# Patient Record
Sex: Female | Born: 1955
Health system: Southern US, Community
[De-identification: ages and names within clinical notes are randomized; demographics above are authoritative.]

## PROBLEM LIST (undated history)

## (undated) DIAGNOSIS — Z9989 Dependence on other enabling machines and devices: Secondary | ICD-10-CM

## (undated) DIAGNOSIS — J45909 Unspecified asthma, uncomplicated: Secondary | ICD-10-CM

## (undated) DIAGNOSIS — E119 Type 2 diabetes mellitus without complications: Secondary | ICD-10-CM

## (undated) DIAGNOSIS — I10 Essential (primary) hypertension: Secondary | ICD-10-CM

## (undated) DIAGNOSIS — I2510 Atherosclerotic heart disease of native coronary artery without angina pectoris: Secondary | ICD-10-CM

## (undated) DIAGNOSIS — M329 Systemic lupus erythematosus, unspecified: Secondary | ICD-10-CM

## (undated) DIAGNOSIS — I495 Sick sinus syndrome: Secondary | ICD-10-CM

## (undated) DIAGNOSIS — I639 Cerebral infarction, unspecified: Secondary | ICD-10-CM

## (undated) DIAGNOSIS — I480 Paroxysmal atrial fibrillation: Secondary | ICD-10-CM

## (undated) DIAGNOSIS — IMO0002 Reserved for concepts with insufficient information to code with codable children: Secondary | ICD-10-CM

## (undated) HISTORY — PX: CHOLECYSTECTOMY: SHX55

## (undated) HISTORY — DX: Sick sinus syndrome: I49.5

## (undated) HISTORY — PX: ATRIAL FIBRILLATION ABLATION: EP1191

---

## 2018-04-28 ENCOUNTER — Ambulatory Visit: Payer: Self-pay | Admitting: Family Medicine

## 2018-05-09 ENCOUNTER — Encounter (HOSPITAL_COMMUNITY): Payer: Self-pay | Admitting: Emergency Medicine

## 2018-05-09 ENCOUNTER — Ambulatory Visit (HOSPITAL_COMMUNITY)
Admission: EM | Admit: 2018-05-09 | Discharge: 2018-05-09 | Disposition: A | Discharge status: Home / Self Care | Payer: Self-pay | Attending: Family Medicine | Admitting: Family Medicine

## 2018-05-09 ENCOUNTER — Other Ambulatory Visit: Payer: Self-pay

## 2018-05-09 DIAGNOSIS — Z760 Encounter for issue of repeat prescription: Secondary | ICD-10-CM

## 2018-05-09 DIAGNOSIS — Z8673 Personal history of transient ischemic attack (TIA), and cerebral infarction without residual deficits: Secondary | ICD-10-CM

## 2018-05-09 DIAGNOSIS — I639 Cerebral infarction, unspecified: Secondary | ICD-10-CM

## 2018-05-09 HISTORY — DX: Reserved for concepts with insufficient information to code with codable children: IMO0002

## 2018-05-09 HISTORY — DX: Systemic lupus erythematosus, unspecified: M32.9

## 2018-05-09 HISTORY — DX: Cerebral infarction, unspecified: I63.9

## 2018-05-09 HISTORY — DX: Dependence on other enabling machines and devices: Z99.89

## 2018-05-09 HISTORY — DX: Type 2 diabetes mellitus without complications: E11.9

## 2018-05-09 HISTORY — DX: Unspecified asthma, uncomplicated: J45.909

## 2018-05-09 HISTORY — DX: Atherosclerotic heart disease of native coronary artery without angina pectoris: I25.10

## 2018-05-09 HISTORY — DX: Essential (primary) hypertension: I10

## 2018-05-09 MED ORDER — APIXABAN 5 MG PO TABS: 5.0000 mg | ORAL_TABLET | Freq: Two times a day (BID) | ORAL | 0 refills | Status: DC

## 2018-05-09 NOTE — ED Triage Notes (Signed)
Pt needs a refill of her Eliquis.  She states she just got out of prison about one month ago and was sent home with a 30 day supply.  She ran out last Thursday and [facility] and they will not be back until next Monday.

## 2018-05-11 NOTE — ED Provider Notes (Signed)
[facility]   [id number] 05/09/18 Arrival Time: 1058  ASSESSMENT & PLAN:  1. Medication refill   2. Cerebrovascular accident (CVA), unspecified mechanism (HCC)     Meds ordered this encounter  Medications  . apixaban (ELIQUIS) 5 MG TABS tablet    Sig: Take 1 tablet (5 mg total) by mouth 2 (two) times daily.    Dispense:  60 tablet    Refill:  0   She plans f/u with a new PCP. Here if needed. After Visit Summary given.   SUBJECTIVE: History from: patient. Stacey Burns is a 62 y.o. female who presents requesting medication refill - Eliquis. Has been taking for years after CVA. Recently released from prison and ran out a few days ago. No current concerns.  Current medical problems include: Past Medical History:  Diagnosis Date  . Asthma   . Coronary artery disease   . CPAP (continuous positive airway pressure) dependence   . Diabetes mellitus without complication (HCC)   . Hypertension   . Lupus (HCC)   . Stroke [facility](HCC)       No current facility-administered medications for this encounter.   Current Outpatient Medications:  .  acetaminophen (TYLENOL) 325 MG tablet, Take 650 mg by mouth every 8 (eight) hours as needed., Disp: , Rfl:  .  azaTHIOprine (IMURAN) 50 MG tablet, Take 175 mg by mouth daily., Disp: , Rfl:  .  baclofen (LIORESAL) 10 MG tablet, Take 10 mg by mouth at bedtime., Disp: , Rfl:  .  capsaicin (ZOSTRIX) 0.025 % cream, Apply topically 2 (two) times daily., Disp: , Rfl:  .  Dimethicone-Zinc Oxide-Vit A-D (A & D ZINC OXIDE) CREA, Apply topically., Disp: , Rfl:  .  furosemide (LASIX) 40 MG tablet, Take 40 mg by mouth daily., Disp: , Rfl:  .  insulin NPH Human (HUMULIN N,NOVOLIN N) 100 UNIT/ML injection, Inject into the skin., Disp: , Rfl:  .  insulin NPH-regular Human (70-30) 100 UNIT/ML injection, Inject into the skin., Disp: , Rfl:  .  lisinopril (PRINIVIL,ZESTRIL) 40 MG tablet, Take 40 mg by mouth daily., Disp: , Rfl:  .  magnesium  oxide (MAG-OX) 400 MG tablet, Take 400 mg by mouth 2 (two) times daily., Disp: , Rfl:  .  metoprolol tartrate (LOPRESSOR) 25 MG tablet, Take 12.5 mg by mouth 2 (two) times daily., Disp: , Rfl:  .  omeprazole (PRILOSEC) 20 MG capsule, Take 20 mg by mouth daily., Disp: , Rfl:  .  tiotropium (SPIRIVA HANDIHALER) 18 MCG inhalation capsule, Place 18 mcg into inhaler and inhale daily., Disp: , Rfl:  .  apixaban (ELIQUIS) 5 MG TABS tablet, Take 1 tablet (5 mg total) by mouth 2 (two) times daily., Disp: 60 tablet, Rfl: 0  ROS: As per HPI.   OBJECTIVE:  Vitals:   05/09/18 1234  BP: 139/60  Pulse: 62  Temp: 98 F (36.7 C)  TempSrc: Oral  SpO2: 97%    General appearance: alert; no distress Eyes: PERRLA; EOMI; conjunctiva normal Lungs: clear to auscultation bilaterally Heart: regular rate and rhythm Skin: warm and dry Neurologic: normal gait; normal symmetric reflexes Psychological: alert and cooperative; normal mood and affect  Labs: No results found for this or any previous visit. Labs Reviewed - No data to display  Allergies  Allergen Reactions  . Penicillins Swelling    Social History   Socioeconomic History  . Marital status: Widowed    Spouse name: Not on file  . Number of children: Not on file  .  Years of education: Not on file  . Highest education level: Not on file  Occupational History  . Not on file  Social Needs  . Financial resource strain: Not on file  . Food insecurity:    Worry: Not on file    Inability: Not on file  . Transportation needs:    Medical: Not on file    Non-medical: Not on file  Tobacco Use  . Smoking status: Never Smoker  . Smokeless tobacco: Never Used  Substance and Sexual Activity  . Alcohol use: Never    Frequency: Never  . Drug use: Never  . Sexual activity: Not on file  Lifestyle  . Physical activity:    Days per week: Not on file    Minutes per session: Not on file  . Stress: Not on file  Relationships  . Social  connections:    Talks on phone: Not on file    Gets together: Not on file    Attends religious service: Not on file    Active member of club or organization: Not on file    Attends meetings of clubs or organizations: Not on file    Relationship status: Not on file  . Intimate partner violence:    Fear of current or ex partner: Not on file    Emotionally abused: Not on file    Physically abused: Not on file    Forced sexual activity: Not on file  Other Topics Concern  . Not on file  Social History Narrative  . Not on file   History reviewed. No pertinent family history. Past Surgical History:  Procedure Laterality Date  . ATRIAL FIBRILLATION ABLATION       Mardella Layman, MD 05/11/18 [phone]

## 2018-07-22 ENCOUNTER — Ambulatory Visit: Payer: Self-pay | Admitting: Family Medicine

## 2018-07-27 ENCOUNTER — Encounter: Payer: Self-pay | Admitting: Family Medicine

## 2018-07-27 ENCOUNTER — Other Ambulatory Visit: Payer: Self-pay

## 2018-07-27 ENCOUNTER — Ambulatory Visit (INDEPENDENT_AMBULATORY_CARE_PROVIDER_SITE_OTHER): Payer: Self-pay | Admitting: Family Medicine

## 2018-07-27 VITALS — BP 124/68 | HR 66 | Temp 98.2°F | Ht 64.0 in | Wt 263.0 lb

## 2018-07-27 DIAGNOSIS — Z760 Encounter for issue of repeat prescription: Secondary | ICD-10-CM

## 2018-07-27 DIAGNOSIS — E119 Type 2 diabetes mellitus without complications: Secondary | ICD-10-CM

## 2018-07-27 DIAGNOSIS — E785 Hyperlipidemia, unspecified: Secondary | ICD-10-CM

## 2018-07-27 DIAGNOSIS — I10 Essential (primary) hypertension: Secondary | ICD-10-CM

## 2018-07-27 DIAGNOSIS — IMO0002 Reserved for concepts with insufficient information to code with codable children: Secondary | ICD-10-CM

## 2018-07-27 DIAGNOSIS — D689 Coagulation defect, unspecified: Secondary | ICD-10-CM

## 2018-07-27 DIAGNOSIS — R7309 Other abnormal glucose: Secondary | ICD-10-CM

## 2018-07-27 DIAGNOSIS — M329 Systemic lupus erythematosus, unspecified: Secondary | ICD-10-CM

## 2018-07-27 LAB — POCT GLYCOSYLATED HEMOGLOBIN (HGB A1C): Hemoglobin A1C: 7.3 % — AB (ref 4.0–5.6)

## 2018-07-27 MED ORDER — AZATHIOPRINE 50 MG PO TABS: 175.0000 mg | ORAL_TABLET | Freq: Every day | ORAL | 0 refills | Status: DC

## 2018-07-27 MED ORDER — LISINOPRIL 40 MG PO TABS: 40.0000 mg | ORAL_TABLET | Freq: Every day | ORAL | 0 refills | Status: DC

## 2018-07-27 MED ORDER — APIXABAN 5 MG PO TABS: 5.0000 mg | ORAL_TABLET | Freq: Two times a day (BID) | ORAL | 0 refills | Status: DC

## 2018-07-27 MED ORDER — INSULIN NPH ISOPHANE & REGULAR (70-30) 100 UNIT/ML ~~LOC~~ SUSP: SUBCUTANEOUS | 0 refills | Status: DC

## 2018-07-27 MED ORDER — FUROSEMIDE 40 MG PO TABS: 40.0000 mg | ORAL_TABLET | Freq: Every day | ORAL | 0 refills | Status: DC

## 2018-07-27 MED ORDER — METOPROLOL TARTRATE 25 MG PO TABS: 12.5000 mg | ORAL_TABLET | Freq: Two times a day (BID) | ORAL | 0 refills | Status: DC

## 2018-07-27 MED ORDER — BACLOFEN 10 MG PO TABS: 10.0000 mg | ORAL_TABLET | Freq: Every day | ORAL | 0 refills | Status: DC

## 2018-07-27 MED ORDER — TIOTROPIUM BROMIDE MONOHYDRATE 18 MCG IN CAPS: 18.0000 ug | ORAL_CAPSULE | Freq: Every day | RESPIRATORY_TRACT | 0 refills | Status: DC

## 2018-07-27 MED ORDER — INSULIN NPH (HUMAN) (ISOPHANE) 100 UNIT/ML ~~LOC~~ SUSP: SUBCUTANEOUS | 0 refills | Status: DC

## 2018-07-27 NOTE — Progress Notes (Signed)
Subjective:    Patient ID: Stacey Burns, female    DOB: [birth year], 63 y.o.   MRN: [id number]   CC: Establish care  HPI: Stacey Burns is a 63 year old female who presented to establish care and have her medications refilled.  Unfortunately, upon arrival discovered her insurance was not accepted at our clinic.  Informed patient that this would be an out-of-pocket cost for the visit and for labs necessary for refilling the medications, she understood and wanted to be seen.   She would like to have all of her medications refilled today since she is about to run out of most of her medications, and is stressed it will take about a month to establish with a new primary care physician.  She had been receiving medications through [facility], however recently obtained insurance and can no longer receive her medications through them.  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  Otherwise, states that she is doing well.  No specific complaints today.  Past medical history:   Diabetes: Checks her CBG 2 times daily, once in the morning and once in evening. Fasting morning glucose: not over 125 most times. Evening: 150's before dinner.  On a rare occasion, her glucoses dropped down to the mid 60s.  She becomes symptomatic and aware, is able to eat something and goes up without a problem.  Currently takes 28 units in the a.m. and 15 units in the p.m. of 70/30.  Has Novolin for a sliding scale that she will take if her CBG is over 150.  CVA: Previous CVA, and patient endorses she has a clotting disorder.  She is not sure which one, but has been told that she will be on Eliquis lifelong.  Congestive heart failure: Unknown EF. States she has had a cardiac ablation for an irregular heartbeat in the past.  Lupus/polyarteritis nodosa: Previously followed with rheumatology.  Taking azathioprine.  Will be  establishing with rheumatology in the future to have this more closely monitored.  Asthma: Longstanding history.  Non-smoker.  Occasional cough.  Hypertension: Well-controlled on lisinopril.  Sleep apnea: CPAP at night   Hyperlipidemia: Reports history of this, no lipid panel to review and is not currently on a statin.  Social history: Divorced, has 1 daughter, 27.  Non-smoker, no recreational drug use, no alcohol use.  Health maintenance: Mammogram and Pap smear in 2019.  Pneumonia vaccine in 2018.  Stress test in 2019.  Smoking status reviewed  Review of Systems Per HPI, also denies recent illness, fever, headache, changes in vision, chest pain, shortness of breath, abdominal pain, N/V/D, weakness   Patient Active Problem List   Diagnosis Date Noted  . Encounter for medication refill 07/27/2018  . Type 2 diabetes mellitus (HCC) 07/27/2018  . Essential hypertension 07/27/2018  . Lupus (HCC) 07/27/2018  . Clotting disorder (HCC) 07/27/2018  . Hyperlipidemia 07/27/2018     Objective:  BP 124/68   Pulse 66   Temp 98.2 F (36.8 C) (Oral)   Ht 5\' 4"  (1.626 m)   Wt 263 lb (119.3 kg)   SpO2 95%   BMI 45.14 kg/m  Vitals and nursing note reviewed  General: NAD, pleasant Cardiac: RRR, normal heart sounds Respiratory: CTAB, normal effort, no wheezing or rhonchi noted  Abdomen: soft large abdomen, nontender, nondistended, normoactive BS  Extremities: No lower extremity swelling noted on exam.  Or cyanosis. WWP. Skin: warm and dry,  no rashes noted Neuro: alert and oriented, no focal deficits Psych: normal affect  Assessment & Plan:   Encounter for medication refill Patient presented to establish care today, however upon arriving discovered her insurance was not accepted by our clinic.  She was informed that the visit and lab work necessary for refilling her medications will be an out-of-pocket cost, she understood and wanted to be seen.  After discussion with patient about  her chronic conditions, refilled her Eliquis, azathioprine, baclofen, Lasix, insulin 70/30, Novolin, lisinopril, Lopressor, and Spiriva.  Additionally obtained a CBC with differential and CMP necessary for monitoring these medications.  Will call patient if any abnormalities arise, and will advise any medication changes that need to be made.  Although she will not be establishing care with us, felt it was important for patient safety to give her a 30-day supply of her hypertension, diabetes, lupus, and anticoagulation therapy/medications. - Discussed case with Dr. Deirdre Priesthambliss and Dr. Pollie MeyerMcIntyre - Patient to establish with new PCP  Type 2 diabetes mellitus (HCC) A1c 7.3 today.  Currently uses 28 units in the a.m. and 15 units in p.m. of 70/30, with additional sliding scale of Novolin.  Tolerates this well without frequent episodes of hypoglycemia.  States she was initially started on this regimen because her diabetes had gotten severely out-of-control.  Discussed with patient that likely in her future could change regimen, consider metformin and GLP-1 agonist for cardiovascular protection given history of CVA and CAD. - Continue monitoring CBGs twice daily - Follow-up with new PCP  Essential hypertension BP 124/68 in the office today.  Well-controlled on lisinopril 40 mg and Lopressor. - Obtain CMP to monitor electrolytes and renal function  Lupus (HCC) Reports history of lupus, chronically on azathioprine. - Recommend establishing with rheumatology - Obtain CBC with differential for drug monitoring  Clotting disorder (HCC) Reports history of clotting disorder, unknown which type.  States she was told she will be on Eliquis lifelong. - Eliquis refill for 30 days as above - CMP to monitor ensure appropriate renal function for clearance  Hyperlipidemia Reported history.  Recommend obtaining lipid panel in the future.  Did not evaluate today due to out-of-pocket cost.  Establish care with new  primary care physician.  May call if any questions or concerns about medications refilled in the meantime.  Leticia PennaSamantha , DO Family Medicine Resident PGY-1

## 2018-07-27 NOTE — Patient Instructions (Signed)
It was wonderful meeting you today, I am sorry we are unable to take your insurance.  I have refilled the majority of your medications.  We have also obtained labs today, we will let you know the results of these shortly.  If anything is abnormal, we will let you know and advise against certain medications depending on the results.  Good luck on finding a primary care physician!  Please let us know if you have any questions or concerns in the meantime!

## 2018-07-27 NOTE — Assessment & Plan Note (Addendum)
BP 124/68 in the office today.  Well-controlled on lisinopril 40 mg and Lopressor. - Obtain CMP to monitor electrolytes and renal function

## 2018-07-27 NOTE — Assessment & Plan Note (Signed)
Patient presented to establish care today, however upon arriving discovered her insurance was not accepted by our clinic.  She was informed that the visit and lab work necessary for refilling her medications will be an out-of-pocket cost, she understood and wanted to be seen.  After discussion with patient about her chronic conditions, refilled her Eliquis, azathioprine, baclofen, Lasix, insulin 70/30, Novolin, lisinopril, Lopressor, and Spiriva.  Additionally obtained a CBC with differential and CMP necessary for monitoring these medications.  Will call patient if any abnormalities arise, and will advise any medication changes that need to be made.  Although she will not be establishing care with [place], felt it was important for patient safety to give her a 30-day supply of her hypertension, diabetes, lupus, and anticoagulation therapy/medications. - Discussed case with Dr. Deirdre Priest and Dr. Pollie Meyer - Patient to establish with new PCP

## 2018-07-27 NOTE — Assessment & Plan Note (Signed)
A1c 7.3 today.  Currently uses 28 units in the a.m. and 15 units in p.m. of 70/30, with additional sliding scale of Novolin.  Tolerates this well without frequent episodes of hypoglycemia.  States she was initially started on this regimen because her diabetes had gotten severely out-of-control.  Discussed with patient that likely in her future could change regimen, consider metformin and GLP-1 agonist for cardiovascular protection given history of CVA and CAD. - Continue monitoring CBGs twice daily - Follow-up with new PCP

## 2018-07-27 NOTE — Assessment & Plan Note (Addendum)
Reports history of clotting disorder, unknown which type.  States she was told she will be on Eliquis lifelong. - Eliquis refill for 30 days as above - CMP to monitor ensure appropriate renal function for clearance

## 2018-07-27 NOTE — Assessment & Plan Note (Signed)
Reports history of lupus, chronically on azathioprine. - Recommend establishing with rheumatology - Obtain CBC with differential for drug monitoring

## 2018-07-27 NOTE — Assessment & Plan Note (Addendum)
Reported history.  Recommend obtaining lipid panel in the future.  Did not evaluate today due to out-of-pocket cost.

## 2018-07-28 ENCOUNTER — Encounter: Payer: Self-pay | Admitting: Family Medicine

## 2018-07-28 LAB — CBC WITH DIFFERENTIAL/PLATELET
BASOS: 0 %
Basophils Absolute: 0 10*3/uL (ref 0.0–0.2)
EOS (ABSOLUTE): 0.1 10*3/uL (ref 0.0–0.4)
EOS: 1 %
HEMOGLOBIN: 13.1 g/dL (ref 11.1–15.9)
Hematocrit: 40.1 % (ref 34.0–46.6)
Immature Grans (Abs): 0 10*3/uL (ref 0.0–0.1)
Immature Granulocytes: 0 %
Lymphocytes Absolute: 1.5 10*3/uL (ref 0.7–3.1)
Lymphs: 25 %
MCH: 28.7 pg (ref 26.6–33.0)
MCHC: 32.7 g/dL (ref 31.5–35.7)
MCV: 88 fL (ref 79–97)
MONOS ABS: 0.6 10*3/uL (ref 0.1–0.9)
Monocytes: 10 %
NEUTROS PCT: 64 %
Neutrophils Absolute: 3.8 10*3/uL (ref 1.4–7.0)
Platelets: 216 10*3/uL (ref 150–450)
RBC: 4.57 x10E6/uL (ref 3.77–5.28)
RDW: 14.6 % (ref 11.7–15.4)
WBC: 5.9 10*3/uL (ref 3.4–10.8)

## 2018-07-28 LAB — COMPREHENSIVE METABOLIC PANEL
ALK PHOS: 101 IU/L (ref 39–117)
ALT: 13 IU/L (ref 0–32)
AST: 19 IU/L (ref 0–40)
Albumin/Globulin Ratio: 0.9 — ABNORMAL LOW (ref 1.2–2.2)
Albumin: 3.4 g/dL — ABNORMAL LOW (ref 3.8–4.8)
BUN/Creatinine Ratio: 21 (ref 12–28)
BUN: 18 mg/dL (ref 8–27)
Bilirubin Total: 0.3 mg/dL (ref 0.0–1.2)
CALCIUM: 8.8 mg/dL (ref 8.7–10.3)
CO2: 24 mmol/L (ref 20–29)
CREATININE: 0.87 mg/dL (ref 0.57–1.00)
Chloride: 105 mmol/L (ref 96–106)
GFR calc Af Amer: 83 mL/min/{1.73_m2} (ref >59)
GFR, EST NON AFRICAN AMERICAN: 72 mL/min/{1.73_m2} (ref >59)
GLUCOSE: 127 mg/dL — AB (ref 65–99)
Globulin, Total: 3.6 g/dL (ref 1.5–4.5)
Potassium: 4.7 mmol/L (ref 3.5–5.2)
Sodium: 141 mmol/L (ref 134–144)
Total Protein: 7 g/dL (ref 6.0–8.5)

## 2018-07-29 ENCOUNTER — Other Ambulatory Visit: Payer: Self-pay | Admitting: Family Medicine

## 2018-07-29 ENCOUNTER — Telehealth: Payer: Self-pay

## 2018-07-29 ENCOUNTER — Telehealth: Payer: Self-pay | Admitting: *Deleted

## 2018-07-29 DIAGNOSIS — M329 Systemic lupus erythematosus, unspecified: Secondary | ICD-10-CM

## 2018-07-29 MED ORDER — LISINOPRIL 40 MG PO TABS: 40.0000 mg | ORAL_TABLET | Freq: Every day | ORAL | 0 refills | Status: DC

## 2018-07-29 MED ORDER — AZATHIOPRINE 50 MG PO TABS: 175.0000 mg | ORAL_TABLET | Freq: Every day | ORAL | 0 refills | Status: DC

## 2018-07-29 MED ORDER — INSULIN NPH (HUMAN) (ISOPHANE) 100 UNIT/ML ~~LOC~~ SUSP: SUBCUTANEOUS | 0 refills | Status: DC

## 2018-07-29 MED ORDER — BACLOFEN 10 MG PO TABS: 10.0000 mg | ORAL_TABLET | Freq: Every day | ORAL | 0 refills | Status: DC

## 2018-07-29 MED ORDER — FUROSEMIDE 40 MG PO TABS: 40.0000 mg | ORAL_TABLET | Freq: Every day | ORAL | 0 refills | Status: DC

## 2018-07-29 MED ORDER — TIOTROPIUM BROMIDE MONOHYDRATE 18 MCG IN CAPS: 18.0000 ug | ORAL_CAPSULE | Freq: Every day | RESPIRATORY_TRACT | 0 refills | Status: DC

## 2018-07-29 MED ORDER — METOPROLOL TARTRATE 25 MG PO TABS: 12.5000 mg | ORAL_TABLET | Freq: Two times a day (BID) | ORAL | 0 refills | Status: DC

## 2018-07-29 MED ORDER — INSULIN NPH ISOPHANE & REGULAR (70-30) 100 UNIT/ML ~~LOC~~ SUSP: SUBCUTANEOUS | 0 refills | Status: DC

## 2018-07-29 NOTE — Progress Notes (Signed)
Visit with patient on 2/12, refilled her medications electronically with exception of Eliquis that was printed.  Informed today that she needed them printed instead because her medications have to go through a "prision process" in order for her to receive them.  Will reorder and print prescriptions, will leave at the front desk for her son to pick them up next week.  Allayne Stack, DO   Meds ordered this encounter  Medications  . azaTHIOprine (IMURAN) 50 MG tablet    Sig: Take 3.5 tablets (175 mg total) by mouth daily.    Dispense:  105 tablet    Refill:  0  . baclofen (LIORESAL) 10 MG tablet    Sig: Take 1 tablet (10 mg total) by mouth at bedtime.    Dispense:  30 each    Refill:  0  . furosemide (LASIX) 40 MG tablet    Sig: Take 1 tablet (40 mg total) by mouth daily.    Dispense:  30 tablet    Refill:  0  . insulin NPH Human (HUMULIN N,NOVOLIN N) 100 UNIT/ML injection    Sig: Inject into skin, sliding scale.    Dispense:  10 mL    Refill:  0  . insulin NPH-regular Human (70-30) 100 UNIT/ML injection    Sig: 28 units with breakfast, 15 units with dinner    Dispense:  10 mL    Refill:  0  . lisinopril (PRINIVIL,ZESTRIL) 40 MG tablet    Sig: Take 1 tablet (40 mg total) by mouth daily.    Dispense:  30 tablet    Refill:  0  . metoprolol tartrate (LOPRESSOR) 25 MG tablet    Sig: Take 0.5 tablets (12.5 mg total) by mouth 2 (two) times daily.    Dispense:  30 tablet    Refill:  0  . tiotropium (SPIRIVA HANDIHALER) 18 MCG inhalation capsule    Sig: Place 1 capsule (18 mcg total) into inhaler and inhale daily.    Dispense:  30 capsule    Refill:  0

## 2018-07-29 NOTE — Telephone Encounter (Signed)
[facility] on [address] calls for clarification on Humulin N.  They need parameters for the sliding scale for insurance purposes.  Hanadi Stanly, Maryjo [place], CMA

## 2018-07-29 NOTE — Telephone Encounter (Signed)
Printed prescriptions, will bring them to front for patient's son to pick up next week.   Thank you!   Allayne Stack, DO

## 2018-07-29 NOTE — Telephone Encounter (Signed)
Pt called nurse line stating the medications prescribed to her at last visit need to be printed in order to be filled. Pt is on house arrest and her medications have to go through the "prisions process" in order for her to get them. Pt will have her son come by next week to pick them up. Please advise.

## 2018-08-01 ENCOUNTER — Other Ambulatory Visit: Payer: Self-pay

## 2018-08-01 NOTE — Telephone Encounter (Signed)
Also needs printed Rx for acetaminophen 325 mg tablets.  Ples Specter, RN [facility] RN)

## 2018-08-01 NOTE — Telephone Encounter (Signed)
I printed her prescriptions and put them at the front on Friday 2/14 (within the folder). I will add these additional prescriptions this afternoon when I am able to get to the clinic.   Allayne Stack, DO

## 2018-08-01 NOTE — Telephone Encounter (Signed)
Patient called. Needs all her prescriptions as printed prescriptions because they have to be taken to the [employer].  Please print all prescriptions and put up front. Patient is OUT of insulin NPH.  In addition, needs Magnesium Oxide, Precision Xtra glucose test strips, Insulin syringe with needles (Ulti-Care syr with short needles) also in printed Rx.  Call back is [phone]  Ples Specter, RN [facility] RN)

## 2018-08-02 ENCOUNTER — Other Ambulatory Visit: Payer: Self-pay | Admitting: Family Medicine

## 2018-08-02 DIAGNOSIS — M329 Systemic lupus erythematosus, unspecified: Secondary | ICD-10-CM

## 2018-08-02 MED ORDER — "INSULIN SYRINGE-NEEDLE U-100 30G X 5/16"" 0.3 ML MISC": 2.0000 | Freq: Two times a day (BID) | 2 refills | Status: AC

## 2018-08-02 MED ORDER — OMEPRAZOLE 20 MG PO CPDR: 20.0000 mg | DELAYED_RELEASE_CAPSULE | Freq: Every day | ORAL | 1 refills | Status: DC

## 2018-08-02 MED ORDER — INSULIN NPH ISOPHANE & REGULAR (70-30) 100 UNIT/ML ~~LOC~~ SUSP: SUBCUTANEOUS | 2 refills | Status: AC

## 2018-08-02 MED ORDER — ACETAMINOPHEN 325 MG PO TABS: 650.0000 mg | ORAL_TABLET | Freq: Three times a day (TID) | ORAL | 0 refills | Status: AC | PRN

## 2018-08-02 MED ORDER — METOPROLOL TARTRATE 25 MG PO TABS: 12.5000 mg | ORAL_TABLET | Freq: Two times a day (BID) | ORAL | 1 refills | Status: DC

## 2018-08-02 MED ORDER — BACLOFEN 10 MG PO TABS: 10.0000 mg | ORAL_TABLET | Freq: Every day | ORAL | 1 refills | Status: DC

## 2018-08-02 MED ORDER — TIOTROPIUM BROMIDE MONOHYDRATE 18 MCG IN CAPS: 18.0000 ug | ORAL_CAPSULE | Freq: Every day | RESPIRATORY_TRACT | 1 refills | Status: DC

## 2018-08-02 MED ORDER — INSULIN NPH (HUMAN) (ISOPHANE) 100 UNIT/ML ~~LOC~~ SUSP: SUBCUTANEOUS | 0 refills | Status: DC

## 2018-08-02 MED ORDER — AZATHIOPRINE 50 MG PO TABS: 175.0000 mg | ORAL_TABLET | Freq: Every day | ORAL | 1 refills | Status: AC

## 2018-08-02 MED ORDER — GLUCOSE BLOOD VI STRP: ORAL_STRIP | 12 refills | Status: AC

## 2018-08-02 MED ORDER — FUROSEMIDE 40 MG PO TABS: 40.0000 mg | ORAL_TABLET | Freq: Every day | ORAL | 1 refills | Status: DC

## 2018-08-02 MED ORDER — LISINOPRIL 40 MG PO TABS: 40.0000 mg | ORAL_TABLET | Freq: Every day | ORAL | 1 refills | Status: AC

## 2018-08-02 MED ORDER — MAGNESIUM OXIDE 400 MG PO TABS: 400.0000 mg | ORAL_TABLET | Freq: Two times a day (BID) | ORAL | 1 refills | Status: AC

## 2018-08-02 NOTE — Progress Notes (Signed)
Printed out majority of patient's prescriptions last week, did not pick up.  However patient states now she has scheduled a new primary care doctor visit in April, requesting a 30-month supply.  Additionally wanted prescription for Tylenol, Mag-Ox, glucose strips, and insulin syringes/needles.  Have put these up in the front.  Reordered and printed off prescriptions, for a 77-month supply.  I will not be refilling any additional medications that are not already on her stated med list.  Allayne Stack, DO  Family Medicine PGY-1   Meds ordered this encounter  Medications  . acetaminophen (TYLENOL) 325 MG tablet    Sig: Take 2 tablets (650 mg total) by mouth every 8 (eight) hours as needed for up to 30 days.    Dispense:  45 tablet    Refill:  0  . azaTHIOprine (IMURAN) 50 MG tablet    Sig: Take 3.5 tablets (175 mg total) by mouth daily.    Dispense:  105 tablet    Refill:  1  . baclofen (LIORESAL) 10 MG tablet    Sig: Take 1 tablet (10 mg total) by mouth at bedtime.    Dispense:  30 each    Refill:  1  . furosemide (LASIX) 40 MG tablet    Sig: Take 1 tablet (40 mg total) by mouth daily.    Dispense:  30 tablet    Refill:  1  . insulin NPH Human (HUMULIN N,NOVOLIN N) 100 UNIT/ML injection    Sig: Inject into skin, sliding scale.  <150 0 U  150-199: 2U  200-249: 4 U 250-299: 6 U  300-349: 8 U  350-400: 10 U  > 400 call doc    Dispense:  10 mL    Refill:  0  . insulin NPH-regular Human (70-30) 100 UNIT/ML injection    Sig: 28 units with breakfast, 15 units with dinner    Dispense:  10 mL    Refill:  2  . lisinopril (PRINIVIL,ZESTRIL) 40 MG tablet    Sig: Take 1 tablet (40 mg total) by mouth daily.    Dispense:  30 tablet    Refill:  1  . magnesium oxide (MAG-OX) 400 MG tablet    Sig: Take 1 tablet (400 mg total) by mouth 2 (two) times daily.    Dispense:  60 tablet    Refill:  1  . metoprolol tartrate (LOPRESSOR) 25 MG tablet    Sig: Take 0.5 tablets (12.5 mg total) by  mouth 2 (two) times daily.    Dispense:  30 tablet    Refill:  1  . omeprazole (PRILOSEC) 20 MG capsule    Sig: Take 1 capsule (20 mg total) by mouth daily.    Dispense:  30 capsule    Refill:  1  . tiotropium (SPIRIVA HANDIHALER) 18 MCG inhalation capsule    Sig: Place 1 capsule (18 mcg total) into inhaler and inhale daily.    Dispense:  30 capsule    Refill:  1  . glucose blood test strip    Sig: Use as instructed    Dispense:  100 each    Refill:  12  . Insulin Syringe-Needle U-100 30G X 5/16" 0.3 ML MISC    Sig: 2 each by Does not apply route 2 (two) times daily.    Dispense:  100 each    Refill:  2    Needs needles + syringe for NPH and Humulin, please prescribe appropriate size.

## 2018-08-02 NOTE — Telephone Encounter (Signed)
Please see orders only note.  

## 2018-08-02 NOTE — Telephone Encounter (Signed)
Patient aware that prescriptions are ready for pick up.  Ples Specter, RN [facility] RN)

## 2018-08-02 NOTE — Telephone Encounter (Signed)
Pt calls back to check the status.  She also request a 2 month supply as her new provider cant see her till April.  Dr. Annia Friendly contacted, she will be by At lunch today.  Pt request a callback after scripts are written. Ken Bonn, Maryjo [place], CMA

## 2018-08-29 ENCOUNTER — Other Ambulatory Visit: Payer: Self-pay

## 2018-08-29 MED ORDER — APIXABAN 5 MG PO TABS: 5.0000 mg | ORAL_TABLET | Freq: Two times a day (BID) | ORAL | 0 refills | Status: DC

## 2019-01-15 ENCOUNTER — Encounter (HOSPITAL_COMMUNITY): Payer: Self-pay | Admitting: *Deleted

## 2019-01-15 ENCOUNTER — Inpatient Hospital Stay (HOSPITAL_COMMUNITY)
Admission: EM | Admit: 2019-01-15 | Discharge: 2019-01-27 | DRG: 177 | Disposition: A | Discharge status: Home Health | Attending: Internal Medicine | Admitting: Internal Medicine

## 2019-01-15 ENCOUNTER — Other Ambulatory Visit: Payer: Self-pay

## 2019-01-15 ENCOUNTER — Emergency Department (HOSPITAL_COMMUNITY)

## 2019-01-15 DIAGNOSIS — Z9989 Dependence on other enabling machines and devices: Secondary | ICD-10-CM

## 2019-01-15 DIAGNOSIS — I10 Essential (primary) hypertension: Secondary | ICD-10-CM | POA: Insufficient documentation

## 2019-01-15 DIAGNOSIS — I110 Hypertensive heart disease with heart failure: Secondary | ICD-10-CM | POA: Diagnosis not present

## 2019-01-15 DIAGNOSIS — I480 Paroxysmal atrial fibrillation: Secondary | ICD-10-CM | POA: Diagnosis present

## 2019-01-15 DIAGNOSIS — M793 Panniculitis, unspecified: Secondary | ICD-10-CM | POA: Diagnosis present

## 2019-01-15 DIAGNOSIS — J1282 Pneumonia due to coronavirus disease 2019: Secondary | ICD-10-CM

## 2019-01-15 DIAGNOSIS — Z7901 Long term (current) use of anticoagulants: Secondary | ICD-10-CM | POA: Diagnosis not present

## 2019-01-15 DIAGNOSIS — E785 Hyperlipidemia, unspecified: Secondary | ICD-10-CM | POA: Diagnosis present

## 2019-01-15 DIAGNOSIS — Z880 Allergy status to penicillin: Secondary | ICD-10-CM

## 2019-01-15 DIAGNOSIS — U071 COVID-19: Secondary | ICD-10-CM | POA: Diagnosis not present

## 2019-01-15 DIAGNOSIS — J9601 Acute respiratory failure with hypoxia: Secondary | ICD-10-CM | POA: Diagnosis not present

## 2019-01-15 DIAGNOSIS — E119 Type 2 diabetes mellitus without complications: Secondary | ICD-10-CM | POA: Diagnosis present

## 2019-01-15 DIAGNOSIS — I5032 Chronic diastolic (congestive) heart failure: Secondary | ICD-10-CM | POA: Diagnosis not present

## 2019-01-15 DIAGNOSIS — Z803 Family history of malignant neoplasm of breast: Secondary | ICD-10-CM | POA: Diagnosis not present

## 2019-01-15 DIAGNOSIS — Z8673 Personal history of transient ischemic attack (TIA), and cerebral infarction without residual deficits: Secondary | ICD-10-CM

## 2019-01-15 DIAGNOSIS — Z79899 Other long term (current) drug therapy: Secondary | ICD-10-CM

## 2019-01-15 DIAGNOSIS — Z823 Family history of stroke: Secondary | ICD-10-CM

## 2019-01-15 DIAGNOSIS — D689 Coagulation defect, unspecified: Secondary | ICD-10-CM | POA: Diagnosis not present

## 2019-01-15 DIAGNOSIS — J440 Chronic obstructive pulmonary disease with acute lower respiratory infection: Secondary | ICD-10-CM | POA: Diagnosis present

## 2019-01-15 DIAGNOSIS — I4892 Unspecified atrial flutter: Secondary | ICD-10-CM | POA: Diagnosis not present

## 2019-01-15 DIAGNOSIS — E669 Obesity, unspecified: Secondary | ICD-10-CM | POA: Diagnosis not present

## 2019-01-15 DIAGNOSIS — W19XXXA Unspecified fall, initial encounter: Secondary | ICD-10-CM | POA: Diagnosis present

## 2019-01-15 DIAGNOSIS — I639 Cerebral infarction, unspecified: Secondary | ICD-10-CM | POA: Insufficient documentation

## 2019-01-15 DIAGNOSIS — Z6841 Body Mass Index (BMI) 40.0 and over, adult: Secondary | ICD-10-CM | POA: Diagnosis not present

## 2019-01-15 DIAGNOSIS — J1289 Other viral pneumonia: Secondary | ICD-10-CM | POA: Diagnosis not present

## 2019-01-15 DIAGNOSIS — M329 Systemic lupus erythematosus, unspecified: Secondary | ICD-10-CM | POA: Diagnosis present

## 2019-01-15 DIAGNOSIS — I2510 Atherosclerotic heart disease of native coronary artery without angina pectoris: Secondary | ICD-10-CM | POA: Diagnosis not present

## 2019-01-15 DIAGNOSIS — R0602 Shortness of breath: Secondary | ICD-10-CM

## 2019-01-15 DIAGNOSIS — K5730 Diverticulosis of large intestine without perforation or abscess without bleeding: Secondary | ICD-10-CM | POA: Diagnosis not present

## 2019-01-15 DIAGNOSIS — G4733 Obstructive sleep apnea (adult) (pediatric): Secondary | ICD-10-CM | POA: Diagnosis not present

## 2019-01-15 DIAGNOSIS — Z794 Long term (current) use of insulin: Secondary | ICD-10-CM | POA: Diagnosis not present

## 2019-01-15 DIAGNOSIS — R001 Bradycardia, unspecified: Secondary | ICD-10-CM | POA: Diagnosis not present

## 2019-01-15 DIAGNOSIS — J45909 Unspecified asthma, uncomplicated: Secondary | ICD-10-CM | POA: Insufficient documentation

## 2019-01-15 HISTORY — DX: Paroxysmal atrial fibrillation: I48.0

## 2019-01-15 LAB — CBC WITH DIFFERENTIAL/PLATELET
Abs Immature Granulocytes: 0.02 10*3/uL (ref 0.00–0.07)
Basophils Absolute: 0 10*3/uL (ref 0.0–0.1)
Basophils Relative: 0 %
Eosinophils Absolute: 0 10*3/uL (ref 0.0–0.5)
Eosinophils Relative: 0 %
HCT: 43.9 % (ref 36.0–46.0)
Hemoglobin: 14.1 g/dL (ref 12.0–15.0)
Immature Granulocytes: 0 %
Lymphocytes Relative: 11 %
Lymphs Abs: 0.6 10*3/uL — ABNORMAL LOW (ref 0.7–4.0)
MCH: 29.2 pg (ref 26.0–34.0)
MCHC: 32.1 g/dL (ref 30.0–36.0)
MCV: 90.9 fL (ref 80.0–100.0)
Monocytes Absolute: 0.5 10*3/uL (ref 0.1–1.0)
Monocytes Relative: 9 %
Neutro Abs: 4.5 10*3/uL (ref 1.7–7.7)
Neutrophils Relative %: 80 %
Platelets: 199 10*3/uL (ref 150–400)
RBC: 4.83 MIL/uL (ref 3.87–5.11)
RDW: 16.7 % — ABNORMAL HIGH (ref 11.5–15.5)
WBC: 5.7 10*3/uL (ref 4.0–10.5)
nRBC: 0 % (ref 0.0–0.2)

## 2019-01-15 LAB — COMPREHENSIVE METABOLIC PANEL
ALT: 21 U/L (ref 0–44)
AST: 40 U/L (ref 15–41)
Albumin: 2.5 g/dL — ABNORMAL LOW (ref 3.5–5.0)
Alkaline Phosphatase: 47 U/L (ref 38–126)
Anion gap: 9 (ref 5–15)
BUN: 10 mg/dL (ref 8–23)
CO2: 24 mmol/L (ref 22–32)
Calcium: 8.1 mg/dL — ABNORMAL LOW (ref 8.9–10.3)
Chloride: 104 mmol/L (ref 98–111)
Creatinine, Ser: 0.89 mg/dL (ref 0.44–1.00)
GFR calc Af Amer: >60 mL/min (ref >60)
GFR calc non Af Amer: >60 mL/min (ref >60)
Glucose, Bld: 108 mg/dL — ABNORMAL HIGH (ref 70–99)
Potassium: 3.7 mmol/L (ref 3.5–5.1)
Sodium: 137 mmol/L (ref 135–145)
Total Bilirubin: 0.9 mg/dL (ref 0.3–1.2)
Total Protein: 7.1 g/dL (ref 6.5–8.1)

## 2019-01-15 LAB — PROCALCITONIN: Procalcitonin: 0.29 ng/mL

## 2019-01-15 LAB — FERRITIN: Ferritin: 143 ng/mL (ref 11–307)

## 2019-01-15 LAB — ABO/RH: ABO/RH(D): B POS

## 2019-01-15 LAB — TROPONIN I (HIGH SENSITIVITY)
Troponin I (High Sensitivity): 7 ng/L (ref <18)
Troponin I (High Sensitivity): 7 ng/L (ref <18)

## 2019-01-15 LAB — D-DIMER, QUANTITATIVE: D-Dimer, Quant: 0.33 ug/mL-FEU (ref 0.00–0.50)

## 2019-01-15 LAB — LACTIC ACID, PLASMA
Lactic Acid, Venous: 1.5 mmol/L (ref 0.5–1.9)
Lactic Acid, Venous: 3.9 mmol/L (ref 0.5–1.9)

## 2019-01-15 LAB — SARS CORONAVIRUS 2 BY RT PCR (HOSPITAL ORDER, PERFORMED IN ~~LOC~~ HOSPITAL LAB): SARS Coronavirus 2: POSITIVE — AB

## 2019-01-15 LAB — FIBRINOGEN: Fibrinogen: 735 mg/dL — ABNORMAL HIGH (ref 210–475)

## 2019-01-15 LAB — HEMOGLOBIN A1C
Hgb A1c MFr Bld: 7.1 % — ABNORMAL HIGH (ref 4.8–5.6)
Mean Plasma Glucose: 157.07 mg/dL

## 2019-01-15 LAB — CBG MONITORING, ED: Glucose-Capillary: 173 mg/dL — ABNORMAL HIGH (ref 70–99)

## 2019-01-15 LAB — LIPASE, BLOOD: Lipase: 43 U/L (ref 11–51)

## 2019-01-15 LAB — TRIGLYCERIDES: Triglycerides: 91 mg/dL (ref <150)

## 2019-01-15 LAB — LACTATE DEHYDROGENASE: LDH: 333 U/L — ABNORMAL HIGH (ref 98–192)

## 2019-01-15 LAB — C-REACTIVE PROTEIN: CRP: 16.9 mg/dL — ABNORMAL HIGH (ref <1.0)

## 2019-01-15 MED ORDER — INSULIN NPH (HUMAN) (ISOPHANE) 100 UNIT/ML ~~LOC~~ SUSP
2.0000 [IU] | Freq: Two times a day (BID) | SUBCUTANEOUS | Status: DC
  Administered 2019-01-15 – 2019-01-16 (×2): 2 [IU] via SUBCUTANEOUS
  Filled 2019-01-15: qty 10

## 2019-01-15 MED ORDER — IOHEXOL 350 MG/ML SOLN
100.0000 mL | Freq: Once | INTRAVENOUS | Status: AC | PRN
  Administered 2019-01-15: 100 mL via INTRAVENOUS

## 2019-01-15 MED ORDER — SODIUM CHLORIDE 0.9% FLUSH: 3.0000 mL | INTRAVENOUS | Status: DC | PRN

## 2019-01-15 MED ORDER — INSULIN ASPART 100 UNIT/ML ~~LOC~~ SOLN
0.0000 [IU] | Freq: Every day | SUBCUTANEOUS | Status: DC
  Administered 2019-01-16: 5 [IU] via SUBCUTANEOUS
  Administered 2019-01-17 – 2019-01-19 (×3): 2 [IU] via SUBCUTANEOUS
  Administered 2019-01-20: 5 [IU] via SUBCUTANEOUS

## 2019-01-15 MED ORDER — TIOTROPIUM BROMIDE MONOHYDRATE 18 MCG IN CAPS: 18.0000 ug | ORAL_CAPSULE | Freq: Every day | RESPIRATORY_TRACT | Status: DC

## 2019-01-15 MED ORDER — ALBUTEROL SULFATE HFA 108 (90 BASE) MCG/ACT IN AERS
2.0000 | INHALATION_SPRAY | Freq: Four times a day (QID) | RESPIRATORY_TRACT | Status: DC | PRN
  Administered 2019-01-15: 2 via RESPIRATORY_TRACT
  Filled 2019-01-15: qty 6.7

## 2019-01-15 MED ORDER — SODIUM CHLORIDE 0.9% FLUSH
3.0000 mL | Freq: Two times a day (BID) | INTRAVENOUS | Status: DC
  Administered 2019-01-16 – 2019-01-26 (×22): 3 mL via INTRAVENOUS

## 2019-01-15 MED ORDER — SODIUM CHLORIDE 0.9 % IV BOLUS
1000.0000 mL | Freq: Once | INTRAVENOUS | Status: AC
  Administered 2019-01-15: 1000 mL via INTRAVENOUS

## 2019-01-15 MED ORDER — SODIUM CHLORIDE 0.9 % IV BOLUS: 500.0000 mL | Freq: Once | INTRAVENOUS | Status: DC

## 2019-01-15 MED ORDER — LORAZEPAM 2 MG/ML IJ SOLN
0.5000 mg | Freq: Once | INTRAMUSCULAR | Status: AC
  Administered 2019-01-15: 0.5 mg via INTRAVENOUS
  Filled 2019-01-15: qty 1

## 2019-01-15 MED ORDER — INSULIN ASPART 100 UNIT/ML ~~LOC~~ SOLN
0.0000 [IU] | Freq: Three times a day (TID) | SUBCUTANEOUS | Status: DC
  Administered 2019-01-16: 3 [IU] via SUBCUTANEOUS
  Administered 2019-01-17: 5 [IU] via SUBCUTANEOUS
  Administered 2019-01-17 (×2): 3 [IU] via SUBCUTANEOUS
  Administered 2019-01-18: 8 [IU] via SUBCUTANEOUS
  Administered 2019-01-18: 5 [IU] via SUBCUTANEOUS
  Administered 2019-01-18: 3 [IU] via SUBCUTANEOUS
  Administered 2019-01-19 (×2): 8 [IU] via SUBCUTANEOUS
  Administered 2019-01-19: 11 [IU] via SUBCUTANEOUS
  Administered 2019-01-20: 15 [IU] via SUBCUTANEOUS
  Administered 2019-01-20: 8 [IU] via SUBCUTANEOUS
  Administered 2019-01-20: 11 [IU] via SUBCUTANEOUS
  Administered 2019-01-21: 15 [IU] via SUBCUTANEOUS

## 2019-01-15 MED ORDER — ZOLPIDEM TARTRATE 5 MG PO TABS
5.0000 mg | ORAL_TABLET | Freq: Every evening | ORAL | Status: DC | PRN
  Administered 2019-01-15: 5 mg via ORAL
  Filled 2019-01-15: qty 1

## 2019-01-15 MED ORDER — OXYCODONE HCL 5 MG PO TABS
5.0000 mg | ORAL_TABLET | ORAL | Status: DC | PRN
  Administered 2019-01-15: 5 mg via ORAL
  Filled 2019-01-15: qty 1

## 2019-01-15 MED ORDER — GUAIFENESIN-DM 100-10 MG/5ML PO SYRP
10.0000 mL | ORAL_SOLUTION | ORAL | Status: DC | PRN
  Administered 2019-01-15 – 2019-01-26 (×7): 10 mL via ORAL
  Filled 2019-01-15 (×7): qty 10

## 2019-01-15 MED ORDER — SODIUM CHLORIDE 0.9 % IV SOLN: 250.0000 mL | INTRAVENOUS | Status: DC | PRN

## 2019-01-15 MED ORDER — SEMAGLUTIDE(0.25 OR 0.5MG/DOS) 2 MG/1.5ML ~~LOC~~ SOPN: 0.2000 mL | PEN_INJECTOR | SUBCUTANEOUS | Status: DC

## 2019-01-15 MED ORDER — FUROSEMIDE 20 MG PO TABS
20.0000 mg | ORAL_TABLET | Freq: Every day | ORAL | Status: DC
  Administered 2019-01-15: 20 mg via ORAL
  Filled 2019-01-15: qty 1

## 2019-01-15 MED ORDER — ACETAMINOPHEN 325 MG PO TABS
650.0000 mg | ORAL_TABLET | Freq: Once | ORAL | Status: AC
  Administered 2019-01-15: 650 mg via ORAL
  Filled 2019-01-15: qty 2

## 2019-01-15 MED ORDER — APIXABAN 5 MG PO TABS
5.0000 mg | ORAL_TABLET | Freq: Two times a day (BID) | ORAL | Status: DC
  Administered 2019-01-16 – 2019-01-27 (×24): 5 mg via ORAL
  Filled 2019-01-15 (×25): qty 1

## 2019-01-15 MED ORDER — SODIUM CHLORIDE 0.9% FLUSH: 3.0000 mL | Freq: Two times a day (BID) | INTRAVENOUS | Status: DC

## 2019-01-15 MED ORDER — HYDROCOD POLST-CPM POLST ER 10-8 MG/5ML PO SUER
5.0000 mL | Freq: Two times a day (BID) | ORAL | Status: DC | PRN
  Administered 2019-01-16: 5 mL via ORAL
  Filled 2019-01-15: qty 5

## 2019-01-15 MED ORDER — ACETAMINOPHEN 325 MG PO TABS
650.0000 mg | ORAL_TABLET | Freq: Four times a day (QID) | ORAL | Status: DC | PRN
  Administered 2019-01-15: 650 mg via ORAL
  Filled 2019-01-15: qty 2

## 2019-01-15 MED ORDER — BACLOFEN 10 MG PO TABS
10.0000 mg | ORAL_TABLET | Freq: Every day | ORAL | Status: DC
  Administered 2019-01-16 – 2019-01-26 (×12): 10 mg via ORAL
  Filled 2019-01-15 (×14): qty 1

## 2019-01-15 MED ORDER — ONDANSETRON HCL 4 MG/2ML IJ SOLN: 4.0000 mg | Freq: Four times a day (QID) | INTRAMUSCULAR | Status: DC | PRN

## 2019-01-15 MED ORDER — ONDANSETRON HCL 4 MG PO TABS
4.0000 mg | ORAL_TABLET | Freq: Four times a day (QID) | ORAL | Status: DC | PRN
  Administered 2019-01-15: 4 mg via ORAL
  Filled 2019-01-15: qty 1

## 2019-01-15 MED ORDER — AZATHIOPRINE 50 MG PO TABS
175.0000 mg | ORAL_TABLET | Freq: Every morning | ORAL | Status: DC
  Filled 2019-01-15: qty 4

## 2019-01-15 MED ORDER — MORPHINE SULFATE (PF) 2 MG/ML IV SOLN
2.0000 mg | Freq: Once | INTRAVENOUS | Status: AC
  Administered 2019-01-15: 2 mg via INTRAVENOUS
  Filled 2019-01-15: qty 1

## 2019-01-15 MED ORDER — UMECLIDINIUM BROMIDE 62.5 MCG/INH IN AEPB
1.0000 | INHALATION_SPRAY | Freq: Every day | RESPIRATORY_TRACT | Status: DC
  Administered 2019-01-16 – 2019-01-27 (×12): 1 via RESPIRATORY_TRACT
  Filled 2019-01-15 (×2): qty 7

## 2019-01-15 NOTE — ED Provider Notes (Signed)
[facility] EMERGENCY DEPARTMENT Provider Note   CSN: [id number] Arrival date & time:       History   Chief Complaint Chief Complaint  Patient presents with   Chest Pain    HPI Stacey Burns is a 63 y.o. female with history of asthma, CAD, diabetes mellitus, hypertension, lupus, CVA, clotting disorder, hyperlipidemia, paroxysmal A. fib/a flutter presents for evaluation of acute onset, progressively worsening shortness of breath, chest pains, abdominal pain for 1 week secondary to fall.  She reports that 1 week ago she fell while ambulating.  She denies any prodrome leading up to the fall including shortness of breath, chest pain, or lightheadedness.  She reports that she landed on her right side, no head injury or loss of consciousness.  Headaches or vision changes since then.  Since the fall she has had shortness of breath at rest, significant dyspnea on exertion, and exertional chest pain.  She has a hard time describing the pain but reports it is substernal and occurs with any sort of exertion or even ambulation.  She has had a few episodes of nonbilious emesis with mild blood streaking this week.  Also reports generalized abdominal pain since the fall, worse on the right side.  Reports intermittent palpitations.  She experienced chest pain just prior to arrival and took a sublingual nitroglycerin with resolution of her pain from 8/10 in severity to a 0/10.  She is currently anticoagulated on Eliquis, reports she has been compliant with all of her home medications.     The history is provided by the patient.    Past Medical History:  Diagnosis Date   Asthma    Coronary artery disease    CPAP (continuous positive airway pressure) dependence    Diabetes mellitus without complication (HCC)    Hypertension    Lupus (HCC)    Paroxysmal atrial fibrillation (HCC)    Stroke [facility])     Patient Active Problem List   Diagnosis Date Noted   Pneumonia  due to COVID-19 virus 01/15/2019   CPAP (continuous positive airway pressure) dependence    Coronary artery disease    Asthma    Hypertension    Paroxysmal atrial fibrillation (HCC)    Stroke (HCC)    Encounter for medication refill 07/27/2018   Type 2 diabetes mellitus (HCC) 07/27/2018   Essential hypertension 07/27/2018   Lupus (HCC) 07/27/2018   Clotting disorder (HCC) 07/27/2018   Hyperlipidemia 07/27/2018    Past Surgical History:  Procedure Laterality Date   ATRIAL FIBRILLATION ABLATION       OB History   No obstetric history on file.      Home Medications    Prior to Admission medications   Medication Sig Start Date End Date Taking? Authorizing Provider  acetaminophen (TYLENOL) 325 MG tablet Take 325 mg by mouth every 6 (six) hours as needed for pain. 01/11/19  Yes [provider]  albuterol (VENTOLIN HFA) 108 (90 Base) MCG/ACT inhaler Inhale 2 puffs into the lungs every 6 (six) hours as needed for wheezing or shortness of breath.   Yes [provider]  apixaban (ELIQUIS) 5 MG TABS tablet Take 1 tablet (5 mg total) by mouth 2 (two) times daily. 08/29/18  Yes Allayne Stack, DO  azaTHIOprine (IMURAN) 50 MG tablet Take 175 mg by mouth every morning. 01/11/19  Yes [provider]  baclofen (LIORESAL) 10 MG tablet Take 1 tablet (10 mg total) by mouth at bedtime. 08/02/18  Yes Leticia Penna  N, DO  furosemide (LASIX) 40 MG tablet Take 1 tablet (40 mg total) by mouth daily. Patient taking differently: Take 20 mg by mouth daily.  08/02/18  Yes Beard, Samantha N, DO  insulin NPH Human (HUMULIN N,NOVOLIN N) 100 UNIT/ML injection Inject into skin, sliding scale.  <150 0 U  150-199: 2U  200-249: 4 U 250-299: 6 U  300-349: 8 U  350-400: 10 U  > 400 call doc 08/02/18  Yes Leticia PennaBeard, Samantha N, DO  lisinopril (PRINIVIL,ZESTRIL) 40 MG tablet Take 1 tablet (40 mg total) by mouth daily. 08/02/18  Yes Leticia PennaBeard, Samantha N, DO  metoprolol tartrate  (LOPRESSOR) 25 MG tablet Take 0.5 tablets (12.5 mg total) by mouth 2 (two) times daily. 08/02/18  Yes Allayne StackBeard, Samantha N, DO  Semaglutide,0.25 or 0.5MG /DOS, (OZEMPIC, 0.25 OR 0.5 MG/DOSE,) 2 MG/1.5ML SOPN Inject 0.2 mLs into the skin every Saturday. 12/28/18  Yes [provider]  glucose blood test strip Use as instructed 08/02/18   Allayne StackBeard, Samantha N, DO  insulin NPH-regular Human (70-30) 100 UNIT/ML injection 28 units with breakfast, 15 units with dinner Patient not taking: Reported on 01/15/2019 08/02/18   Allayne StackBeard, Samantha N, DO  Insulin Syringe-Needle U-100 30G X 5/16" 0.3 ML MISC 2 each by Does not apply route 2 (two) times daily. 08/02/18   Allayne StackBeard, Samantha N, DO  omeprazole (PRILOSEC) 20 MG capsule Take 1 capsule (20 mg total) by mouth daily. Patient not taking: Reported on 01/15/2019 08/02/18   Allayne StackBeard, Samantha N, DO  tiotropium (SPIRIVA HANDIHALER) 18 MCG inhalation capsule Place 1 capsule (18 mcg total) into inhaler and inhale daily. Patient not taking: Reported on 01/15/2019 08/02/18 10/01/18  Allayne StackBeard, Samantha N, DO    Family History History reviewed. No pertinent family history.  Social History Social History   Tobacco Use   Smoking status: Never Smoker   Smokeless tobacco: Never Used  Substance Use Topics   Alcohol use: Never    Frequency: Never   Drug use: Never     Allergies   Penicillins   Review of Systems Review of Systems  Constitutional: Negative for chills and fever.  Respiratory: Positive for shortness of breath.   Cardiovascular: Positive for chest pain and palpitations.  Gastrointestinal: Positive for abdominal pain, diarrhea, nausea and vomiting.  Genitourinary: Negative for dysuria, frequency, hematuria and urgency.  Neurological: Negative for syncope, weakness, numbness and headaches.  All other systems reviewed and are negative.    Physical Exam Updated Vital Signs BP 116/62    Pulse 82    Temp 100 F (37.8 C) (Rectal)    Resp 16    Ht 5\' 4"  (1.626  m)    Wt 121.1 kg    SpO2 95%    BMI 45.83 kg/m   Physical Exam Vitals signs and nursing note reviewed.  Constitutional:      General: She is not in acute distress.    Appearance: She is well-developed. She is obese.  HENT:     Head: Normocephalic and atraumatic.  Eyes:     General:        Right eye: No discharge.        Left eye: No discharge.     Extraocular Movements: Extraocular movements intact.     Conjunctiva/sclera: Conjunctivae normal.     Pupils: Pupils are equal, round, and reactive to light.  Neck:     Musculoskeletal: Normal range of motion and neck supple.     Vascular: No JVD.     Trachea: No  tracheal deviation.  Cardiovascular:     Rate and Rhythm: Tachycardia present. Rhythm irregular.     Pulses:          Radial pulses are 2+ on the right side and 2+ on the left side.       Dorsalis pedis pulses are 2+ on the right side and 2+ on the left side.       Posterior tibial pulses are 2+ on the right side and 2+ on the left side.  Pulmonary:     Effort: Pulmonary effort is normal. Tachypnea present.     Comments: Examination limited due to body habitus, globally diminished breath sounds Chest:     Chest wall: Tenderness present.     Comments: Tenderness to palpation of the anterior chest wall, worse along the sternum with no deformity, crepitus, or ecchymosis. Abdominal:     General: Bowel sounds are normal. There is no distension.     Palpations: Abdomen is soft.     Tenderness: There is generalized abdominal tenderness and tenderness in the right upper quadrant and right lower quadrant. There is no guarding or rebound.  Musculoskeletal:     Right lower leg: No edema.     Left lower leg: No edema.  Skin:    General: Skin is warm and Burns.     Findings: No erythema.  Neurological:     General: No focal deficit present.     Mental Status: She is alert and oriented to person, place, and time.     Cranial Nerves: No cranial nerve deficit.     Motor: No weakness.       Comments: Fluent speech, no facial droop, moves extremities spontaneously without difficulty.  Psychiatric:        Behavior: Behavior normal.      ED Treatments / Results  Labs (all labs ordered are listed, but only abnormal results are displayed) Labs Reviewed  SARS CORONAVIRUS 2 ([facility]) - Abnormal; Notable for the following components:      Result Value   SARS Coronavirus 2 POSITIVE (*)    All other components within normal limits  CBC WITH DIFFERENTIAL/PLATELET - Abnormal; Notable for the following components:   RDW 16.7 (*)    Lymphs Abs 0.6 (*)    All other components within normal limits  COMPREHENSIVE METABOLIC PANEL - Abnormal; Notable for the following components:   Glucose, Bld 108 (*)    Calcium 8.1 (*)    Albumin 2.5 (*)    All other components within normal limits  CULTURE, BLOOD (ROUTINE X 2)  CULTURE, BLOOD (ROUTINE X 2)  LIPASE, BLOOD  LACTIC ACID, PLASMA  LACTIC ACID, PLASMA  D-DIMER, QUANTITATIVE (NOT AT [facility])  PROCALCITONIN  LACTATE DEHYDROGENASE  FERRITIN  TRIGLYCERIDES  FIBRINOGEN  C-REACTIVE PROTEIN  TROPONIN I (HIGH SENSITIVITY)  TROPONIN I (HIGH SENSITIVITY)    EKG EKG Interpretation  Date/Time:  Sunday January 15 2019 08:18:03 EDT Ventricular Rate:  88 PR Interval:    QRS Duration: 81 QT Interval:  352 QTC Calculation: 426 R Axis:   55 Text Interpretation:  Sinus rhythm Borderline T abnormalities, anterior leads No previous ECGs available Confirmed by Gareth Morgan [phone]) on 01/15/2019 12:41:21 PM   Radiology Ct Angio Chest Pe W And/or Wo Contrast  Result Date: 01/15/2019 CLINICAL DATA:  EMS call for CP and SHOB x 1 week . Pt reported Pain increased with movement. Pt has Dx of A-fib and a ablation the patient .  Pt given 324 ASA and one NGT SL . BP 120/70 prior to meds and BP 74/40 after meds. On arrival EMS reported BP 120/70. Pt reports relief of CP From meds. HR 80, CBG 137, 97.2  temp. EXAM: CT ANGIOGRAPHY CHEST CT ABDOMEN AND PELVIS WITH CONTRAST TECHNIQUE: Multidetector CT imaging of the chest was performed using the standard protocol during bolus administration of intravenous contrast. Multiplanar CT image reconstructions and MIPs were obtained to evaluate the vascular anatomy. Multidetector CT imaging of the abdomen and pelvis was performed using the standard protocol during bolus administration of intravenous contrast. CONTRAST:  100mL OMNIPAQUE IOHEXOL 350 MG/ML SOLN COMPARISON:  None. FINDINGS: CTA CHEST FINDINGS Cardiovascular: There is satisfactory opacification of the pulmonary arteries to the segmental level. There is no evidence of a pulmonary embolism. Heart is normal in size. No pericardial effusion. Minimal three-vessel coronary artery calcifications. Great vessels are normal in caliber. Mild aortic atherosclerosis. No dissection. Mediastinum/Nodes: Large hiatal hernia, lying to the right of the lower thoracic spine. Esophagus unremarkable. Trachea widely patent. Normal thyroid. No neck base, axillary, mediastinal or hilar masses or enlarged lymph nodes. Lungs/Pleura: Bilateral, peripheral ground-glass airspace opacities. Slight upper lobe predominance. No lung masses or suspicious nodules. No interstitial thickening. No pleural effusion or pneumothorax. Musculoskeletal: No fracture or acute finding. No osteoblastic or osteolytic lesions. Review of the MIP images confirms the above findings. CT ABDOMEN and PELVIS FINDINGS Hepatobiliary: Decreased attenuation of the liver consistent with fatty infiltration. No liver mass or focal lesion. Liver normal in size. Normal gallbladder. No bile duct dilation. Pancreas: Unremarkable. No pancreatic ductal dilatation or surrounding inflammatory changes. Spleen: Normal in size without focal abnormality. Adrenals/Urinary Tract: Adrenal glands are unremarkable. Kidneys are normal, without renal calculi, focal lesion, or hydronephrosis.  Bladder is unremarkable. Stomach/Bowel: Stomach unremarkable other than the large hiatal hernia. Small bowel and colon are normal in caliber. No wall thickening. No inflammation. There are colonic diverticula mostly along the left colon. No diverticulitis. Normal appendix visualized. Vascular/Lymphatic: Mild aortic atherosclerosis.  No aneurysm. No enlarged lymph nodes. Reproductive: Small partly calcified uterine fibroids. Uterus normal in overall size. No ovarian/adnexal masses. Other: No abdominal wall hernia or abnormality. No abdominopelvic ascites. Musculoskeletal: No fracture or acute finding. No osteoblastic or osteolytic lesions. Review of the MIP images confirms the above findings. IMPRESSION: CTA CHEST 1. No evidence of a pulmonary embolism. 2. Bilateral peripheral ground-glass airspace lung opacities with a slight upper lobe predominance. Findings consistent with multifocal infection. Consider atypical infection including viral infection. Noninfectious lung inflammation may also have this appearance. 3. No other acute findings in the chest. 4. Large hiatal hernia. CT ABDOMEN AND PELVIS 1. No acute findings within the abdomen or pelvis. 2. Hepatic steatosis. 3. Aortic atherosclerosis. 4. Scattered colonic diverticula without diverticulitis. Electronically Signed   By: Amie Portlandavid  Ormond M.D.   On: 01/15/2019 12:01   Ct Abdomen Pelvis W Contrast  Result Date: 01/15/2019 CLINICAL DATA:  EMS call for CP and SHOB x 1 week . Pt reported Pain increased with movement. Pt has Dx of A-fib and a ablation the patient . Pt given 324 ASA and one NGT SL . BP 120/70 prior to meds and BP 74/40 after meds. On arrival EMS reported BP 120/70. Pt reports relief of CP From meds. HR 80, CBG 137, 97.2 temp. EXAM: CT ANGIOGRAPHY CHEST CT ABDOMEN AND PELVIS WITH CONTRAST TECHNIQUE: Multidetector CT imaging of the chest was performed using the standard protocol during bolus administration of intravenous contrast. Multiplanar CT  image reconstructions and MIPs were obtained to evaluate the vascular anatomy. Multidetector CT imaging of the abdomen and pelvis was performed using the standard protocol during bolus administration of intravenous contrast. CONTRAST:  OMNIPAQUE IOHEXOL 350 MG/ML SOLN COMPARISON:  None. FINDINGS: CTA CHEST FINDINGS Cardiovascular: There is satisfactory opacification of the pulmonary arteries to the segmental level. There is no evidence of a pulmonary embolism. Heart is normal in size. No pericardial effusion. Minimal three-vessel coronary artery calcifications. Great vessels are normal in caliber. Mild aortic atherosclerosis. No dissection. Mediastinum/Nodes: Large hiatal hernia, lying to the right of the lower thoracic spine. Esophagus unremarkable. Trachea widely patent. Normal thyroid. No neck base, axillary, mediastinal or hilar masses or enlarged lymph nodes. Lungs/Pleura: Bilateral, peripheral ground-glass airspace opacities. Slight upper lobe predominance. No lung masses or suspicious nodules. No interstitial thickening. No pleural effusion or pneumothorax. Musculoskeletal: No fracture or acute finding. No osteoblastic or osteolytic lesions. Review of the MIP images confirms the above findings. CT ABDOMEN and PELVIS FINDINGS Hepatobiliary: Decreased attenuation of the liver consistent with fatty infiltration. No liver mass or focal lesion. Liver normal in size. Normal gallbladder. No bile duct dilation. Pancreas: Unremarkable. No pancreatic ductal dilatation or surrounding inflammatory changes. Spleen: Normal in size without focal abnormality. Adrenals/Urinary Tract: Adrenal glands are unremarkable. Kidneys are normal, without renal calculi, focal lesion, or hydronephrosis. Bladder is unremarkable. Stomach/Bowel: Stomach unremarkable other than the large hiatal hernia. Small bowel and colon are normal in caliber. No wall thickening. No inflammation. There are colonic diverticula mostly along the left  colon. No diverticulitis. Normal appendix visualized. Vascular/Lymphatic: Mild aortic atherosclerosis.  No aneurysm. No enlarged lymph nodes. Reproductive: Small partly calcified uterine fibroids. Uterus normal in overall size. No ovarian/adnexal masses. Other: No abdominal wall hernia or abnormality. No abdominopelvic ascites. Musculoskeletal: No fracture or acute finding. No osteoblastic or osteolytic lesions. Review of the MIP images confirms the above findings. IMPRESSION: CTA CHEST 1. No evidence of a pulmonary embolism. 2. Bilateral peripheral ground-glass airspace lung opacities with a slight upper lobe predominance. Findings consistent with multifocal infection. Consider atypical infection including viral infection. Noninfectious lung inflammation may also have this appearance. 3. No other acute findings in the chest. 4. Large hiatal hernia. CT ABDOMEN AND PELVIS 1. No acute findings within the abdomen or pelvis. 2. Hepatic steatosis. 3. Aortic atherosclerosis. 4. Scattered colonic diverticula without diverticulitis. Electronically Signed   By: Amie Portland M.D.   On: 01/15/2019 12:01   Dg Chest Port 1 View  Result Date: 01/15/2019 CLINICAL DATA:  Fall 7/25, chest pain/SOB since then. Hx of stroke, lupus, hypertension, diabetes, coronary artery disease, asthma, afib. Nonsmoker. EXAM: PORTABLE CHEST 1 VIEW COMPARISON:  None. FINDINGS: Cardiac silhouette is mildly enlarged. No mediastinal or hilar masses. Subtle hazy airspace opacities are noted in the peripheral left mid and lower lung, possibly in the peripheral right mid lung. There are mildly prominent bronchovascular markings diffusely. No convincing pleural effusion.  No pneumothorax. Skeletal structures are grossly intact. IMPRESSION: 1. Subtle hazy airspace opacities in the left mid and lower lung and possibly right peripheral mid lung. Consider multifocal pneumonia if there are consistent clinical findings. Electronically Signed   By: Amie Portland M.D.   On: 01/15/2019 10:26    Procedures .Critical Care Performed by: Jeanie Sewer, PA-C Authorized by: Jeanie Sewer, PA-C   Critical care provider statement:    Critical care time (minutes):  35   Critical care was necessary to treat or prevent imminent or life-threatening deterioration  of the following conditions:  Respiratory failure   Critical care was time spent personally by me on the following activities:  Discussions with consultants, evaluation of patient's response to treatment, examination of patient, ordering and performing treatments and interventions, ordering and review of laboratory studies, ordering and review of radiographic studies, pulse oximetry, re-evaluation of patient's condition, obtaining history from patient or surrogate and review of old charts   (including critical care time)  Medications Ordered in ED Medications  morphine 2 MG/ML injection 2 mg (has no administration in time range)  LORazepam (ATIVAN) injection 0.5 mg (has no administration in time range)  sodium chloride 0.9 % bolus 1,000 mL (1,000 mLs Intravenous New Bag/Given 01/15/19 1018)  acetaminophen (TYLENOL) tablet 650 mg (650 mg Oral Given 01/15/19 1211)  iohexol (OMNIPAQUE) 350 MG/ML injection 100 mL (100 mLs Intravenous Contrast Given 01/15/19 1139)     Initial Impression / Assessment and Plan / ED Course  I have reviewed the triage vital signs and the nursing notes.  Pertinent labs & imaging results that were available during my care of the patient were reviewed by me and considered in my medical decision making (see chart for details).        Stacey Gae DryReese Boone was evaluated in Emergency Department on 01/15/2019 for the symptoms described in the history of present illness. She was evaluated in the context of the global COVID-19 pandemic, which necessitated consideration that the patient might be at risk for infection with the SARS-CoV-2 virus that causes COVID-19. Institutional  protocols and algorithms that pertain to the evaluation of patients at risk for COVID-19 are in a state of rapid change based on information released by regulatory bodies including the CDC and federal and state organizations. These policies and algorithms were followed during the patient's care in the ED.  Patient with progressively worsening shortness of breath, chest pain, abdominal pain nausea vomiting and diarrhea for 1 week.  Symptoms began shortly after a fall.  Low-grade temp of 100 F, persistently tachycardic and tachypneic in the ED.  She had significant improvement with application of supplemental oxygen 2 L nasal cannula.  EKG shows normal sinus rhythm with T wave abnormalities in the anterior leads but serial troponins are negative and I have a low suspicion of ACS/MI.  Chest x-ray concerning for multifocal pneumonia.  Given her persistent tachycardia and increased work of breathing in the setting of trauma a CTA of the chest was obtained as well as CT of the abdomen and pelvis which showed no evidence of PE but does show findings concerning for multifocal infection in the lungs with no acute surgical abdominal pathology.  She did test positive for COVID-19.  Patient resting more comfortably on reassessment, reports she is feeling better after the administration of supplemental oxygen.  Given the patient's multiple comorbidities, obesity, and increased work of breathing requiring supplemental oxygen, I feel she would benefit from admission to the hospital for further evaluation and management.  Spoke with Dr. Willette PaSheehan with [facility] hospitalist service who agrees to assume care of patient and bring her into the hospital for further evaluation and management.  Final Clinical Impressions(s) / ED Diagnoses   Final diagnoses:  COVID-19 virus infection    ED Discharge Orders    None       Jeanie SewerFawze, Mayuri Staples A, PA-C 01/15/19 1427    Alvira MondaySchlossman, Erin, MD 01/16/19 1109

## 2019-01-15 NOTE — Progress Notes (Signed)
Patient not placed on CPAP at this time due to testing positive for COVID-19.

## 2019-01-15 NOTE — ED Notes (Signed)
Pt's son has been at bedside until 1510. Pt is resting and will be given food by NT.

## 2019-01-15 NOTE — ED Notes (Signed)
Wasted 0.75 mL

## 2019-01-15 NOTE — ED Notes (Signed)
Phlebotomy to draw second set blood cultures and other labs.

## 2019-01-15 NOTE — ED Notes (Signed)
Pt transported to [place]

## 2019-01-15 NOTE — ED Triage Notes (Signed)
EMS call for CP and SHOB x 1 week . Marland Kitchen Pt reported Pain increased with movement. Pt has Dx of A-fib and a ablation the patient . Pt given 324 ASA and one NGT SL . BP 120/70 prior to meds and BP 74/40 after meds. On arrival EMS reported BP 120/70. Pt reports relief of CP  From meds.. HR 80, CBG 137, 97.2 temp.

## 2019-01-15 NOTE — ED Notes (Signed)
Spoke with pt brother, Dr Loma Sousa to update.

## 2019-01-15 NOTE — H&P (Addendum)
History and Physical    Stacey Burns FAO:[id number] DOB: 13-Jan-1956 DOA: 01/15/2019  PCP: Lenord Fellershurch, Shea N, PA-C  Patient coming from: Home  I have personally briefly reviewed [facility]  Chief Complaint: Worsening shortness of breath with chest pain and abdominal pain for 1 week  HPI: Stacey Burns is a 63 y.o. female with medical history significant of diabetes type 2, hypertension, lupus, coronary artery disease, atrial fibrillation and a coagulation disorder on lifetime Eliquis presents the emergency department with worsening shortness of breath associated with chest pain and abdominal pain for 1 week.  Had a fall a week ago and since then has been feeling worse and worse.  She had no prodrome leading up to her fall but since then has had the shortness of breath chest pain and some lightheadedness.  She landed on her right side but no head injury no loss of consciousness.  Is a hard time describing her pain but reports that it substernal occurs with any sort of exertion or even ambulation.  It somewhat worse with breathing.  She has had a few episodes of nonbilious emesis with mild blood streaking the past week.  Has had generalized abdominal pain since that fall on the right side which is where she was struck when she fell.  She has had intermittent palpitations.  And had an episode of chest pain prior to arrival and took sublingual for it.  Nitroglycerin resolved her pain from 8 out of 10 to 0 out of 10.  He is anticoagulated on Eliquis and is compliant with her home medications.  Lives with her daughter and son-in-law.  She has been isolating however she reports that her son-in-law has had a cough for the past 3 weeks.  Evaluation in the emergency department revealed that she was positive for COVID-19.  ED Course: Low-grade temperature persistent tachycardia and tachypnea with improvement of 2 L of supplemental oxygen.  Serial troponins are  negative.  Low suspicion for cardiac etiology of discomfort.  Chest x-ray shows multifocal pneumonia CTA of the chest and abdomen showed no evidence of pulmonary embolism but multifocal infection in the lungs, no acute infection or etiology for pain in the abdomen except for left colon diverticulosis without evidence of diverticulitis.  Oxygen supplementation resulted in patient feeling better.  Due to increased work of breathing requiring supplemental oxygen she was referred to medicine for further evaluation.  Initially the patient's heart rate was quite elevated on presentation but settled down with administration of oxygen.  Review of Systems: As per HPI otherwise all other systems reviewed and  negative.   Past Medical History:  Diagnosis Date   Asthma    Coronary artery disease    CPAP (continuous positive airway pressure) dependence    Diabetes mellitus without complication (HCC)    Hypertension    Lupus (HCC)    Paroxysmal atrial fibrillation (HCC)    Stroke [facility](HCC)   Thrombotic stroke  Past Surgical History:  Procedure Laterality Date   ATRIAL FIBRILLATION ABLATION      Social History   Social History Narrative   Lives with multiple family members who are not isolating     reports that she has never smoked. She has never used smokeless tobacco. She reports that she does not drink alcohol or use drugs.  Allergies  Allergen Reactions   Penicillins Hives and Swelling    Did it involve swelling of the face/tongue/throat, SOB, or low BP? Yes Did  it involve sudden or severe rash/hives, skin peeling, or any reaction on the inside of your mouth or nose? No Did you need to seek medical attention at a hospital or doctor's office? No When did it last happen?<less than 10 years If all above answers are NO, may proceed with cephalosporin use.     Family History  Problem Relation Age of Onset   Stroke Mother        blood clot in brain   Breast cancer Sister      Breast cancer Sister    Breast cancer Sister      Prior to Admission medications   Medication Sig Start Date End Date Taking? Authorizing Provider  acetaminophen (TYLENOL) 325 MG tablet Take 325 mg by mouth every 6 (six) hours as needed for pain. 01/11/19  Yes [provider]  albuterol (VENTOLIN HFA) 108 (90 Base) MCG/ACT inhaler Inhale 2 puffs into the lungs every 6 (six) hours as needed for wheezing or shortness of breath.   Yes [provider]  apixaban (ELIQUIS) 5 MG TABS tablet Take 1 tablet (5 mg total) by mouth 2 (two) times daily. 08/29/18  Yes Patriciaann Clan, DO  azaTHIOprine (IMURAN) 50 MG tablet Take 175 mg by mouth every morning. 01/11/19  Yes [provider]  baclofen (LIORESAL) 10 MG tablet Take 1 tablet (10 mg total) by mouth at bedtime. 08/02/18  Yes Darrelyn Hillock N, DO  furosemide (LASIX) 40 MG tablet Take 1 tablet (40 mg total) by mouth daily. Patient taking differently: Take 20 mg by mouth daily.  08/02/18  Yes Beard, Samantha N, DO  insulin NPH Human (HUMULIN N,NOVOLIN N) 100 UNIT/ML injection Inject into skin, sliding scale.  <150 0 U  150-199: 2U  200-249: 4 U 250-299: 6 U  300-349: 8 U  350-400: 10 U  > 400 call doc 08/02/18  Yes Darrelyn Hillock N, DO  lisinopril (PRINIVIL,ZESTRIL) 40 MG tablet Take 1 tablet (40 mg total) by mouth daily. 08/02/18  Yes Darrelyn Hillock N, DO  metoprolol tartrate (LOPRESSOR) 25 MG tablet Take 0.5 tablets (12.5 mg total) by mouth 2 (two) times daily. 08/02/18  Yes Patriciaann Clan, DO  Semaglutide,0.25 or 0.5MG /DOS, (OZEMPIC, 0.25 OR 0.5 MG/DOSE,) 2 MG/1.5ML SOPN Inject 0.2 mLs into the skin every Saturday. 12/28/18  Yes [provider]  glucose blood test strip Use as instructed 08/02/18   Patriciaann Clan, DO  insulin NPH-regular Human (70-30) 100 UNIT/ML injection 28 units with breakfast, 15 units with dinner Patient not taking: Reported on 01/15/2019 08/02/18   Patriciaann Clan, DO  Insulin  Syringe-Needle U-100 30G X 5/16" 0.3 ML MISC 2 each by Does not apply route 2 (two) times daily. 08/02/18   Patriciaann Clan, DO  omeprazole (PRILOSEC) 20 MG capsule Take 1 capsule (20 mg total) by mouth daily. Patient not taking: Reported on 01/15/2019 08/02/18   Patriciaann Clan, DO  tiotropium (SPIRIVA HANDIHALER) 18 MCG inhalation capsule Place 1 capsule (18 mcg total) into inhaler and inhale daily. Patient not taking: Reported on 01/15/2019 08/02/18 10/01/18  Patriciaann Clan, DO    Physical Exam:  Constitutional: Looks acutely ill moderate use of accessory muscles very anxious Vitals:   01/15/19 1600 01/15/19 1745 01/15/19 1800 01/15/19 1808  BP: (!) 104/56 (!) 144/73 136/89   Pulse: 82 88 88 93  Resp: 20 (!) 37 (!) 26 (!) 27  Temp:      TempSrc:      SpO2: 95% 94%  96%  Weight:      Height:       Eyes: PERRL, lids and conjunctivae normal ENMT: Mucous membranes are moist. Posterior pharynx clear of any exudate or lesions.Normal dentition.  Neck: normal, supple, no masses, no thyromegaly Respiratory: Coarse breath sounds bilaterally, no wheezing, no crackles. Normal respiratory effort. No accessory muscle use.  Cardiovascular: Regular rate and rhythm, no murmurs / rubs / gallops. No extremity edema. 2+ pedal pulses. No carotid bruits.  Abdomen: no tenderness, no masses palpated. No hepatosplenomegaly. Bowel sounds positive.  Musculoskeletal: no clubbing / cyanosis. No joint deformity upper and lower extremities. Good ROM, no contractures. Normal muscle tone.  Skin: Hot and moist no rashes, lesions, ulcers. No induration Neurologic: CN 2-12 grossly intact. Sensation intact, DTR normal. Strength 5/5 in all 4.  Psychiatric: Normal judgment and insight. Alert and oriented x 3. Normal mood.    Labs on Admission: I have personally reviewed following labs and imaging studies  CBC: Recent Labs  Lab 01/15/19 0910  WBC 5.7  NEUTROABS 4.5  HGB 14.1  HCT 43.9  MCV 90.9  PLT 199    Basic Metabolic Panel: Recent Labs  Lab 01/15/19 0910  NA 137  K 3.7  CL 104  CO2 24  GLUCOSE 108*  BUN 10  CREATININE 0.89  CALCIUM 8.1*   GFR: Estimated Creatinine Clearance: 83 mL/min (by C-G formula based on SCr of 0.89 mg/dL). Liver Function Tests: Recent Labs  Lab 01/15/19 0910  AST 40  ALT 21  ALKPHOS 47  BILITOT 0.9  PROT 7.1  ALBUMIN 2.5*   Recent Labs  Lab 01/15/19 0910  LIPASE 43   Lipid Profile: Recent Labs    01/15/19 1444  TRIG 91    Radiological Exams on Admission: Ct Angio Chest Pe W And/or Wo Contrast  Result Date: 01/15/2019 CLINICAL DATA:  EMS call for CP and SHOB x 1 week . Pt reported Pain increased with movement. Pt has Dx of A-fib and a ablation the patient . Pt given 324 ASA and one NGT SL . BP 120/70 prior to meds and BP 74/40 after meds. On arrival EMS reported BP 120/70. Pt reports relief of CP From meds. HR 80, CBG 137, 97.2 temp. EXAM: CT ANGIOGRAPHY CHEST CT ABDOMEN AND PELVIS WITH CONTRAST TECHNIQUE: Multidetector CT imaging of the chest was performed using the standard protocol during bolus administration of intravenous contrast. Multiplanar CT image reconstructions and MIPs were obtained to evaluate the vascular anatomy. Multidetector CT imaging of the abdomen and pelvis was performed using the standard protocol during bolus administration of intravenous contrast. CONTRAST:  100mL OMNIPAQUE IOHEXOL 350 MG/ML SOLN COMPARISON:  None. FINDINGS: CTA CHEST FINDINGS Cardiovascular: There is satisfactory opacification of the pulmonary arteries to the segmental level. There is no evidence of a pulmonary embolism. Heart is normal in size. No pericardial effusion. Minimal three-vessel coronary artery calcifications. Great vessels are normal in caliber. Mild aortic atherosclerosis. No dissection. Mediastinum/Nodes: Large hiatal hernia, lying to the right of the lower thoracic spine. Esophagus unremarkable. Trachea widely patent. Normal thyroid. No  neck base, axillary, mediastinal or hilar masses or enlarged lymph nodes. Lungs/Pleura: Bilateral, peripheral ground-glass airspace opacities. Slight upper lobe predominance. No lung masses or suspicious nodules. No interstitial thickening. No pleural effusion or pneumothorax. Musculoskeletal: No fracture or acute finding. No osteoblastic or osteolytic lesions. Review of the MIP images confirms the above findings. CT ABDOMEN and PELVIS FINDINGS Hepatobiliary: Decreased attenuation of the liver consistent with fatty infiltration. No liver mass  or focal lesion. Liver normal in size. Normal gallbladder. No bile duct dilation. Pancreas: Unremarkable. No pancreatic ductal dilatation or surrounding inflammatory changes. Spleen: Normal in size without focal abnormality. Adrenals/Urinary Tract: Adrenal glands are unremarkable. Kidneys are normal, without renal calculi, focal lesion, or hydronephrosis. Bladder is unremarkable. Stomach/Bowel: Stomach unremarkable other than the large hiatal hernia. Small bowel and colon are normal in caliber. No wall thickening. No inflammation. There are colonic diverticula mostly along the left colon. No diverticulitis. Normal appendix visualized. Vascular/Lymphatic: Mild aortic atherosclerosis.  No aneurysm. No enlarged lymph nodes. Reproductive: Small partly calcified uterine fibroids. Uterus normal in overall size. No ovarian/adnexal masses. Other: No abdominal wall hernia or abnormality. No abdominopelvic ascites. Musculoskeletal: No fracture or acute finding. No osteoblastic or osteolytic lesions. Review of the MIP images confirms the above findings. IMPRESSION: CTA CHEST 1. No evidence of a pulmonary embolism. 2. Bilateral peripheral ground-glass airspace lung opacities with a slight upper lobe predominance. Findings consistent with multifocal infection. Consider atypical infection including viral infection. Noninfectious lung inflammation may also have this appearance. 3. No other  acute findings in the chest. 4. Large hiatal hernia. CT ABDOMEN AND PELVIS 1. No acute findings within the abdomen or pelvis. 2. Hepatic steatosis. 3. Aortic atherosclerosis. 4. Scattered colonic diverticula without diverticulitis. Electronically Signed   By: Amie Portlandavid  Ormond M.D.   On: 01/15/2019 12:01   Ct Abdomen Pelvis W Contrast  Result Date: 01/15/2019 CLINICAL DATA:  EMS call for CP and SHOB x 1 week . Pt reported Pain increased with movement. Pt has Dx of A-fib and a ablation the patient . Pt given 324 ASA and one NGT SL . BP 120/70 prior to meds and BP 74/40 after meds. On arrival EMS reported BP 120/70. Pt reports relief of CP From meds. HR 80, CBG 137, 97.2 temp. EXAM: CT ANGIOGRAPHY CHEST CT ABDOMEN AND PELVIS WITH CONTRAST TECHNIQUE: Multidetector CT imaging of the chest was performed using the standard protocol during bolus administration of intravenous contrast. Multiplanar CT image reconstructions and MIPs were obtained to evaluate the vascular anatomy. Multidetector CT imaging of the abdomen and pelvis was performed using the standard protocol during bolus administration of intravenous contrast. CONTRAST:  100mL OMNIPAQUE IOHEXOL 350 MG/ML SOLN COMPARISON:  None. FINDINGS: CTA CHEST FINDINGS Cardiovascular: There is satisfactory opacification of the pulmonary arteries to the segmental level. There is no evidence of a pulmonary embolism. Heart is normal in size. No pericardial effusion. Minimal three-vessel coronary artery calcifications. Great vessels are normal in caliber. Mild aortic atherosclerosis. No dissection. Mediastinum/Nodes: Large hiatal hernia, lying to the right of the lower thoracic spine. Esophagus unremarkable. Trachea widely patent. Normal thyroid. No neck base, axillary, mediastinal or hilar masses or enlarged lymph nodes. Lungs/Pleura: Bilateral, peripheral ground-glass airspace opacities. Slight upper lobe predominance. No lung masses or suspicious nodules. No interstitial  thickening. No pleural effusion or pneumothorax. Musculoskeletal: No fracture or acute finding. No osteoblastic or osteolytic lesions. Review of the MIP images confirms the above findings. CT ABDOMEN and PELVIS FINDINGS Hepatobiliary: Decreased attenuation of the liver consistent with fatty infiltration. No liver mass or focal lesion. Liver normal in size. Normal gallbladder. No bile duct dilation. Pancreas: Unremarkable. No pancreatic ductal dilatation or surrounding inflammatory changes. Spleen: Normal in size without focal abnormality. Adrenals/Urinary Tract: Adrenal glands are unremarkable. Kidneys are normal, without renal calculi, focal lesion, or hydronephrosis. Bladder is unremarkable. Stomach/Bowel: Stomach unremarkable other than the large hiatal hernia. Small bowel and colon are normal in caliber. No wall thickening. No  inflammation. There are colonic diverticula mostly along the left colon. No diverticulitis. Normal appendix visualized. Vascular/Lymphatic: Mild aortic atherosclerosis.  No aneurysm. No enlarged lymph nodes. Reproductive: Small partly calcified uterine fibroids. Uterus normal in overall size. No ovarian/adnexal masses. Other: No abdominal wall hernia or abnormality. No abdominopelvic ascites. Musculoskeletal: No fracture or acute finding. No osteoblastic or osteolytic lesions. Review of the MIP images confirms the above findings. IMPRESSION: CTA CHEST 1. No evidence of a pulmonary embolism. 2. Bilateral peripheral ground-glass airspace lung opacities with a slight upper lobe predominance. Findings consistent with multifocal infection. Consider atypical infection including viral infection. Noninfectious lung inflammation may also have this appearance. 3. No other acute findings in the chest. 4. Large hiatal hernia. CT ABDOMEN AND PELVIS 1. No acute findings within the abdomen or pelvis. 2. Hepatic steatosis. 3. Aortic atherosclerosis. 4. Scattered colonic diverticula without diverticulitis.  Electronically Signed   By: Amie Portland M.D.   On: 01/15/2019 12:01   Dg Chest Port 1 View  Result Date: 01/15/2019 CLINICAL DATA:  Fall 7/25, chest pain/SOB since then. Hx of stroke, lupus, hypertension, diabetes, coronary artery disease, asthma, afib. Nonsmoker. EXAM: PORTABLE CHEST 1 VIEW COMPARISON:  None. FINDINGS: Cardiac silhouette is mildly enlarged. No mediastinal or hilar masses. Subtle hazy airspace opacities are noted in the peripheral left mid and lower lung, possibly in the peripheral right mid lung. There are mildly prominent bronchovascular markings diffusely. No convincing pleural effusion.  No pneumothorax. Skeletal structures are grossly intact. IMPRESSION: 1. Subtle hazy airspace opacities in the left mid and lower lung and possibly right peripheral mid lung. Consider multifocal pneumonia if there are consistent clinical findings. Electronically Signed   By: Amie Portland M.D.   On: 01/15/2019 10:26    EKG: Independently reviewed.  Atrial fibrillation at 111 bpm when I evaluated the patient she had returned into sinus rhythm at 78 bpm.  Assessment/Plan Principal Problem:   Pneumonia due to COVID-19 virus Active Problems:   Paroxysmal atrial fibrillation (HCC)   Type 2 diabetes mellitus (HCC)   Essential hypertension   Lupus (HCC)   CPAP (continuous positive airway pressure) dependence   Clotting disorder (HCC)   Hyperlipidemia    1.  Pneumonia due to COVID-19 virus infection in patient with a relatively low oxygen requirement.  Will place her in the hospital under observation. -Patient with presenting with SOB and no reported fever at home; also without cough productive of clear sputum -Mild anorexia and right sided abd pain noted without the presence of other GI symptoms -Possible contact for COVID with her son in law- not a known case but a possible/probable contact -She does not have a usual home O2 requirement and is currently requiring 2L [place] O2 -COVID  POSITIVE -The patient has comorbidities which may increase the risk for ARDS/MODS including: age, HTN, DM, obesity, lupus, CAD and a coagulation D/O -CXR with multifocal opacities which may be c/w COVID vs. Multifocal PNA; CT chest was performed/showed ground glass opacities - will start remdesivir -Will admit to [facility] for further evaluation, close monitoring, and treatment -At this time, will attempt to avoid use of aerosolized medications and use HFAs instead -Will check daily labs including BMP with Mag, Phos; LFTs; CBC with differential; CRP (q12h); ferritin; fibrinogen; D-dimer (q12h) -Will order Remdesivir (pharmacy consult) given +COVID test, +CXR, and hypoxia <94% on room air -If the patient shows clinical deterioration, consider transfer to ICU with PCCM consultation -If the patient is hypoxic or on >3L oxygen from baseline or  CRP >7, considerTocilizumab 8 mg/kg x1 IF + COVID test; O2 sats <88% on room air; and patient is at high risk for intubation.  Will defer to [facility] doctors regarding this medication since it is being used off label. -Will attempt to maintain euvolemia to a net negative fluid status -Will ask the patient to maintain an awake prone position for 16+ hours a day, if possible, with a minimum of 2-3 hours at a time -Patient was seen wearing full PPE including: gown, gloves, head cover, N95, and face shield; donning and doffing was in compliance with current standards.  2.  Paroxysmal atrial fibrillation: Continue rate control medications and Eliquis.  3.  Diabetes type 2: Continue home medications.  Hold metformin.  Sliding scale insulin coverage.  3.  Essential hypertension: Given COVID-19 will hold blood pressure medications.  4.  Lupus: Noted.  5.  Sleep apnea patient uses CPAP at home: We will ask patient to give [place] her pressure settings and will use CPAP while in the hospital.  6.  Clotting disorder: On lifetime Eliquis  7.  Reported hyperlipidemia: Patient does  not take any statin at home.  We will have her follow-up with primary as this may not be necessary to start in the hospital.  DVT prophylaxis: Eliquis Code Status: Full code Family Communication: spoke at length with the patient's brother Murlean Iba who reported himself as a physician, (Google search based on concerns regarding some of his statements revealed that he is a respiratory therapy supervisor at [employer]) as well as patient's son who was present at the time of admission.   Disposition Plan: Hopefully home in a.m. if patient is feeling better.  She will need home oxygen arranged Consults called: None Admission status: It is my clinical opinion that referral for OBSERVATION is reasonable and necessary in this patient based on the above information provided. The aforementioned taken together are felt to place the patient at high risk for further clinical deterioration. However it is anticipated that the patient may be medically stable for discharge from the hospital within 24 to 48 hours.    Lahoma Crocker MD FACP [facility] Pager [phone]  How to contact the [facility] Attending or Consulting provider 7A - 7P or covering provider during after hours 7P -7A, for this patient?  1. Check the care team in [facility] and look for a) attending/consulting [facility] provider listed and b) the [facility] team listed 2. Log into [email or web address] and use [facility]'s universal password to access. If you do not have the password, please contact the hospital operator. 3. Locate the [facility] provider you are looking for under [facility] and page to a number that you can be directly reached. 4. If you still have difficulty reaching the provider, please page the [facility] (Director on Call) for the Hospitalists listed on amion for assistance.  If 7PM-7AM, please contact night-coverage [email or web address] Password [email or web address]  01/15/2019, 7:08 PM

## 2019-01-15 NOTE — ED Notes (Signed)
Patient transported to CT 

## 2019-01-15 NOTE — ED Notes (Signed)
Wasted 0.75 mL of Ativan with Garlon Hatchet, RN

## 2019-01-15 NOTE — ED Notes (Signed)
Placed on 2 lpm for comfort

## 2019-01-15 NOTE — ED Triage Notes (Signed)
PT reports fall on Sat July  25.and that is when CP and SHOB started.  Pt also reports diarrhea every day since fall. Pt reports loss of appetite and only eats fruit.

## 2019-01-16 DIAGNOSIS — I480 Paroxysmal atrial fibrillation: Secondary | ICD-10-CM | POA: Diagnosis present

## 2019-01-16 DIAGNOSIS — J1289 Other viral pneumonia: Secondary | ICD-10-CM | POA: Diagnosis present

## 2019-01-16 DIAGNOSIS — Z9989 Dependence on other enabling machines and devices: Secondary | ICD-10-CM | POA: Diagnosis not present

## 2019-01-16 DIAGNOSIS — Z7901 Long term (current) use of anticoagulants: Secondary | ICD-10-CM | POA: Diagnosis not present

## 2019-01-16 DIAGNOSIS — E119 Type 2 diabetes mellitus without complications: Secondary | ICD-10-CM | POA: Diagnosis present

## 2019-01-16 DIAGNOSIS — I4892 Unspecified atrial flutter: Secondary | ICD-10-CM | POA: Diagnosis present

## 2019-01-16 DIAGNOSIS — J9601 Acute respiratory failure with hypoxia: Secondary | ICD-10-CM | POA: Diagnosis present

## 2019-01-16 DIAGNOSIS — I10 Essential (primary) hypertension: Secondary | ICD-10-CM

## 2019-01-16 DIAGNOSIS — G4733 Obstructive sleep apnea (adult) (pediatric): Secondary | ICD-10-CM | POA: Diagnosis present

## 2019-01-16 DIAGNOSIS — K5730 Diverticulosis of large intestine without perforation or abscess without bleeding: Secondary | ICD-10-CM | POA: Diagnosis present

## 2019-01-16 DIAGNOSIS — D689 Coagulation defect, unspecified: Secondary | ICD-10-CM | POA: Diagnosis present

## 2019-01-16 DIAGNOSIS — E785 Hyperlipidemia, unspecified: Secondary | ICD-10-CM | POA: Diagnosis present

## 2019-01-16 DIAGNOSIS — Z880 Allergy status to penicillin: Secondary | ICD-10-CM | POA: Diagnosis not present

## 2019-01-16 DIAGNOSIS — I2510 Atherosclerotic heart disease of native coronary artery without angina pectoris: Secondary | ICD-10-CM | POA: Diagnosis present

## 2019-01-16 DIAGNOSIS — I5032 Chronic diastolic (congestive) heart failure: Secondary | ICD-10-CM | POA: Diagnosis present

## 2019-01-16 DIAGNOSIS — Z6841 Body Mass Index (BMI) 40.0 and over, adult: Secondary | ICD-10-CM | POA: Diagnosis not present

## 2019-01-16 DIAGNOSIS — I110 Hypertensive heart disease with heart failure: Secondary | ICD-10-CM | POA: Diagnosis present

## 2019-01-16 DIAGNOSIS — J440 Chronic obstructive pulmonary disease with acute lower respiratory infection: Secondary | ICD-10-CM | POA: Diagnosis present

## 2019-01-16 DIAGNOSIS — Z794 Long term (current) use of insulin: Secondary | ICD-10-CM | POA: Diagnosis not present

## 2019-01-16 DIAGNOSIS — Z79899 Other long term (current) drug therapy: Secondary | ICD-10-CM | POA: Diagnosis not present

## 2019-01-16 DIAGNOSIS — M329 Systemic lupus erythematosus, unspecified: Secondary | ICD-10-CM | POA: Diagnosis not present

## 2019-01-16 DIAGNOSIS — I4891 Unspecified atrial fibrillation: Secondary | ICD-10-CM | POA: Diagnosis not present

## 2019-01-16 DIAGNOSIS — U071 COVID-19: Secondary | ICD-10-CM | POA: Diagnosis present

## 2019-01-16 DIAGNOSIS — Z823 Family history of stroke: Secondary | ICD-10-CM | POA: Diagnosis not present

## 2019-01-16 DIAGNOSIS — Z803 Family history of malignant neoplasm of breast: Secondary | ICD-10-CM | POA: Diagnosis not present

## 2019-01-16 DIAGNOSIS — E669 Obesity, unspecified: Secondary | ICD-10-CM | POA: Diagnosis present

## 2019-01-16 DIAGNOSIS — Z8673 Personal history of transient ischemic attack (TIA), and cerebral infarction without residual deficits: Secondary | ICD-10-CM | POA: Diagnosis not present

## 2019-01-16 DIAGNOSIS — W19XXXA Unspecified fall, initial encounter: Secondary | ICD-10-CM | POA: Diagnosis present

## 2019-01-16 LAB — BLOOD CULTURE ID PANEL (REFLEXED)

## 2019-01-16 LAB — GLUCOSE, CAPILLARY
Glucose-Capillary: 115 mg/dL — ABNORMAL HIGH (ref 70–99)
Glucose-Capillary: 158 mg/dL — ABNORMAL HIGH (ref 70–99)
Glucose-Capillary: 125 mg/dL — ABNORMAL HIGH (ref 70–99)

## 2019-01-16 LAB — HIV ANTIBODY (ROUTINE TESTING W REFLEX): HIV Screen 4th Generation wRfx: NONREACTIVE

## 2019-01-16 MED ORDER — SODIUM CHLORIDE 0.9 % IV SOLN
200.0000 mg | Freq: Once | INTRAVENOUS | Status: AC
  Administered 2019-01-16: 200 mg via INTRAVENOUS
  Filled 2019-01-16: qty 40

## 2019-01-16 MED ORDER — SODIUM CHLORIDE 0.9 % IV SOLN
100.0000 mg | INTRAVENOUS | Status: AC
  Administered 2019-01-17 – 2019-01-20 (×4): 100 mg via INTRAVENOUS
  Filled 2019-01-16 (×4): qty 20

## 2019-01-16 MED ORDER — DEXAMETHASONE SODIUM PHOSPHATE 10 MG/ML IJ SOLN
6.0000 mg | Freq: Every day | INTRAMUSCULAR | Status: DC
  Administered 2019-01-16 – 2019-01-18 (×3): 6 mg via INTRAVENOUS
  Filled 2019-01-16 (×3): qty 1

## 2019-01-16 MED ORDER — SODIUM CHLORIDE 0.9 % IV BOLUS: 500.0000 mL | Freq: Once | INTRAVENOUS | Status: DC

## 2019-01-16 NOTE — Progress Notes (Signed)
PHARMACY - PHYSICIAN COMMUNICATION CRITICAL VALUE ALERT - BLOOD CULTURE IDENTIFICATION (BCID)  Stacey Burns is an 63 y.o. female who presented to [facility] on 01/15/2019 with a chief complaint of COVID 19  Assessment:  Pt growing CONS (mecA negative) in 1/4 blood culture bottles. Likely contaminant  Name of physician (or Provider) Contacted: Dr. Loleta Books  Current antibiotics: None  Changes to prescribed antibiotics recommended:  No changes  Results for orders placed or performed during the hospital encounter of 01/15/19  Blood Culture ID Panel (Reflexed) (Collected: 01/15/2019  3:00 PM)  Result Value Ref Range   Enterococcus species NOT DETECTED NOT DETECTED   Listeria monocytogenes NOT DETECTED NOT DETECTED   Staphylococcus species DETECTED (A) NOT DETECTED   Staphylococcus aureus (BCID) NOT DETECTED NOT DETECTED   Methicillin resistance NOT DETECTED NOT DETECTED   Streptococcus species NOT DETECTED NOT DETECTED   Streptococcus agalactiae NOT DETECTED NOT DETECTED   Streptococcus pneumoniae NOT DETECTED NOT DETECTED   Streptococcus pyogenes NOT DETECTED NOT DETECTED   Acinetobacter baumannii NOT DETECTED NOT DETECTED   Enterobacteriaceae species NOT DETECTED NOT DETECTED   Enterobacter cloacae complex NOT DETECTED NOT DETECTED   Escherichia coli NOT DETECTED NOT DETECTED   Klebsiella oxytoca NOT DETECTED NOT DETECTED   Klebsiella pneumoniae NOT DETECTED NOT DETECTED   Proteus species NOT DETECTED NOT DETECTED   Serratia marcescens NOT DETECTED NOT DETECTED   Haemophilus influenzae NOT DETECTED NOT DETECTED   Neisseria meningitidis NOT DETECTED NOT DETECTED   Pseudomonas aeruginosa NOT DETECTED NOT DETECTED   Candida albicans NOT DETECTED NOT DETECTED   Candida glabrata NOT DETECTED NOT DETECTED   Candida krusei NOT DETECTED NOT DETECTED   Candida parapsilosis NOT DETECTED NOT DETECTED   Candida tropicalis NOT DETECTED NOT DETECTED    Stacey Burns, PharmD, BCPS CGV  Clinical pharmacist phone [phone] 01/16/2019  3:46 PM

## 2019-01-16 NOTE — Progress Notes (Signed)
Pharmacy Antibiotic Note  Stacey Burns is a 63 y.o. female admitted on 01/15/2019 with COVID 19.  Pharmacy has been consulted for Remdesivir dosing.  CXR shows Subtle hazy airspace opacities in the left mid and lower lung and possibly right peripheral mid lung. Consider multifocal pneumonia if there are consistent clinical findings. Pt requiring supplemental oxygen (3.5L [place]) ALT 21  Plan: Remdesivir 200mg  IV now then 100mg  IV daily x 4 days Will f/u ALT and pt's clinical condition   Height: 5\' 4"  (162.6 cm) Weight: 267 lb (121.1 kg) IBW/kg (Calculated) : 54.7  Temp (24hrs), Avg:99.2 F (37.3 C), Min:98.7 F (37.1 C), Max:99.8 F (37.7 C)  Recent Labs  Lab 01/15/19 0910 01/15/19 1444 01/15/19 2005  WBC 5.7  --   --   CREATININE 0.89  --   --   LATICACIDVEN  --  1.5 3.9*    Estimated Creatinine Clearance: 83 mL/min (by C-G formula based on SCr of 0.89 mg/dL).    Allergies  Allergen Reactions  . Penicillins Hives and Swelling    Did it involve swelling of the face/tongue/throat, SOB, or low BP? Yes Did it involve sudden or severe rash/hives, skin peeling, or any reaction on the inside of your mouth or nose? No Did you need to seek medical attention at a hospital or doctor's office? No When did it last happen?<less than 10 years If all above answers are "NO", may proceed with cephalosporin use.     Antimicrobials this admission: 8/3 Remdesivir >> 8/7  Microbiology results: 8/3 BCx: 1/4 CONS - likely contaminant  Thank you for allowing pharmacy to be a part of this patient's care.  Sherlon Handing, PharmD, BCPS CGV Clinical pharmacist phone [phone] 01/16/2019 4:12 PM

## 2019-01-16 NOTE — Plan of Care (Signed)
Will continue with plan of care. 

## 2019-01-16 NOTE — Progress Notes (Signed)
Arrived to [facility] via stretcher. Placed in bed, telemetry started. No needs at this time. Will continue with plan of care.   Noted ankle monitor on patients ankle. Stated the appropriate authorities know they are hospitalized.

## 2019-01-16 NOTE — Progress Notes (Signed)
Initial Nutrition Assessment  DOCUMENTATION CODES:   Morbid obesity  INTERVENTION:   Pt receiving Hormel Shake daily with Breakfast which provides 520 kcals and 22 g of protein and Magic cup BID with lunch and dinner, each supplement provides 290 kcal and 9 grams of protein, automatically on meal trays to optimize nutritional intake.   Encourage PO intake  NUTRITION DIAGNOSIS:   Increased nutrient needs related to acute illness(COVID) as evidenced by estimated needs.   GOAL:   Patient will meet greater than or equal to 90% of their needs  MONITOR:   PO intake, Supplement acceptance  REASON FOR ASSESSMENT:   Malnutrition Screening Tool    ASSESSMENT:   Pt with PMH of HTN, DM, lupus, CAD, and afib who is with COVID-19 PNA after one week of SOB and abd pain.   Pt lives with daughter and son-in-law.  Pt on 4L via [place]  Medications reviewed and include: NPH 2 units BID Labs reviewed   NUTRITION - FOCUSED PHYSICAL EXAM:  Deferred   Diet Order:   Diet Order            Diet Carb Modified Fluid consistency: Thin; Room service appropriate? Yes  Diet effective now              EDUCATION NEEDS:   No education needs have been identified at this time  Skin:  Skin Assessment: Reviewed RN Assessment  Last BM:  8/2  Height:   Ht Readings from Last 1 Encounters:  01/15/19 5\' 4"  (1.626 m)    Weight:   Wt Readings from Last 1 Encounters:  01/15/19 121.1 kg    Ideal Body Weight:     BMI:  Body mass index is 45.83 kg/m.  Estimated Nutritional Needs:   Kcal:  2000-2200  Protein:  100-115 grams  Fluid:  > 2L/day  Maylon Peppers RD, [place], [place] Pager [phone] After Hours Pager

## 2019-01-16 NOTE — Progress Notes (Addendum)
PROGRESS NOTE    Stacey Burns  ZOX:[id number] DOB: Aug 15, 1955 DOA: 01/15/2019 PCP: Lenord Fellershurch, Shea N, PA-C      Brief Narrative:  Mrs. Stacey Burns is a 63 y.o. F with lupus on Imuran, DM on insulin, HTN, CAD, and A. fib on Eliquis who presents with 1 week chest discomfort, abdominal cramps, and progressive shortness of breath.  In the ER she required 2 L supplemental oxygen, chest x-ray showed multifocal pneumonia, SARS-CoV-2 testing positive.      Assessment & Plan:  Coronavirus pneumonitis with acute hypoxic respiratory failure In setting of ongoing 2020 COVID-19 pandemic.  Not Actemra candidate with Imuran. Requires 5L O2  -Continue remdesivir day 1 of 5 -Continue steroids day 1 -VTE PPx with Lovenox -Continue Zinc and Vitamin C -Daily d-dimer, ferritin and CRP  COVID-19 Labs Recent Labs    01/15/19 1444  DDIMER 0.33  FERRITIN 143  LDH 333*  CRP 16.9*     Diabetes -Continue NPH -Continue SSI corrections -Hold Ozempic   Hypertension Coronary disease BP soft -Hold Lasix, metoprolol, lisinopril  Atrial fibrillation CHA2DS2-Vasc 3 for age HTN, ASCVD.  H&P mentions "coagulation disorder" although I see no mention of this in her Rheum or Cards notes in CareEverywhere. 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  Low disease burden since then. -Hold Imuran  Other medications -Continue baclofen  COPD -Continue Incruse -Albuterol PRN   Sleep apnea -CPAP if permitted      MDM and disposition: The below labs and imaging reports were reviewed and summarized above.  Medication management as above.  The patient was admitted with COVID-19.  This is a severe acute illness with threat to life or bodily function.  At this time, continued inpatient services are reasonable and expected, given the patient's: Severity of presentation: COVID-19 infection, age 11>60, co-morbid asthma, Lupus and Afib on antigocaulation, ongoing use of 5L O2,  ongoing IV steroids and remdesivir, and the high likelihood of an adverse outcome, including readmission, debility or death if the patient were to be discharged prematurely.     DVT prophylaxis: N/A on Eliquis Code Status: FULL Family Communication:      Culture data:   8/2 blood culture x2 NGTD       Subjective: Feeling tired, out of breath with minimal exertion, coughing a lot.  Objective: Vitals:   01/16/19 0714 01/16/19 0740 01/16/19 1215 01/16/19 1315  BP: (!) 74/55 (!) 119/59 (!) 83/70 108/62  Pulse:  88  94  Resp:  (!) 31 18   Temp:   98.7 F (37.1 C)   TempSrc:   Axillary   SpO2: (!) 82% (!) 87% 90% (!) 88%  Weight:      Height:        Intake/Output Summary (Last 24 hours) at 01/16/2019 1358 Last data filed at 01/16/2019 0953 Gross per 24 hour  Intake 1360 ml  Output 200 ml  Net 1160 ml   Filed Weights   01/15/19 0826  Weight: 121.1 kg    Examination: General appearance: obese adult female, alert and in mild respiratory distress.   HEENT: Anicteric, conjunctiva pink, lids and lashes normal. No nasal deformity, discharge, epistaxis.  Lips moist.   Skin: Warm and dry.  no jaundice.  No suspicious rashes or lesions. Cardiac: RRR, nl S1-S2, no murmurs appreciated.  Capillary refill is brisk.  JVP not visible.  No LE edema.  Radial pulses 2+ and symmetric. Respiratory: Tachypneic, no wheezing or rales. Abdomen: Abdomen soft.  no TTP.  No ascites, distension, hepatosplenomegaly.   MSK: No deformities or effusions. Neuro: Awake and alert.  EOMI, moves all extremities. Speech fluent.    Psych: Sensorium intact and responding to questions, attention normal. Affect normal.  Judgment and insight appear normal.        Data Reviewed: I have personally reviewed following labs and imaging studies:  CBC: Recent Labs  Lab 01/15/19 0910  WBC 5.7  NEUTROABS 4.5  HGB 14.1  HCT 43.9  MCV 90.9  PLT 199   Basic Metabolic Panel: Recent Labs  Lab  01/15/19 0910  NA 137  K 3.7  CL 104  CO2 24  GLUCOSE 108*  BUN 10  CREATININE 0.89  CALCIUM 8.1*   GFR: Estimated Creatinine Clearance: 83 mL/min (by C-G formula based on SCr of 0.89 mg/dL). Liver Function Tests: Recent Labs  Lab 01/15/19 0910  AST 40  ALT 21  ALKPHOS 47  BILITOT 0.9  PROT 7.1  ALBUMIN 2.5*   Recent Labs  Lab 01/15/19 0910  LIPASE 43   No results for input(s): AMMONIA in the last 168 hours. Coagulation Profile: No results for input(s): INR, PROTIME in the last 168 hours. Cardiac Enzymes: No results for input(s): CKTOTAL, CKMB, CKMBINDEX, TROPONINI in the last 168 hours. BNP (last 3 results) No results for input(s): PROBNP in the last 8760 hours. HbA1C: Recent Labs    01/15/19 1444  HGBA1C 7.1*   CBG: Recent Labs  Lab 01/15/19 2206 01/16/19 0724 01/16/19 1213  GLUCAP 173* 115* 158*   Lipid Profile: Recent Labs    01/15/19 1444  TRIG 91   Thyroid Function Tests: No results for input(s): TSH, T4TOTAL, FREET4, T3FREE, THYROIDAB in the last 72 hours. Anemia Panel: Recent Labs    01/15/19 1444  FERRITIN 143   Urine analysis: No results found for: COLORURINE, APPEARANCEUR, LABSPEC, PHURINE, GLUCOSEU, HGBUR, BILIRUBINUR, KETONESUR, PROTEINUR, UROBILINOGEN, NITRITE, LEUKOCYTESUR Sepsis Labs: @LABRCNTIP (procalcitonin:4,lacticacidven:4)  ) Recent Results (from the past 240 hour(s))  SARS Coronavirus 2 [facility] order, Performed in [facility] hospital lab) Nasopharyngeal Nasopharyngeal Swab     Status: Abnormal   Collection Time: 01/15/19 10:35 AM   Specimen: Nasopharyngeal Swab  Result Value Ref Range Status   SARS Coronavirus 2 POSITIVE (A) NEGATIVE Final    Comment: CRITICAL RESULT CALLED TO, READ BACK BY AND VERIFIED WITH: RN ELIZABETH TAYLOR [phone] FCP (NOTE) If result is NEGATIVE SARS-CoV-2 target nucleic acids are NOT DETECTED. The SARS-CoV-2 RNA is generally detectable in upper and lower  respiratory specimens  during the acute phase of infection. The lowest  concentration of SARS-CoV-2 viral copies this assay can detect is 250  copies / mL. A negative result does not preclude SARS-CoV-2 infection  and should not be used as the sole basis for treatment or other  patient management decisions.  A negative result may occur with  improper specimen collection / handling, submission of specimen other  than nasopharyngeal swab, presence of viral mutation(s) within the  areas targeted by this assay, and inadequate number of viral copies  (<250 copies / mL). A negative result must be combined with clinical  observations, patient history, and epidemiological information. If result is POSITIVE SARS-CoV-2 target nucleic acids are DET ECTED. The SARS-CoV-2 RNA is generally detectable in upper and lower  respiratory specimens during the acute phase of infection.  Positive  results are indicative of active infection with SARS-CoV-2.  Clinical  correlation with patient history and other diagnostic information is  necessary to determine patient infection status.  Positive results do  not rule out bacterial infection or co-infection with other viruses. If result is PRESUMPTIVE POSTIVE SARS-CoV-2 nucleic acids MAY BE PRESENT.   A presumptive positive result was obtained on the submitted specimen  and confirmed on repeat testing.  While 2019 novel coronavirus  (SARS-CoV-2) nucleic acids may be present in the submitted sample  additional confirmatory testing may be necessary for epidemiological  and / or clinical management purposes  to differentiate between  SARS-CoV-2 and other Sarbecovirus currently known to infect humans.  If clinically indicated additional testing with an alternate test  methodology ([phone]) is advised. The SARS-CoV-2 RNA is generally  detectable in upper and lower respiratory specimens during the acute  phase of infection. The expected result is Negative. Fact Sheet for Patients:   BoilerBrush.com.cyhttps://www.fda.gov/media/136312/download Fact Sheet for Healthcare Providers: https://pope.com/https://www.fda.gov/media/136313/download This test is not yet approved or cleared by the [place] FDA and has been authorized for detection and/or diagnosis of SARS-CoV-2 by FDA under an Emergency Use Authorization (EUA).  This EUA will remain in effect (meaning this test can be used) for the duration of the COVID-19 declaration under Section 564(b)(1) of the Act, 21 U.S.C. section 360bbb-3(b)(1), unless the authorization is terminated or revoked sooner. Performed at [facility], [address] [place], [place] [address]   Blood Culture (routine x 2)     Status: None (Preliminary result)   Collection Time: 01/15/19  3:00 PM   Specimen: BLOOD  Result Value Ref Range Status   Specimen Description BLOOD RIGHT ANTECUBITAL  Final   Special Requests   Final    BOTTLES DRAWN AEROBIC AND ANAEROBIC Blood Culture adequate volume   Culture  Setup Time   Final    ANAEROBIC BOTTLE ONLY GRAM POSITIVE COCCI IN CLUSTERS Organism ID to follow    Culture   Final    NO GROWTH < 24 HOURS Performed at [facility], [address] [place], [place] [address]    Report Status PENDING  Incomplete  Blood Culture (routine x 2)     Status: None (Preliminary result)   Collection Time: 01/15/19  7:50 PM   Specimen: BLOOD RIGHT HAND  Result Value Ref Range Status   Specimen Description BLOOD RIGHT HAND  Final   Special Requests   Final    BOTTLES DRAWN AEROBIC AND ANAEROBIC Blood Culture results may not be optimal due to an inadequate volume of blood received in culture bottles   Culture   Final    NO GROWTH < 24 HOURS Performed at [facility], [address] [place], [place] [address]    Report Status PENDING  Incomplete         Radiology Studies: Ct Angio Chest Pe W And/or Wo Contrast  Result Date: 01/15/2019 CLINICAL DATA:  EMS call for CP and SHOB x 1 week . Pt reported Pain increased  with movement. Pt has Dx of A-fib and a ablation the patient . Pt given 324 ASA and one NGT SL . BP 120/70 prior to meds and BP 74/40 after meds. On arrival EMS reported BP 120/70. Pt reports relief of CP From meds. HR 80, CBG 137, 97.2 temp. EXAM: CT ANGIOGRAPHY CHEST CT ABDOMEN AND PELVIS WITH CONTRAST TECHNIQUE: Multidetector CT imaging of the chest was performed using the standard protocol during bolus administration of intravenous contrast. Multiplanar CT image reconstructions and MIPs were obtained to evaluate the vascular anatomy. Multidetector CT imaging of the abdomen and pelvis was performed  using the standard protocol during bolus administration of intravenous contrast. CONTRAST:  146mL OMNIPAQUE IOHEXOL 350 MG/ML SOLN COMPARISON:  None. FINDINGS: CTA CHEST FINDINGS Cardiovascular: There is satisfactory opacification of the pulmonary arteries to the segmental level. There is no evidence of a pulmonary embolism. Heart is normal in size. No pericardial effusion. Minimal three-vessel coronary artery calcifications. Great vessels are normal in caliber. Mild aortic atherosclerosis. No dissection. Mediastinum/Nodes: Large hiatal hernia, lying to the right of the lower thoracic spine. Esophagus unremarkable. Trachea widely patent. Normal thyroid. No neck base, axillary, mediastinal or hilar masses or enlarged lymph nodes. Lungs/Pleura: Bilateral, peripheral ground-glass airspace opacities. Slight upper lobe predominance. No lung masses or suspicious nodules. No interstitial thickening. No pleural effusion or pneumothorax. Musculoskeletal: No fracture or acute finding. No osteoblastic or osteolytic lesions. Review of the MIP images confirms the above findings. CT ABDOMEN and PELVIS FINDINGS Hepatobiliary: Decreased attenuation of the liver consistent with fatty infiltration. No liver mass or focal lesion. Liver normal in size. Normal gallbladder. No bile duct dilation. Pancreas: Unremarkable. No pancreatic ductal  dilatation or surrounding inflammatory changes. Spleen: Normal in size without focal abnormality. Adrenals/Urinary Tract: Adrenal glands are unremarkable. Kidneys are normal, without renal calculi, focal lesion, or hydronephrosis. Bladder is unremarkable. Stomach/Bowel: Stomach unremarkable other than the large hiatal hernia. Small bowel and colon are normal in caliber. No wall thickening. No inflammation. There are colonic diverticula mostly along the left colon. No diverticulitis. Normal appendix visualized. Vascular/Lymphatic: Mild aortic atherosclerosis.  No aneurysm. No enlarged lymph nodes. Reproductive: Small partly calcified uterine fibroids. Uterus normal in overall size. No ovarian/adnexal masses. Other: No abdominal wall hernia or abnormality. No abdominopelvic ascites. Musculoskeletal: No fracture or acute finding. No osteoblastic or osteolytic lesions. Review of the MIP images confirms the above findings. IMPRESSION: CTA CHEST 1. No evidence of a pulmonary embolism. 2. Bilateral peripheral ground-glass airspace lung opacities with a slight upper lobe predominance. Findings consistent with multifocal infection. Consider atypical infection including viral infection. Noninfectious lung inflammation may also have this appearance. 3. No other acute findings in the chest. 4. Large hiatal hernia. CT ABDOMEN AND PELVIS 1. No acute findings within the abdomen or pelvis. 2. Hepatic steatosis. 3. Aortic atherosclerosis. 4. Scattered colonic diverticula without diverticulitis. Electronically Signed   By: Lajean Manes M.D.   On: 01/15/2019 12:01   Ct Abdomen Pelvis W Contrast  Result Date: 01/15/2019 CLINICAL DATA:  EMS call for CP and SHOB x 1 week . Pt reported Pain increased with movement. Pt has Dx of A-fib and a ablation the patient . Pt given 324 ASA and one NGT SL . BP 120/70 prior to meds and BP 74/40 after meds. On arrival EMS reported BP 120/70. Pt reports relief of CP From meds. HR 80, CBG 137, 97.2  temp. EXAM: CT ANGIOGRAPHY CHEST CT ABDOMEN AND PELVIS WITH CONTRAST TECHNIQUE: Multidetector CT imaging of the chest was performed using the standard protocol during bolus administration of intravenous contrast. Multiplanar CT image reconstructions and MIPs were obtained to evaluate the vascular anatomy. Multidetector CT imaging of the abdomen and pelvis was performed using the standard protocol during bolus administration of intravenous contrast. CONTRAST:  135mL OMNIPAQUE IOHEXOL 350 MG/ML SOLN COMPARISON:  None. FINDINGS: CTA CHEST FINDINGS Cardiovascular: There is satisfactory opacification of the pulmonary arteries to the segmental level. There is no evidence of a pulmonary embolism. Heart is normal in size. No pericardial effusion. Minimal three-vessel coronary artery calcifications. Great vessels are normal in caliber. Mild aortic atherosclerosis. No dissection.  Mediastinum/Nodes: Large hiatal hernia, lying to the right of the lower thoracic spine. Esophagus unremarkable. Trachea widely patent. Normal thyroid. No neck base, axillary, mediastinal or hilar masses or enlarged lymph nodes. Lungs/Pleura: Bilateral, peripheral ground-glass airspace opacities. Slight upper lobe predominance. No lung masses or suspicious nodules. No interstitial thickening. No pleural effusion or pneumothorax. Musculoskeletal: No fracture or acute finding. No osteoblastic or osteolytic lesions. Review of the MIP images confirms the above findings. CT ABDOMEN and PELVIS FINDINGS Hepatobiliary: Decreased attenuation of the liver consistent with fatty infiltration. No liver mass or focal lesion. Liver normal in size. Normal gallbladder. No bile duct dilation. Pancreas: Unremarkable. No pancreatic ductal dilatation or surrounding inflammatory changes. Spleen: Normal in size without focal abnormality. Adrenals/Urinary Tract: Adrenal glands are unremarkable. Kidneys are normal, without renal calculi, focal lesion, or hydronephrosis.  Bladder is unremarkable. Stomach/Bowel: Stomach unremarkable other than the large hiatal hernia. Small bowel and colon are normal in caliber. No wall thickening. No inflammation. There are colonic diverticula mostly along the left colon. No diverticulitis. Normal appendix visualized. Vascular/Lymphatic: Mild aortic atherosclerosis.  No aneurysm. No enlarged lymph nodes. Reproductive: Small partly calcified uterine fibroids. Uterus normal in overall size. No ovarian/adnexal masses. Other: No abdominal wall hernia or abnormality. No abdominopelvic ascites. Musculoskeletal: No fracture or acute finding. No osteoblastic or osteolytic lesions. Review of the MIP images confirms the above findings. IMPRESSION: CTA CHEST 1. No evidence of a pulmonary embolism. 2. Bilateral peripheral ground-glass airspace lung opacities with a slight upper lobe predominance. Findings consistent with multifocal infection. Consider atypical infection including viral infection. Noninfectious lung inflammation may also have this appearance. 3. No other acute findings in the chest. 4. Large hiatal hernia. CT ABDOMEN AND PELVIS 1. No acute findings within the abdomen or pelvis. 2. Hepatic steatosis. 3. Aortic atherosclerosis. 4. Scattered colonic diverticula without diverticulitis. Electronically Signed   By: Amie Portlandavid  Ormond M.D.   On: 01/15/2019 12:01   Dg Chest Port 1 View  Result Date: 01/15/2019 CLINICAL DATA:  Fall 7/25, chest pain/SOB since then. Hx of stroke, lupus, hypertension, diabetes, coronary artery disease, asthma, afib. Nonsmoker. EXAM: PORTABLE CHEST 1 VIEW COMPARISON:  None. FINDINGS: Cardiac silhouette is mildly enlarged. No mediastinal or hilar masses. Subtle hazy airspace opacities are noted in the peripheral left mid and lower lung, possibly in the peripheral right mid lung. There are mildly prominent bronchovascular markings diffusely. No convincing pleural effusion.  No pneumothorax. Skeletal structures are grossly intact.  IMPRESSION: 1. Subtle hazy airspace opacities in the left mid and lower lung and possibly right peripheral mid lung. Consider multifocal pneumonia if there are consistent clinical findings. Electronically Signed   By: Amie Portlandavid  Ormond M.D.   On: 01/15/2019 10:26        Scheduled Meds:  apixaban  5 mg Oral BID   baclofen  10 mg Oral QHS   insulin aspart  0-15 Units Subcutaneous TID WC   insulin aspart  0-5 Units Subcutaneous QHS   insulin NPH Human  2 Units Subcutaneous BID AC & HS   sodium chloride flush  3 mL Intravenous Q12H   umeclidinium bromide  1 puff Inhalation Daily   Continuous Infusions:   LOS: 0 days    Time spent: 35 minutes      Alberteen Samhristopher P Laqueta Bonaventura, MD [facility] 01/16/2019, 1:58 PM     Please page through AMION:  [email or web address] If 7PM-7AM, please contact night-coverage

## 2019-01-17 DIAGNOSIS — J9601 Acute respiratory failure with hypoxia: Secondary | ICD-10-CM

## 2019-01-17 DIAGNOSIS — Z9989 Dependence on other enabling machines and devices: Secondary | ICD-10-CM

## 2019-01-17 DIAGNOSIS — E785 Hyperlipidemia, unspecified: Secondary | ICD-10-CM

## 2019-01-17 LAB — GLUCOSE, CAPILLARY
Glucose-Capillary: 232 mg/dL — ABNORMAL HIGH (ref 70–99)
Glucose-Capillary: 174 mg/dL — ABNORMAL HIGH (ref 70–99)
Glucose-Capillary: 163 mg/dL — ABNORMAL HIGH (ref 70–99)
Glucose-Capillary: 172 mg/dL — ABNORMAL HIGH (ref 70–99)
Glucose-Capillary: 206 mg/dL — ABNORMAL HIGH (ref 70–99)
Glucose-Capillary: 217 mg/dL — ABNORMAL HIGH (ref 70–99)

## 2019-01-17 LAB — D-DIMER, QUANTITATIVE: D-Dimer, Quant: 0.56 ug/mL-FEU — ABNORMAL HIGH (ref 0.00–0.50)

## 2019-01-17 LAB — FERRITIN: Ferritin: 165 ng/mL (ref 11–307)

## 2019-01-17 LAB — CK: Total CK: 256 U/L — ABNORMAL HIGH (ref 38–234)

## 2019-01-17 LAB — CBC WITH DIFFERENTIAL/PLATELET
Abs Immature Granulocytes: 0.05 10*3/uL (ref 0.00–0.07)
Basophils Absolute: 0 10*3/uL (ref 0.0–0.1)
Basophils Relative: 0 %
Eosinophils Absolute: 0 10*3/uL (ref 0.0–0.5)
Eosinophils Relative: 0 %
HCT: 38.7 % (ref 36.0–46.0)
Hemoglobin: 12.6 g/dL (ref 12.0–15.0)
Immature Granulocytes: 1 %
Lymphocytes Relative: 4 %
Lymphs Abs: 0.3 10*3/uL — ABNORMAL LOW (ref 0.7–4.0)
MCH: 28.9 pg (ref 26.0–34.0)
MCHC: 32.6 g/dL (ref 30.0–36.0)
MCV: 88.8 fL (ref 80.0–100.0)
Monocytes Absolute: 0.3 10*3/uL (ref 0.1–1.0)
Monocytes Relative: 4 %
Neutro Abs: 7.7 10*3/uL (ref 1.7–7.7)
Neutrophils Relative %: 91 %
Platelets: 215 10*3/uL (ref 150–400)
RBC: 4.36 MIL/uL (ref 3.87–5.11)
RDW: 17.2 % — ABNORMAL HIGH (ref 11.5–15.5)
WBC: 8.4 10*3/uL (ref 4.0–10.5)
nRBC: 0 % (ref 0.0–0.2)

## 2019-01-17 LAB — C-REACTIVE PROTEIN: CRP: 26.6 mg/dL — ABNORMAL HIGH (ref <1.0)

## 2019-01-17 LAB — COMPREHENSIVE METABOLIC PANEL
ALT: 17 U/L (ref 0–44)
AST: 34 U/L (ref 15–41)
Albumin: 2.4 g/dL — ABNORMAL LOW (ref 3.5–5.0)
Alkaline Phosphatase: 38 U/L (ref 38–126)
Anion gap: 8 (ref 5–15)
BUN: 12 mg/dL (ref 8–23)
CO2: 27 mmol/L (ref 22–32)
Calcium: 8.3 mg/dL — ABNORMAL LOW (ref 8.9–10.3)
Chloride: 103 mmol/L (ref 98–111)
Creatinine, Ser: 0.75 mg/dL (ref 0.44–1.00)
GFR calc Af Amer: >60 mL/min (ref >60)
GFR calc non Af Amer: >60 mL/min (ref >60)
Glucose, Bld: 196 mg/dL — ABNORMAL HIGH (ref 70–99)
Potassium: 4.7 mmol/L (ref 3.5–5.1)
Sodium: 138 mmol/L (ref 135–145)
Total Bilirubin: 0.3 mg/dL (ref 0.3–1.2)
Total Protein: 7.2 g/dL (ref 6.5–8.1)

## 2019-01-17 LAB — PHOSPHORUS: Phosphorus: 2.1 mg/dL — ABNORMAL LOW (ref 2.5–4.6)

## 2019-01-17 LAB — CULTURE, BLOOD (ROUTINE X 2): Special Requests: ADEQUATE

## 2019-01-17 LAB — LACTIC ACID, PLASMA: Lactic Acid, Venous: 1.9 mmol/L (ref 0.5–1.9)

## 2019-01-17 LAB — MAGNESIUM: Magnesium: 2.1 mg/dL (ref 1.7–2.4)

## 2019-01-17 LAB — TRIGLYCERIDES: Triglycerides: 70 mg/dL (ref <150)

## 2019-01-17 MED ORDER — ALPRAZOLAM 0.25 MG PO TABS: 0.2500 mg | ORAL_TABLET | Freq: Three times a day (TID) | ORAL | Status: DC | PRN

## 2019-01-17 MED ORDER — ALPRAZOLAM 0.5 MG PO TABS
0.5000 mg | ORAL_TABLET | Freq: Three times a day (TID) | ORAL | Status: DC | PRN
  Administered 2019-01-17 – 2019-01-19 (×3): 0.5 mg via ORAL
  Filled 2019-01-17 (×3): qty 1

## 2019-01-17 MED ORDER — INSULIN NPH (HUMAN) (ISOPHANE) 100 UNIT/ML ~~LOC~~ SUSP
4.0000 [IU] | Freq: Two times a day (BID) | SUBCUTANEOUS | Status: DC
  Administered 2019-01-17 – 2019-01-18 (×3): 4 [IU] via SUBCUTANEOUS
  Filled 2019-01-17: qty 10

## 2019-01-17 MED ORDER — LEVALBUTEROL TARTRATE 45 MCG/ACT IN AERO
2.0000 | INHALATION_SPRAY | Freq: Four times a day (QID) | RESPIRATORY_TRACT | Status: DC | PRN
  Administered 2019-01-17 – 2019-01-22 (×4): 2 via RESPIRATORY_TRACT
  Filled 2019-01-17: qty 15

## 2019-01-17 MED ORDER — METOPROLOL TARTRATE 25 MG PO TABS
12.5000 mg | ORAL_TABLET | Freq: Two times a day (BID) | ORAL | Status: DC
  Administered 2019-01-17 – 2019-01-18 (×2): 12.5 mg via ORAL
  Filled 2019-01-17 (×2): qty 1

## 2019-01-17 MED ORDER — METOPROLOL TARTRATE 5 MG/5ML IV SOLN
5.0000 mg | Freq: Once | INTRAVENOUS | Status: AC
  Administered 2019-01-17: 5 mg via INTRAVENOUS
  Filled 2019-01-17: qty 5

## 2019-01-17 MED ORDER — LORAZEPAM 2 MG/ML IJ SOLN
0.7500 mg | Freq: Once | INTRAMUSCULAR | Status: AC
  Administered 2019-01-17: 0.75 mg via INTRAVENOUS
  Filled 2019-01-17: qty 1

## 2019-01-17 NOTE — Discharge Instructions (Addendum)
Person Under Monitoring Name: Stacey Burns  Location: [address] [place] [id number]   Infection Prevention Recommendations for Individuals Confirmed to have, or Being Evaluated for, 2019 Novel Coronavirus (COVID-19) Infection Who Receive Care at Home  Individuals who are confirmed to have, or are being evaluated for, COVID-19 should follow the prevention steps below until a healthcare provider or local or state health department says they can return to normal activities.  Stay home except to get medical care You should restrict activities outside your home, except for getting medical care. Do not go to work, school, or public areas, and do not use public transportation or taxis.  Call ahead before visiting your doctor Before your medical appointment, call the healthcare provider and tell them that you have, or are being evaluated for, COVID-19 infection. This will help the healthcare providers office take steps to keep other people from getting infected. Ask your healthcare provider to call the local or state health department.  Monitor your symptoms Seek prompt medical attention if your illness is worsening (e.g., difficulty breathing). Before going to your medical appointment, call the healthcare provider and tell them that you have, or are being evaluated for, COVID-19 infection. Ask your healthcare provider to call the local or state health department.  Wear a facemask You should wear a facemask that covers your nose and mouth when you are in the same room with other people and when you visit a healthcare provider. People who live with or visit you should also wear a facemask while they are in the same room with you.  Separate yourself from other people in your home As much as possible, you should stay in a different room from other people in your home. Also, you should use a separate bathroom, if available.  Avoid sharing household items You should not  share dishes, drinking glasses, cups, eating utensils, towels, bedding, or other items with other people in your home. After using these items, you should wash them thoroughly with soap and water.  Cover your coughs and sneezes Cover your mouth and nose with a tissue when you cough or sneeze, or you can cough or sneeze into your sleeve. Throw used tissues in a lined trash can, and immediately wash your hands with soap and water for at least 20 seconds or use an alcohol-based hand rub.  Wash your [employer] Wash your hands often and thoroughly with soap and water for at least 20 seconds. You can use an alcohol-based hand sanitizer if soap and water are not available and if your hands are not visibly dirty. Avoid touching your eyes, nose, and mouth with unwashed hands.   Prevention Steps for Caregivers and Household Members of Individuals Confirmed to have, or Being Evaluated for, COVID-19 Infection Being Cared for in the Home  If you live with, or provide care at home for, a person confirmed to have, or being evaluated for, COVID-19 infection please follow these guidelines to prevent infection:  Follow healthcare providers instructions Make sure that you understand and can help the patient follow any healthcare provider instructions for all care.  Provide for the patients basic needs You should help the patient with basic needs in the home and provide support for getting groceries, prescriptions, and other personal needs.  Monitor the patients symptoms If they are getting sicker, call his or her medical provider and tell them that the patient has, or is being evaluated for, COVID-19 infection. This will help the healthcare providers office  take steps to keep other people from getting infected. Ask the healthcare provider to call the local or state health department.  Limit the number of people who have contact with the patient  If possible, have only one caregiver for the  patient.  Other household members should stay in another home or place of residence. If this is not possible, they should stay  in another room, or be separated from the patient as much as possible. Use a separate bathroom, if available.  Restrict visitors who do not have an essential need to be in the home.  Keep older adults, very young children, and other sick people away from the patient Keep older adults, very young children, and those who have compromised immune systems or chronic health conditions away from the patient. This includes people with chronic heart, lung, or kidney conditions, diabetes, and cancer.  Ensure good ventilation Make sure that shared spaces in the home have good air flow, such as from an air conditioner or an opened window, weather permitting.  Wash your hands often  Wash your hands often and thoroughly with soap and water for at least 20 seconds. You can use an alcohol based hand sanitizer if soap and water are not available and if your hands are not visibly dirty.  Avoid touching your eyes, nose, and mouth with unwashed hands.  Use disposable paper towels to dry your hands. If not available, use dedicated cloth towels and replace them when they become wet.  Wear a facemask and gloves  Wear a disposable facemask at all times in the room and gloves when you touch or have contact with the patients blood, body fluids, and/or secretions or excretions, such as sweat, saliva, sputum, nasal mucus, vomit, urine, or feces.  Ensure the mask fits over your nose and mouth tightly, and do not touch it during use.  Throw out disposable facemasks and gloves after using them. Do not reuse.  Wash your hands immediately after removing your facemask and gloves.  If your personal clothing becomes contaminated, carefully remove clothing and launder. Wash your hands after handling contaminated clothing.  Place all used disposable facemasks, gloves, and other waste in a lined  container before disposing them with other household waste.  Remove gloves and wash your hands immediately after handling these items.  Do not share dishes, glasses, or other household items with the patient  Avoid sharing household items. You should not share dishes, drinking glasses, cups, eating utensils, towels, bedding, or other items with a patient who is confirmed to have, or being evaluated for, COVID-19 infection.  After the person uses these items, you should wash them thoroughly with soap and water.  Wash laundry thoroughly  Immediately remove and wash clothes or bedding that have blood, body fluids, and/or secretions or excretions, such as sweat, saliva, sputum, nasal mucus, vomit, urine, or feces, on them.  Wear gloves when handling laundry from the patient.  Read and follow directions on labels of laundry or clothing items and detergent. In general, wash and dry with the warmest temperatures recommended on the label.  Clean all areas the individual has used often  Clean all touchable surfaces, such as counters, tabletops, doorknobs, bathroom fixtures, toilets, phones, keyboards, tablets, and bedside tables, every day. Also, clean any surfaces that may have blood, body fluids, and/or secretions or excretions on them.  Wear gloves when cleaning surfaces the patient has come in contact with.  Use a diluted bleach solution (e.g., dilute bleach with 1 part  bleach and 10 parts water) or a household disinfectant with a label that says EPA-registered for coronaviruses. To make a bleach solution at home, add 1 tablespoon of bleach to 1 quart (4 cups) of water. For a larger supply, add  cup of bleach to 1 gallon (16 cups) of water.  Read labels of cleaning products and follow recommendations provided on product labels. Labels contain instructions for safe and effective use of the cleaning product including precautions you should take when applying the product, such as wearing gloves or  eye protection and making sure you have good ventilation during use of the product.  Remove gloves and wash hands immediately after cleaning.  Monitor yourself for signs and symptoms of illness Caregivers and household members are considered close contacts, should monitor their health, and will be asked to limit movement outside of the home to the extent possible. Follow the monitoring steps for close contacts listed on the symptom monitoring form.   ? If you have additional questions, contact your local health department or call the epidemiologist on call at [phone] (available 24/7). ? This guidance is subject to change. For the most up-to-date guidance from [facility], please refer to their website: [email or web address]   Information on my medicine - ELIQUIS (apixaban)  Why was Eliquis prescribed for you? Eliquis was prescribed for you to reduce the risk of a blood clot forming that can cause a stroke if you have a medical condition called atrial fibrillation (a type of irregular heartbeat).  What do You need to know about Eliquis ? Take your Eliquis TWICE DAILY - one tablet in the morning and one tablet in the evening with or without food. If you have difficulty swallowing the tablet whole please discuss with your pharmacist how to take the medication safely.  Take Eliquis exactly as prescribed by your doctor and DO NOT stop taking Eliquis without talking to the doctor who prescribed the medication.  Stopping may increase your risk of developing a stroke.  Refill your prescription before you run out.  After discharge, you should have regular check-up appointments with your healthcare provider that is prescribing your Eliquis.  In the future your dose may need to be changed if your kidney function or weight changes by a significant amount or as you get older.  What do you do if you miss a dose? If you miss a dose, take it as  soon as you remember on the same day and resume taking twice daily.  Do not take more than one dose of ELIQUIS at the same time to make up a missed dose.  Important Safety Information A possible side effect of Eliquis is bleeding. You should call your healthcare provider right away if you experience any of the following: ? Bleeding from an injury or your nose that does not stop. ? Unusual colored urine (red or dark brown) or unusual colored stools (red or black). ? Unusual bruising for unknown reasons. ? A serious fall or if you hit your head (even if there is no bleeding).  Some medicines may interact with Eliquis and might increase your risk of bleeding or clotting while on Eliquis. To help avoid this, consult your healthcare provider or pharmacist prior to using any new prescription or non-prescription medications, including herbals, vitamins, non-steroidal anti-inflammatory drugs (NSAIDs) and supplements.  This website has more information on Eliquis (apixaban): http://[email or web address].com/eliquis/home

## 2019-01-17 NOTE — Progress Notes (Addendum)
PROGRESS NOTE    Stacey Burns  TOI:[id number] DOB: [birth year] DOA: 01/15/2019 PCP: Salvatore Marvel, PA-C      Brief Narrative:  Stacey Burns is a 63 y.o. F with lupus on Imuran, DM on insulin, HTN, CAD, and A. fib on Eliquis who presents with 1 week chest discomfort, abdominal cramps, and progressive shortness of breath.  In the ER she required 2 L supplemental oxygen, chest x-ray showed multifocal pneumonia, SARS-CoV-2 testing positive.      Assessment & Plan:  Coronavirus pneumonitis with acute hypoxic respiratory failure In setting of ongoing 2020 COVID-19 pandemic.  Not Actemra candidate given Imuran.   O2 needs increasing through the day today. -Continue remdesivir day 2 of 5 -Continue steroids day 2 -Continue VTE PPx with apixaban -Continue Zinc and Vitamin C    Diabetes Glucoses on high side this AM. -Continue NPH, increase dose  -Continue SSI corrections -Hold home Ozempic   Hypertension Coronary disease BP normal when checked on leg. -Hold Lasix, metoprolol, lisinopril   Atrial fibrillation CHA2DS2-Vasc 3 for age HTN, ASCVD.  H&P mentions "coagulation disorder" although I see no mention of this in her Rheum or Cards notes in CareEverywhere. 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  Low disease burden since then. -Hold Imuran  Other medications -Continue baclofen  COPD No wheezing. -Continue Incruse -Albuterol PRN  Sleep apnea -CPAP not permitted due to COVID  Elevated lactic acid Positive blood culture Patient with 1/4 blood cultures positive for CoNS.   Without indwelling hardware, stongly suspect this is contaminant. Lactic acid was not from sepsis, but was from hypoxia.  Repeat lactate normal           MDM and disposition: Below labs and imaging reports reviewed and summarized above.  Medication management as above.  The patient was admitted with COVID-19 with  escalating oxygen requirements, now on 10 L supplemental oxygen.  This is a severe acute illness with threat to life or bodily function and is worsening.  She is partway through a course of remdesivir, and has been initiated on steroids.  Suspect her ultimate disposition will be to home, although this is likely >4 days from now.     DVT prophylaxis: N/A on Eliquis Code Status: FULL Family Communication: Daughter and brother by phone     Culture data:   8/2 blood culture x2 NGTD       Subjective: Still feeling quite tired, somewhat anxious.  She is fatigued with minimal exertion.  She has a frequent cough.  No sputum, no hemoptysis, no fever.       Objective: Vitals:   01/17/19 0315 01/17/19 0624 01/17/19 0717 01/17/19 1706  BP: 119/81  119/65 124/69  Pulse: (!) 127  80 80  Resp: 18  16   Temp: (!) 97.5 F (36.4 C)  (!) 97.5 F (36.4 C) 98.7 F (37.1 C)  TempSrc: Axillary  Oral Oral  SpO2: 92% 92% 93% (!) 89%  Weight:      Height:        Intake/Output Summary (Last 24 hours) at 01/17/2019 1736 Last data filed at 01/17/2019 1604 Gross per 24 hour  Intake 1440 ml  Output 4 ml  Net 1436 ml   Filed Weights   01/15/19 0826  Weight: 121.1 kg    Examination: General appearance: Obese adult female, sitting in recliner, appears tired but no acute distress. HEENT: Anicteric, conjunctival pink, lids and lashes normal.  No nasal  deformity, discharge, or epistaxis.  Lips moist, dentition normal, oropharynx moist, no oral lesions.  Hearing normal. Skin: Skin warm and dry, no suspicious rashes or lesions. Cardiac: Heart rate regular, no murmurs, JVP not visible, no lower extremity edema. Respiratory: Tachypnea, no rales or wheezing. Abdomen: Abdomen soft, no tenderness palpation, no ascites or distention. MSK: No deformities or effusions of the large joints of the upper lower extremities bilaterally. Neuro: Awake and alert, extraocular movements intact, moves all  extremities with normal strength coordination, speech fluent.    Psych: Sensorium intact and responding to questions, attention normal, affect normal, judgment and insight appear normal.       Data Reviewed: I have personally reviewed following labs and imaging studies:  CBC: Recent Labs  Lab 01/15/19 0910 01/17/19 0225  WBC 5.7 8.4  NEUTROABS 4.5 7.7  HGB 14.1 12.6  HCT 43.9 38.7  MCV 90.9 88.8  PLT 199 215   Basic Metabolic Panel: Recent Labs  Lab 01/15/19 0910 01/17/19 0225  NA 137 138  K 3.7 4.7  CL 104 103  CO2 24 27  GLUCOSE 108* 196*  BUN 10 12  CREATININE 0.89 0.75  CALCIUM 8.1* 8.3*  MG  --  2.1  PHOS  --  2.1*   GFR: Estimated Creatinine Clearance: 92.4 mL/min (by C-G formula based on SCr of 0.75 mg/dL). Liver Function Tests: Recent Labs  Lab 01/15/19 0910 01/17/19 0225  AST 40 34  ALT 21 17  ALKPHOS 47 38  BILITOT 0.9 0.3  PROT 7.1 7.2  ALBUMIN 2.5* 2.4*   Recent Labs  Lab 01/15/19 0910  LIPASE 43   No results for input(s): AMMONIA in the last 168 hours. Coagulation Profile: No results for input(s): INR, PROTIME in the last 168 hours. Cardiac Enzymes: Recent Labs  Lab 01/17/19 0225  CKTOTAL 256*   BNP (last 3 results) No results for input(s): PROBNP in the last 8760 hours. HbA1C: Recent Labs    01/15/19 1444  HGBA1C 7.1*   CBG: Recent Labs  Lab 01/16/19 2144 01/17/19 0545 01/17/19 0740 01/17/19 1144 01/17/19 1530  GLUCAP 232* 174* 163* 172* 206*   Lipid Profile: Recent Labs    01/15/19 1444 01/17/19 0225  TRIG 91 70   Thyroid Function Tests: No results for input(s): TSH, T4TOTAL, FREET4, T3FREE, THYROIDAB in the last 72 hours. Anemia Panel: Recent Labs    01/15/19 1444 01/17/19 0225  FERRITIN 143 165   Urine analysis: No results found for: COLORURINE, APPEARANCEUR, LABSPEC, PHURINE, GLUCOSEU, HGBUR, BILIRUBINUR, KETONESUR, PROTEINUR, UROBILINOGEN, NITRITE, LEUKOCYTESUR Sepsis Labs:  @[email or web address] (procalcitonin:4,lacticacidven:4)  ) Recent Results (from the past 240 hour(s))  SARS Coronavirus 2 [facility](Hospital order, Performed in [facility]) Nasopharyngeal Nasopharyngeal Swab     Status: Abnormal   Collection Time: 01/15/19 10:35 AM   Specimen: Nasopharyngeal Swab  Result Value Ref Range Status   SARS Coronavirus 2 POSITIVE (A) NEGATIVE Final    Comment: CRITICAL RESULT CALLED TO, READ BACK BY AND VERIFIED WITH: RN ELIZABETH TAYLOR [phone] [id number] FCP (NOTE) If result is NEGATIVE SARS-CoV-2 target nucleic acids are NOT DETECTED. The SARS-CoV-2 RNA is generally detectable in upper and lower  respiratory specimens during the acute phase of infection. The lowest  concentration of SARS-CoV-2 viral copies this assay can detect is 250  copies / mL. A negative result does not preclude SARS-CoV-2 infection  and should not be used as the sole basis for treatment or other  patient management decisions.  A negative result may occur with  improper specimen collection / handling, submission of specimen other  than nasopharyngeal swab, presence of viral mutation(s) within the  areas targeted by this assay, and inadequate number of viral copies  (<250 copies / mL). A negative result must be combined with clinical  observations, patient history, and epidemiological information. If result is POSITIVE SARS-CoV-2 target nucleic acids are DET ECTED. The SARS-CoV-2 RNA is generally detectable in upper and lower  respiratory specimens during the acute phase of infection.  Positive  results are indicative of active infection with SARS-CoV-2.  Clinical  correlation with patient history and other diagnostic information is  necessary to determine patient infection status.  Positive results do  not rule out bacterial infection or co-infection with other viruses. If result is PRESUMPTIVE POSTIVE SARS-CoV-2 nucleic acids MAY BE PRESENT.   A presumptive positive result was obtained on  the submitted specimen  and confirmed on repeat testing.  While 2019 novel coronavirus  (SARS-CoV-2) nucleic acids may be present in the submitted sample  additional confirmatory testing may be necessary for epidemiological  and / or clinical management purposes  to differentiate between  SARS-CoV-2 and other Sarbecovirus currently known to infect humans.  If clinically indicated additional testing with an alternate test  methodology (LA [id number]) is advised. The SARS-CoV-2 RNA is generally  detectable in upper and lower respiratory specimens during the acute  phase of infection. The expected result is Negative. Fact Sheet for Patients:  BoilerBrush.com.cyhttps://www.fda.gov/media/136312/download Fact Sheet for Healthcare Providers: https://pope.com/https://www.fda.gov/media/136313/download This test is not yet approved or cleared by the [place] FDA and has been authorized for detection and/or diagnosis of SARS-CoV-2 by FDA under an Emergency Use Authorization (EUA).  This EUA will remain in effect (meaning this test can be used) for the duration of the COVID-19 declaration under Section 564(b)(1) of the Act, 21 U.S.C. section 360bbb-3(b)(1), unless the authorization is terminated or revoked sooner. Performed at [facility], [address] [place], [place] [address]   Blood Culture (routine x 2)     Status: Abnormal   Collection Time: 01/15/19  3:00 PM   Specimen: BLOOD  Result Value Ref Range Status   Specimen Description BLOOD RIGHT ANTECUBITAL  Final   Special Requests   Final    BOTTLES DRAWN AEROBIC AND ANAEROBIC Blood Culture adequate volume   Culture  Setup Time   Final    ANAEROBIC BOTTLE ONLY GRAM POSITIVE COCCI IN CLUSTERS CRITICAL RESULT CALLED TO, READ BACK BY AND VERIFIED WITH: Lois HuxleyK. Patton PharmD 15:10 01/16/19 ([email or web address])    Culture (A)  Final    STAPHYLOCOCCUS SPECIES (COAGULASE NEGATIVE) THE SIGNIFICANCE OF ISOLATING THIS ORGANISM FROM A SINGLE SET OF BLOOD CULTURES WHEN MULTIPLE SETS ARE  DRAWN IS UNCERTAIN. PLEASE NOTIFY THE MICROBIOLOGY DEPARTMENT WITHIN ONE WEEK IF SPECIATION AND SENSITIVITIES ARE REQUIRED. Performed at [facility], [address] [place], [place] [address]    Report Status 01/17/2019 FINAL  Final  Blood Culture ID Panel (Reflexed)     Status: Abnormal   Collection Time: 01/15/19  3:00 PM  Result Value Ref Range Status   Enterococcus species NOT DETECTED NOT DETECTED Final   Listeria monocytogenes NOT DETECTED NOT DETECTED Final   Staphylococcus species DETECTED (A) NOT DETECTED Final    Comment: Methicillin (oxacillin) susceptible coagulase negative staphylococcus. Possible blood culture contaminant (unless isolated from more than one blood culture draw or clinical case suggests pathogenicity). No antibiotic treatment is indicated for blood  culture contaminants. CRITICAL RESULT CALLED TO, READ BACK  BY AND VERIFIED WITH: Lois HuxleyK. Patton PharmD 15:10 01/16/19 ([email or web address])    Staphylococcus aureus (BCID) NOT DETECTED NOT DETECTED Final   Methicillin resistance NOT DETECTED NOT DETECTED Final   Streptococcus species NOT DETECTED NOT DETECTED Final   Streptococcus agalactiae NOT DETECTED NOT DETECTED Final   Streptococcus pneumoniae NOT DETECTED NOT DETECTED Final   Streptococcus pyogenes NOT DETECTED NOT DETECTED Final   Acinetobacter baumannii NOT DETECTED NOT DETECTED Final   Enterobacteriaceae species NOT DETECTED NOT DETECTED Final   Enterobacter cloacae complex NOT DETECTED NOT DETECTED Final   Escherichia coli NOT DETECTED NOT DETECTED Final   Klebsiella oxytoca NOT DETECTED NOT DETECTED Final   Klebsiella pneumoniae NOT DETECTED NOT DETECTED Final   Proteus species NOT DETECTED NOT DETECTED Final   Serratia marcescens NOT DETECTED NOT DETECTED Final   Haemophilus influenzae NOT DETECTED NOT DETECTED Final   Neisseria meningitidis NOT DETECTED NOT DETECTED Final   Pseudomonas aeruginosa NOT DETECTED NOT DETECTED Final   Candida albicans NOT  DETECTED NOT DETECTED Final   Candida glabrata NOT DETECTED NOT DETECTED Final   Candida krusei NOT DETECTED NOT DETECTED Final   Candida parapsilosis NOT DETECTED NOT DETECTED Final   Candida tropicalis NOT DETECTED NOT DETECTED Final    Comment: Performed at [facility], [address] [place], [place] [address]  Blood Culture (routine x 2)     Status: None (Preliminary result)   Collection Time: 01/15/19  7:50 PM   Specimen: BLOOD RIGHT HAND  Result Value Ref Range Status   Specimen Description BLOOD RIGHT HAND  Final   Special Requests   Final    BOTTLES DRAWN AEROBIC AND ANAEROBIC Blood Culture results may not be optimal due to an inadequate volume of blood received in culture bottles   Culture   Final    NO GROWTH 2 DAYS Performed at [facility], [address] [place], [place] [address]    Report Status PENDING  Incomplete         Radiology Studies: No results found.      Scheduled Meds: . apixaban  5 mg Oral BID  . baclofen  10 mg Oral QHS  . dexamethasone (DECADRON) injection  6 mg Intravenous Daily  . insulin aspart  0-15 Units Subcutaneous TID WC  . insulin aspart  0-5 Units Subcutaneous QHS  . insulin NPH Human  4 Units Subcutaneous BID AC & HS  . sodium chloride flush  3 mL Intravenous Q12H  . umeclidinium bromide  1 puff Inhalation Daily   Continuous Infusions: . remdesivir 100 mg in NS 250 mL 100 mg (01/17/19 1604)     LOS: 1 day    Time spent: 35 minutes      Alberteen Samhristopher P Danford, MD [facility] 01/17/2019, 5:36 PM     Please page through AMION:  [email or web address] Password [email or web address] If 7PM-7AM, please contact night-coverage

## 2019-01-17 NOTE — Progress Notes (Signed)
Nurse Olivia Mackie spoke to family and updated them over the phone.

## 2019-01-17 NOTE — Plan of Care (Signed)
  Problem: Education: Goal: Knowledge of risk factors and measures for prevention of condition will improve Outcome: Progressing   Problem: Coping: Goal: Psychosocial and spiritual needs will be supported Outcome: Progressing   Problem: Respiratory: Goal: Will maintain a patent airway Outcome: Progressing Goal: Complications related to the disease process, condition or treatment will be avoided or minimized Outcome: Progressing   Problem: Education: Goal: Knowledge of General Education information will improve Description: Including pain rating scale, medication(s)/side effects and non-pharmacologic comfort measures Outcome: Progressing   Problem: Health Behavior/Discharge Planning: Goal: Ability to manage health-related needs will improve Outcome: Progressing   Problem: Clinical Measurements: Goal: Ability to maintain clinical measurements within normal limits will improve Outcome: Progressing Goal: Will remain free from infection Outcome: Progressing Goal: Respiratory complications will improve Outcome: Progressing   Problem: Activity: Goal: Risk for activity intolerance will decrease Outcome: Progressing

## 2019-01-17 NOTE — Progress Notes (Signed)
Patient ID: Stacey Burns, female   DOB: Jan 08, 1956, 63 y.o.   MRN: [id number]  Night shift MedSurg coverage note.  The staff has just reported that the patient becomes anxious and hyperventilates intermittently. Her oxygen requirement has progressively increased. She has been tachycardic on occasion as well.  Metoprolol 12.5 mg p.o. twice daily was held due to soft blood pressures. Depending on BP numbers may resume beta blocker later today. Lorazepam 0.75 mg IVP x1 dose, alprazolam 0.25 mg p.o. 3 times daily as needed and Xopenex MDI in place of albuterol MDI ordered.  Awaiting for electrolyte results and will optimize if required.  Tennis Must, MD

## 2019-01-18 ENCOUNTER — Inpatient Hospital Stay (HOSPITAL_COMMUNITY)

## 2019-01-18 DIAGNOSIS — I4891 Unspecified atrial fibrillation: Secondary | ICD-10-CM

## 2019-01-18 LAB — CBC WITH DIFFERENTIAL/PLATELET
Abs Immature Granulocytes: 0.03 10*3/uL (ref 0.00–0.07)
Basophils Absolute: 0 10*3/uL (ref 0.0–0.1)
Basophils Relative: 0 %
Eosinophils Absolute: 0 10*3/uL (ref 0.0–0.5)
Eosinophils Relative: 0 %
HCT: 39 % (ref 36.0–46.0)
Hemoglobin: 12.8 g/dL (ref 12.0–15.0)
Immature Granulocytes: 0 %
Lymphocytes Relative: 7 %
Lymphs Abs: 0.6 10*3/uL — ABNORMAL LOW (ref 0.7–4.0)
MCH: 28.8 pg (ref 26.0–34.0)
MCHC: 32.8 g/dL (ref 30.0–36.0)
MCV: 87.6 fL (ref 80.0–100.0)
Monocytes Absolute: 0.4 10*3/uL (ref 0.1–1.0)
Monocytes Relative: 5 %
Neutro Abs: 7.6 10*3/uL (ref 1.7–7.7)
Neutrophils Relative %: 88 %
Platelets: 274 10*3/uL (ref 150–400)
RBC: 4.45 MIL/uL (ref 3.87–5.11)
RDW: 17.2 % — ABNORMAL HIGH (ref 11.5–15.5)
WBC: 8.7 10*3/uL (ref 4.0–10.5)
nRBC: 0 % (ref 0.0–0.2)

## 2019-01-18 LAB — COMPREHENSIVE METABOLIC PANEL
ALT: 18 U/L (ref 0–44)
AST: 25 U/L (ref 15–41)
Albumin: 2.2 g/dL — ABNORMAL LOW (ref 3.5–5.0)
Alkaline Phosphatase: 41 U/L (ref 38–126)
Anion gap: 5 (ref 5–15)
BUN: 20 mg/dL (ref 8–23)
CO2: 30 mmol/L (ref 22–32)
Calcium: 8.2 mg/dL — ABNORMAL LOW (ref 8.9–10.3)
Chloride: 103 mmol/L (ref 98–111)
Creatinine, Ser: 0.7 mg/dL (ref 0.44–1.00)
GFR calc Af Amer: >60 mL/min (ref >60)
GFR calc non Af Amer: >60 mL/min (ref >60)
Glucose, Bld: 216 mg/dL — ABNORMAL HIGH (ref 70–99)
Potassium: 4.7 mmol/L (ref 3.5–5.1)
Sodium: 138 mmol/L (ref 135–145)
Total Bilirubin: 0.4 mg/dL (ref 0.3–1.2)
Total Protein: 7 g/dL (ref 6.5–8.1)

## 2019-01-18 LAB — POCT I-STAT 7, (LYTES, BLD GAS, ICA,H+H)
Acid-Base Excess: 3 mmol/L — ABNORMAL HIGH (ref 0.0–2.0)
Bicarbonate: 26 mmol/L (ref 20.0–28.0)
Calcium, Ion: 1.18 mmol/L (ref 1.15–1.40)
HCT: 39 % (ref 36.0–46.0)
Hemoglobin: 13.3 g/dL (ref 12.0–15.0)
O2 Saturation: 86 %
Patient temperature: 98.6
Potassium: 4.2 mmol/L (ref 3.5–5.1)
Sodium: 139 mmol/L (ref 135–145)
TCO2: 27 mmol/L (ref 22–32)
pCO2 arterial: 33.5 mmHg (ref 32.0–48.0)
pH, Arterial: 7.498 — ABNORMAL HIGH (ref 7.350–7.450)
pO2, Arterial: 46 mmHg — ABNORMAL LOW (ref 83.0–108.0)

## 2019-01-18 LAB — GLUCOSE, CAPILLARY
Glucose-Capillary: 183 mg/dL — ABNORMAL HIGH (ref 70–99)
Glucose-Capillary: 227 mg/dL — ABNORMAL HIGH (ref 70–99)
Glucose-Capillary: 267 mg/dL — ABNORMAL HIGH (ref 70–99)
Glucose-Capillary: 217 mg/dL — ABNORMAL HIGH (ref 70–99)

## 2019-01-18 LAB — MAGNESIUM: Magnesium: 2.2 mg/dL (ref 1.7–2.4)

## 2019-01-18 LAB — C-REACTIVE PROTEIN: CRP: 18.7 mg/dL — ABNORMAL HIGH (ref <1.0)

## 2019-01-18 LAB — INTERLEUKIN-6, PLASMA: Interleukin-6, Plasma: 37.1 pg/mL — ABNORMAL HIGH (ref 0.0–12.2)

## 2019-01-18 MED ORDER — AMIODARONE HCL 100 MG PO TABS
200.0000 mg | ORAL_TABLET | Freq: Two times a day (BID) | ORAL | Status: DC
  Administered 2019-01-18 – 2019-01-22 (×8): 200 mg via ORAL
  Filled 2019-01-18 (×8): qty 2

## 2019-01-18 MED ORDER — AMIODARONE HCL IN DEXTROSE 360-4.14 MG/200ML-% IV SOLN: 30.0000 mg/h | INTRAVENOUS | Status: DC

## 2019-01-18 MED ORDER — METOPROLOL TARTRATE 25 MG PO TABS
12.5000 mg | ORAL_TABLET | Freq: Two times a day (BID) | ORAL | Status: DC
  Administered 2019-01-18: 12.5 mg via ORAL
  Filled 2019-01-18: qty 1

## 2019-01-18 MED ORDER — FUROSEMIDE 10 MG/ML IJ SOLN
40.0000 mg | Freq: Once | INTRAMUSCULAR | Status: AC
  Administered 2019-01-18: 40 mg via INTRAVENOUS
  Filled 2019-01-18: qty 4

## 2019-01-18 MED ORDER — PANTOPRAZOLE SODIUM 40 MG PO TBEC
40.0000 mg | DELAYED_RELEASE_TABLET | Freq: Every day | ORAL | Status: DC
  Administered 2019-01-18 – 2019-01-27 (×10): 40 mg via ORAL
  Filled 2019-01-18 (×10): qty 1

## 2019-01-18 MED ORDER — INSULIN GLARGINE 100 UNIT/ML ~~LOC~~ SOLN
15.0000 [IU] | Freq: Every day | SUBCUTANEOUS | Status: DC
  Administered 2019-01-18 – 2019-01-19 (×2): 15 [IU] via SUBCUTANEOUS
  Filled 2019-01-18 (×2): qty 0.15

## 2019-01-18 MED ORDER — DIPHENHYDRAMINE HCL 25 MG PO CAPS: 25.0000 mg | ORAL_CAPSULE | Freq: Four times a day (QID) | ORAL | Status: DC | PRN

## 2019-01-18 MED ORDER — METOPROLOL TARTRATE 5 MG/5ML IV SOLN
5.0000 mg | Freq: Once | INTRAVENOUS | Status: DC
  Filled 2019-01-18: qty 5

## 2019-01-18 MED ORDER — INSULIN ASPART 100 UNIT/ML ~~LOC~~ SOLN
4.0000 [IU] | Freq: Three times a day (TID) | SUBCUTANEOUS | Status: DC
  Administered 2019-01-18 – 2019-01-21 (×8): 4 [IU] via SUBCUTANEOUS

## 2019-01-18 MED ORDER — METHYLPREDNISOLONE SODIUM SUCC 40 MG IJ SOLR
40.0000 mg | Freq: Three times a day (TID) | INTRAMUSCULAR | Status: DC
  Administered 2019-01-18 – 2019-01-23 (×15): 40 mg via INTRAVENOUS
  Filled 2019-01-18 (×15): qty 1

## 2019-01-18 MED ORDER — SODIUM CHLORIDE 0.9% FLUSH
10.0000 mL | Freq: Two times a day (BID) | INTRAVENOUS | Status: DC
  Administered 2019-01-18: 23:00:00 10 mL
  Administered 2019-01-19: 20 mL
  Administered 2019-01-19 – 2019-01-27 (×16): 10 mL

## 2019-01-18 MED ORDER — LIP MEDEX EX OINT
TOPICAL_OINTMENT | CUTANEOUS | Status: DC | PRN
  Filled 2019-01-18: qty 7

## 2019-01-18 MED ORDER — METOPROLOL TARTRATE 25 MG PO TABS: 25.0000 mg | ORAL_TABLET | Freq: Two times a day (BID) | ORAL | Status: DC

## 2019-01-18 MED ORDER — AMIODARONE LOAD VIA INFUSION
150.0000 mg | Freq: Once | INTRAVENOUS | Status: AC
  Administered 2019-01-18: 150 mg via INTRAVENOUS
  Filled 2019-01-18: qty 83.34

## 2019-01-18 MED ORDER — AMIODARONE HCL IN DEXTROSE 360-4.14 MG/200ML-% IV SOLN
60.0000 mg/h | INTRAVENOUS | Status: AC
  Administered 2019-01-18 (×2): 60 mg/h via INTRAVENOUS
  Filled 2019-01-18 (×2): qty 200

## 2019-01-18 MED ORDER — SODIUM CHLORIDE 0.9% FLUSH
10.0000 mL | INTRAVENOUS | Status: DC | PRN
  Administered 2019-01-22: 10 mL
  Filled 2019-01-18: qty 40

## 2019-01-18 NOTE — Progress Notes (Signed)
Pt. Converting back and forth between NSR with HR in the 60's to aflutter HR in the 130's to 140's: Had a brief episode of bradycardia with HR 38; Dr, Olevia Bowens made aware; documented in chart by tele

## 2019-01-18 NOTE — Progress Notes (Signed)
   Discussed case with Dr. Sloan Leiter.  Patient with AFib and atrial flutter.  Treated with beta blocker and has had some sinus bradycardia.  Tele strips reviewed.  Severe changes noted on CXR related to [place].    Will start IV Amiodarone to help maintain NSR and avoid post termination pauses.  May be able to decrease beta blocker dose if Amio is able to maintain NSR.  Jettie Booze, MD

## 2019-01-18 NOTE — Progress Notes (Signed)
Patient having heart rate sustaining in 130's in atrial flutter.  Order received for lopressor.  When about to give lopressor patient sat up to eat lunch and heart rate went back to 70's.  Lopressor held.

## 2019-01-18 NOTE — Progress Notes (Signed)
[facility] night shift telemetry coverage note.   The patient has had fluctuations in her HR from NSR in the 60s to non sustained Aflutter in the 130s. She also had a brief episode of bradycardia with a HR as low as 38 BPM. She received metoprolol 12.5 mg po last night. Metoprolol was held the previous night due to soft BP readings. Will wait for today's electrolytes results and correct as needed.  Tennis Must, MD

## 2019-01-18 NOTE — Progress Notes (Signed)
PROGRESS NOTE                                                                                                                                                                                                             Patient Demographics:    Stacey Burns, is a 63 y.o. female, DOB - [birth year], ZWC:[id number]  Outpatient Primary MD for the patient is Stacey Burns   Admit date - 01/15/2019   LOS - 2  Chief Complaint  Patient presents with   Chest Pain       Brief Narrative: Patient is a 63 y.o. female with PMHx of lupus on Imuran, DM, HTN, A. fib on Eliquis who presented with 1 week history of abdominal cramps, chest discomfort and progressive shortness of breath-she was found to have acute hypoxic respiratory failure in the setting of COVID-19 pneumonia.  Hospital course complicated by worsening hypoxemia requiring 15 L high flow oxygen.   Subjective:    Stacey Burns today is now on 15 L of oxygen.  She however is not in distress-easily speaking in full sentences.  She really does not feel much worse than yesterday.   Assessment  & Plan :   Acute Hypoxic Resp Failure due to Covid 19 Viral pneumonia: Worsening-with increasing FiO2 requirements.  She was on 15 L of high flow oxygen earlier this morning-with O2 sats mostly in the high 80s/low 90s.  Will switch to IV Solu-Medrol-continue Remdesivir.  Patient was on Imuran prior to this hospital stay-hence Actemra was not given-this MD spoke with pharmacy regarding use of Actemra-per pharmacy-really no evidence/major contraindication-if risks/benefits discussed with patient-and if willing to accept risk-okay to use Actemra in the setting of worsening COVID-19 pneumonia.  Subsequently this MD spoke with patient regarding off label use of Actemra, and at patient's request-spoke to her brother (who is a PhD doctor involved in research in [employer])-we  discussed worsening hypoxemia-declining overall trajectory-and the off label use of IL-6 inhibitor in this setting.  We discussed the rationale-potential benefit and side effects like possible future infections, hepatitis etc.-I encouraged the patient's brother to talk to other MDs and pharmacy in his institution-and then speak with other family members/patient and let us know if they consent to the use of Actemra.  In the meantime-we will give 1 dose of intravenous Lasix-although no  signs of volume overload-we will try to keep this patient in negative balance.  Have educated patient about the importance of prone position, and incentive spirometry.  Have asked the nursing staff to monitor closely-if oxygen requirements continue to worsen-or if she develops any signs of instability/confusion-we will transfer her to the ICU.  COVID-19 Labs: Recent Labs    01/15/19 1444 01/17/19 0225 01/18/19 0155  DDIMER 0.33 0.56*  --   FERRITIN 143 165  --   LDH 333*  --   --   CRP 16.9* 26.6* 18.7*    COVID-19 Medications: Steroids: Decadron 8/3-8/5, Solu-Medrol 8/5>> Remdesivir: 8/3>> Actemra: Discussed at length with patient, brother over the phone-see above Convalescent Plasma: N/A Research Studies: N/A  A. fib/atrial fibrillation with RVR and spells of bradycardia overnight: Continues to have RVR this morning-increase metoprolol to 25 mg twice daily.  Patient is s/p ablation procedure in 2018-and follows with cardiology at [facility].  Spoke with Dr. Eldridge Dace (cardiology) over the [facility] discussed and chart reviewed-recommendations are to start IV amiodarone, and then slowly taper off beta-blockers.  Continue Eliquis-recent 2D echo with preserved EF (reviewed report in care everywhere)  HTN: Controlled-continue metoprolol  Insulin-dependent DM-2: CBG slightly on the higher side as patient on steroids-we will switch to Lantus 15 units, add NovoLog 4 units with meals-continue SSI.   Follow and adjust accordingly  Lupus: Appears to be stable-apparently was diagnosed with pancolitis in 2015-continue to hold Imuran-see above regarding discussion about Actemra.  COPD/asthma: No wheezing-appears stable-continue as needed bronchodilators  OSA: Not on CPAP due to COVID-on high flow oxygen  Coagulase-negative Staphylococcus bacteremia times 1 out of 2 cultures: Likely contaminant-no indication of infection  Obesity: Estimated body mass index is 45.83 kg/m as calculated from the following:   Height as of this encounter: 5\' 4"  (1.626 m).   Weight as of this encounter: 121.1 kg.   ABG:    Component Value Date/Time   PHART 7.498 (H) 01/18/2019 1018   PCO2ART 33.5 01/18/2019 1018   PO2ART 46.0 (L) 01/18/2019 1018   HCO3 26.0 01/18/2019 1018   TCO2 27 01/18/2019 1018   O2SAT 86.0 01/18/2019 1018      Condition - Extremely Guarded-very tenuous with risk for further deterioration  Family Communication  :  Daughter/Brother updated over the phone  Code Status :  Full Code  Diet :  Diet Order            Diet Carb Modified Fluid consistency: Thin; Room service appropriate? Yes  Diet effective now               Disposition Plan  :  Remain hospitalized  Consults  :  None  Procedures  :  None  GI prophylaxis: PPI  DVT Prophylaxis  : Eliquis  Lab Results  Component Value Date   PLT 274 01/18/2019    Antibiotics  :    Anti-infectives (From admission, onward)   Start     Dose/Rate Route Frequency Ordered Stop   01/17/19 1700  remdesivir 100 mg in sodium chloride 0.9 % 250 mL IVPB     100 mg 500 mL/hr over 30 Minutes Intravenous Every 24 hours 01/16/19 1617 01/21/19 1659   01/16/19 1700  remdesivir 200 mg in sodium chloride 0.9 % 250 mL IVPB     200 mg 500 mL/hr over 30 Minutes Intravenous Once 01/16/19 1617 01/16/19 1824      Inpatient Medications  Scheduled Meds:  amiodarone  150 mg Intravenous Once  apixaban  5 mg Oral BID   baclofen  10  mg Oral QHS   insulin aspart  0-15 Units Subcutaneous TID WC   insulin aspart  0-5 Units Subcutaneous QHS   insulin aspart  4 Units Subcutaneous TID WC   insulin glargine  15 Units Subcutaneous Daily   methylPREDNISolone (SOLU-MEDROL) injection  40 mg Intravenous Q8H   metoprolol tartrate  5 mg Intravenous Once   metoprolol tartrate  25 mg Oral BID   sodium chloride flush  3 mL Intravenous Q12H   umeclidinium bromide  1 puff Inhalation Daily   Continuous Infusions:  amiodarone     Followed by   amiodarone     remdesivir 100 mg in NS 250 mL 100 mg (01/17/19 1604)   PRN Meds:.acetaminophen, ALPRAZolam, chlorpheniramine-HYDROcodone, guaiFENesin-dextromethorphan, levalbuterol, lip balm, ondansetron **OR** ondansetron (ZOFRAN) IV, oxyCODONE, zolpidem   Time Spent in minutes  45  The patient is critically ill with multiple organ system failure and requires high complexity decision making for assessment and support, frequent evaluation and titration of therapies, advanced monitoring, review of radiographic studies and interpretation of complex data.   See all Orders from today for further details   Jeoffrey MassedShanker Adamaris King M.D on 01/18/2019 at 12:26 PM  To page go to [email or web address] - use universal password  [facility] -  Office  [phone]    Objective:   Vitals:   01/17/19 2209 01/18/19 0350 01/18/19 0453 01/18/19 0743  BP:  (!) 126/92  (!) 114/57  Pulse: 66 86 63 66  Resp:  (!) 35  20  Temp:  98.6 F (37 C)  98.4 F (36.9 C)  TempSrc:  Oral  Oral  SpO2:  96%  93%  Weight:      Height:        Wt Readings from Last 3 Encounters:  01/15/19 121.1 kg  07/27/18 119.3 kg     Intake/Output Summary (Last 24 hours) at 01/18/2019 1226 Last data filed at 01/18/2019 1205 Gross per 24 hour  Intake 1200 ml  Output 651 ml  Net 549 ml     Physical Exam Gen Exam:Alert awake-not in any distress HEENT:atraumatic, normocephalic Chest: Bibasilar rales CVS:S1S2  regular Abdomen:soft non tender, non distended Extremities:no edema Neurology: Non focal Skin: no rash   Data Review:    CBC Recent Labs  Lab 01/15/19 0910 01/17/19 0225 01/18/19 0155 01/18/19 1018  WBC 5.7 8.4 8.7  --   HGB 14.1 12.6 12.8 13.3  HCT 43.9 38.7 39.0 39.0  PLT 199 215 274  --   MCV 90.9 88.8 87.6  --   MCH 29.2 28.9 28.8  --   MCHC 32.1 32.6 32.8  --   RDW 16.7* 17.2* 17.2*  --   LYMPHSABS 0.6* 0.3* 0.6*  --   MONOABS 0.5 0.3 0.4  --   EOSABS 0.0 0.0 0.0  --   BASOSABS 0.0 0.0 0.0  --     Chemistries  Recent Labs  Lab 01/15/19 0910 01/17/19 0225 01/18/19 0155 01/18/19 1018  NA 137 138 138 139  K 3.7 4.7 4.7 4.2  CL 104 103 103  --   CO2 24 27 30   --   GLUCOSE 108* 196* 216*  --   BUN 10 12 20   --   CREATININE 0.89 0.75 0.70  --   CALCIUM 8.1* 8.3* 8.2*  --   MG  --  2.1 2.2  --   AST 40 34 25  --   ALT 21 17  18  --   ALKPHOS 47 38 41  --   BILITOT 0.9 0.3 0.4  --    ------------------------------------------------------------------------------------------------------------------ Recent Labs    01/15/19 1444 01/17/19 0225  TRIG 91 70    Lab Results  Component Value Date   HGBA1C 7.1 (H) 01/15/2019   ------------------------------------------------------------------------------------------------------------------ No results for input(s): TSH, T4TOTAL, T3FREE, THYROIDAB in the last 72 hours.  Invalid input(s): FREET3 ------------------------------------------------------------------------------------------------------------------ Recent Labs    01/15/19 1444 01/17/19 0225  FERRITIN 143 165    Coagulation profile No results for input(s): INR, PROTIME in the last 168 hours.  Recent Labs    01/15/19 1444 01/17/19 0225  DDIMER 0.33 0.56*    Cardiac Enzymes No results for input(s): CKMB, TROPONINI, MYOGLOBIN in the last 168 hours.  Invalid input(s):  CK ------------------------------------------------------------------------------------------------------------------ No results found for: BNP  Micro Results Recent Results (from the past 240 hour(s))  SARS Coronavirus 2 [facility](Hospital order, Performed in [facility] lab) Nasopharyngeal Nasopharyngeal Swab     Status: Abnormal   Collection Time: 01/15/19 10:35 AM   Specimen: Nasopharyngeal Swab  Result Value Ref Range Status   SARS Coronavirus 2 POSITIVE (A) NEGATIVE Final    Comment: CRITICAL RESULT CALLED TO, READ BACK BY AND VERIFIED WITH: RN ELIZABETH TAYLOR [phone] [id number] FCP (NOTE) If result is NEGATIVE SARS-CoV-2 target nucleic acids are NOT DETECTED. The SARS-CoV-2 RNA is generally detectable in upper and lower  respiratory specimens during the acute phase of infection. The lowest  concentration of SARS-CoV-2 viral copies this assay can detect is 250  copies / mL. A negative result does not preclude SARS-CoV-2 infection  and should not be used as the sole basis for treatment or other  patient management decisions.  A negative result may occur with  improper specimen collection / handling, submission of specimen other  than nasopharyngeal swab, presence of viral mutation(s) within the  areas targeted by this assay, and inadequate number of viral copies  (<250 copies / mL). A negative result must be combined with clinical  observations, patient history, and epidemiological information. If result is POSITIVE SARS-CoV-2 target nucleic acids are DET ECTED. The SARS-CoV-2 RNA is generally detectable in upper and lower  respiratory specimens during the acute phase of infection.  Positive  results are indicative of active infection with SARS-CoV-2.  Clinical  correlation with patient history and other diagnostic information is  necessary to determine patient infection status.  Positive results do  not rule out bacterial infection or co-infection with other viruses. If result is  PRESUMPTIVE POSTIVE SARS-CoV-2 nucleic acids MAY BE PRESENT.   A presumptive positive result was obtained on the submitted specimen  and confirmed on repeat testing.  While 2019 novel coronavirus  (SARS-CoV-2) nucleic acids may be present in the submitted sample  additional confirmatory testing may be necessary for epidemiological  and / or clinical management purposes  to differentiate between  SARS-CoV-2 and other Sarbecovirus currently known to infect humans.  If clinically indicated additional testing with an alternate test  methodology ([phone]) is advised. The SARS-CoV-2 RNA is generally  detectable in upper and lower respiratory specimens during the acute  phase of infection. The expected result is Negative. Fact Sheet for Patients:  BoilerBrush.com.cyhttps://www.fda.gov/media/136312/download Fact Sheet for Healthcare Providers: https://pope.com/https://www.fda.gov/media/136313/download This test is not yet approved or cleared by the [place] FDA and has been authorized for detection and/or diagnosis of SARS-CoV-2 by FDA under an Emergency Use Authorization (EUA).  This EUA will remain in effect (meaning this test  can be used) for the duration of the COVID-19 declaration under Section 564(b)(1) of the Act, 21 U.S.C. section 360bbb-3(b)(1), unless the authorization is terminated or revoked sooner. Performed at [facility], [address] [place], [place] [address]   Blood Culture (routine x 2)     Status: Abnormal   Collection Time: 01/15/19  3:00 PM   Specimen: BLOOD  Result Value Ref Range Status   Specimen Description BLOOD RIGHT ANTECUBITAL  Final   Special Requests   Final    BOTTLES DRAWN AEROBIC AND ANAEROBIC Blood Culture adequate volume   Culture  Setup Time   Final    ANAEROBIC BOTTLE ONLY GRAM POSITIVE COCCI IN CLUSTERS CRITICAL RESULT CALLED TO, READ BACK BY AND VERIFIED WITH: Lois Huxley PharmD 15:10 01/16/19 ([email or web address])    Culture (A)  Final    STAPHYLOCOCCUS SPECIES (COAGULASE  NEGATIVE) THE SIGNIFICANCE OF ISOLATING THIS ORGANISM FROM A SINGLE SET OF BLOOD CULTURES WHEN MULTIPLE SETS ARE DRAWN IS UNCERTAIN. PLEASE NOTIFY THE MICROBIOLOGY DEPARTMENT WITHIN ONE WEEK IF SPECIATION AND SENSITIVITIES ARE REQUIRED. Performed at [facility], [address] [place], [place] [address]    Report Status 01/17/2019 FINAL  Final  Blood Culture ID Panel (Reflexed)     Status: Abnormal   Collection Time: 01/15/19  3:00 PM  Result Value Ref Range Status   Enterococcus species NOT DETECTED NOT DETECTED Final   Listeria monocytogenes NOT DETECTED NOT DETECTED Final   Staphylococcus species DETECTED (A) NOT DETECTED Final    Comment: Methicillin (oxacillin) susceptible coagulase negative staphylococcus. Possible blood culture contaminant (unless isolated from more than one blood culture draw or clinical case suggests pathogenicity). No antibiotic treatment is indicated for blood  culture contaminants. CRITICAL RESULT CALLED TO, READ BACK BY AND VERIFIED WITH: Lois Huxley PharmD 15:10 01/16/19 ([email or web address])    Staphylococcus aureus (BCID) NOT DETECTED NOT DETECTED Final   Methicillin resistance NOT DETECTED NOT DETECTED Final   Streptococcus species NOT DETECTED NOT DETECTED Final   Streptococcus agalactiae NOT DETECTED NOT DETECTED Final   Streptococcus pneumoniae NOT DETECTED NOT DETECTED Final   Streptococcus pyogenes NOT DETECTED NOT DETECTED Final   Acinetobacter baumannii NOT DETECTED NOT DETECTED Final   Enterobacteriaceae species NOT DETECTED NOT DETECTED Final   Enterobacter cloacae complex NOT DETECTED NOT DETECTED Final   Escherichia coli NOT DETECTED NOT DETECTED Final   Klebsiella oxytoca NOT DETECTED NOT DETECTED Final   Klebsiella pneumoniae NOT DETECTED NOT DETECTED Final   Proteus species NOT DETECTED NOT DETECTED Final   Serratia marcescens NOT DETECTED NOT DETECTED Final   Haemophilus influenzae NOT DETECTED NOT DETECTED Final   Neisseria meningitidis NOT  DETECTED NOT DETECTED Final   Pseudomonas aeruginosa NOT DETECTED NOT DETECTED Final   Candida albicans NOT DETECTED NOT DETECTED Final   Candida glabrata NOT DETECTED NOT DETECTED Final   Candida krusei NOT DETECTED NOT DETECTED Final   Candida parapsilosis NOT DETECTED NOT DETECTED Final   Candida tropicalis NOT DETECTED NOT DETECTED Final    Comment: Performed at [facility], [address], [place], [place] [address]  Blood Culture (routine x 2)     Status: None (Preliminary result)   Collection Time: 01/15/19  7:50 PM   Specimen: BLOOD RIGHT HAND  Result Value Ref Range Status   Specimen Description BLOOD RIGHT HAND  Final   Special Requests   Final    BOTTLES DRAWN AEROBIC AND ANAEROBIC Blood Culture results may not be optimal due to  an inadequate volume of blood received in culture bottles   Culture   Final    NO GROWTH 2 DAYS Performed at [facility], [address] [place], [place] [address]    Report Status PENDING  Incomplete    Radiology Reports Ct Angio Chest Pe W And/or Wo Contrast  Result Date: 01/15/2019 CLINICAL DATA:  EMS call for CP and SHOB x 1 week . Pt reported Pain increased with movement. Pt has Dx of A-fib and a ablation the patient . Pt given 324 ASA and one NGT SL . BP 120/70 prior to meds and BP 74/40 after meds. On arrival EMS reported BP 120/70. Pt reports relief of CP From meds. HR 80, CBG 137, 97.2 temp. EXAM: CT ANGIOGRAPHY CHEST CT ABDOMEN AND PELVIS WITH CONTRAST TECHNIQUE: Multidetector CT imaging of the chest was performed using the standard protocol during bolus administration of intravenous contrast. Multiplanar CT image reconstructions and MIPs were obtained to evaluate the vascular anatomy. Multidetector CT imaging of the abdomen and pelvis was performed using the standard protocol during bolus administration of intravenous contrast. CONTRAST:  OMNIPAQUE IOHEXOL 350 MG/ML SOLN COMPARISON:  None. FINDINGS: CTA CHEST FINDINGS  Cardiovascular: There is satisfactory opacification of the pulmonary arteries to the segmental level. There is no evidence of a pulmonary embolism. Heart is normal in size. No pericardial effusion. Minimal three-vessel coronary artery calcifications. Great vessels are normal in caliber. Mild aortic atherosclerosis. No dissection. Mediastinum/Nodes: Large hiatal hernia, lying to the right of the lower thoracic spine. Esophagus unremarkable. Trachea widely patent. Normal thyroid. No neck base, axillary, mediastinal or hilar masses or enlarged lymph nodes. Lungs/Pleura: Bilateral, peripheral ground-glass airspace opacities. Slight upper lobe predominance. No lung masses or suspicious nodules. No interstitial thickening. No pleural effusion or pneumothorax. Musculoskeletal: No fracture or acute finding. No osteoblastic or osteolytic lesions. Review of the MIP images confirms the above findings. CT ABDOMEN and PELVIS FINDINGS Hepatobiliary: Decreased attenuation of the liver consistent with fatty infiltration. No liver mass or focal lesion. Liver normal in size. Normal gallbladder. No bile duct dilation. Pancreas: Unremarkable. No pancreatic ductal dilatation or surrounding inflammatory changes. Spleen: Normal in size without focal abnormality. Adrenals/Urinary Tract: Adrenal glands are unremarkable. Kidneys are normal, without renal calculi, focal lesion, or hydronephrosis. Bladder is unremarkable. Stomach/Bowel: Stomach unremarkable other than the large hiatal hernia. Small bowel and colon are normal in caliber. No wall thickening. No inflammation. There are colonic diverticula mostly along the left colon. No diverticulitis. Normal appendix visualized. Vascular/Lymphatic: Mild aortic atherosclerosis.  No aneurysm. No enlarged lymph nodes. Reproductive: Small partly calcified uterine fibroids. Uterus normal in overall size. No ovarian/adnexal masses. Other: No abdominal wall hernia or abnormality. No abdominopelvic  ascites. Musculoskeletal: No fracture or acute finding. No osteoblastic or osteolytic lesions. Review of the MIP images confirms the above findings. IMPRESSION: CTA CHEST 1. No evidence of a pulmonary embolism. 2. Bilateral peripheral ground-glass airspace lung opacities with a slight upper lobe predominance. Findings consistent with multifocal infection. Consider atypical infection including viral infection. Noninfectious lung inflammation may also have this appearance. 3. No other acute findings in the chest. 4. Large hiatal hernia. CT ABDOMEN AND PELVIS 1. No acute findings within the abdomen or pelvis. 2. Hepatic steatosis. 3. Aortic atherosclerosis. 4. Scattered colonic diverticula without diverticulitis. Electronically Signed   By: Amie Portland M.D.   On: 01/15/2019 12:01   Ct Abdomen Pelvis W Contrast  Result Date: 01/15/2019 CLINICAL DATA:  EMS call for CP and SHOB x  1 week . Pt reported Pain increased with movement. Pt has Dx of A-fib and a ablation the patient . Pt given 324 ASA and one NGT SL . BP 120/70 prior to meds and BP 74/40 after meds. On arrival EMS reported BP 120/70. Pt reports relief of CP From meds. HR 80, CBG 137, 97.2 temp. EXAM: CT ANGIOGRAPHY CHEST CT ABDOMEN AND PELVIS WITH CONTRAST TECHNIQUE: Multidetector CT imaging of the chest was performed using the standard protocol during bolus administration of intravenous contrast. Multiplanar CT image reconstructions and MIPs were obtained to evaluate the vascular anatomy. Multidetector CT imaging of the abdomen and pelvis was performed using the standard protocol during bolus administration of intravenous contrast. CONTRAST:  OMNIPAQUE IOHEXOL 350 MG/ML SOLN COMPARISON:  None. FINDINGS: CTA CHEST FINDINGS Cardiovascular: There is satisfactory opacification of the pulmonary arteries to the segmental level. There is no evidence of a pulmonary embolism. Heart is normal in size. No pericardial effusion. Minimal three-vessel coronary  artery calcifications. Great vessels are normal in caliber. Mild aortic atherosclerosis. No dissection. Mediastinum/Nodes: Large hiatal hernia, lying to the right of the lower thoracic spine. Esophagus unremarkable. Trachea widely patent. Normal thyroid. No neck base, axillary, mediastinal or hilar masses or enlarged lymph nodes. Lungs/Pleura: Bilateral, peripheral ground-glass airspace opacities. Slight upper lobe predominance. No lung masses or suspicious nodules. No interstitial thickening. No pleural effusion or pneumothorax. Musculoskeletal: No fracture or acute finding. No osteoblastic or osteolytic lesions. Review of the MIP images confirms the above findings. CT ABDOMEN and PELVIS FINDINGS Hepatobiliary: Decreased attenuation of the liver consistent with fatty infiltration. No liver mass or focal lesion. Liver normal in size. Normal gallbladder. No bile duct dilation. Pancreas: Unremarkable. No pancreatic ductal dilatation or surrounding inflammatory changes. Spleen: Normal in size without focal abnormality. Adrenals/Urinary Tract: Adrenal glands are unremarkable. Kidneys are normal, without renal calculi, focal lesion, or hydronephrosis. Bladder is unremarkable. Stomach/Bowel: Stomach unremarkable other than the large hiatal hernia. Small bowel and colon are normal in caliber. No wall thickening. No inflammation. There are colonic diverticula mostly along the left colon. No diverticulitis. Normal appendix visualized. Vascular/Lymphatic: Mild aortic atherosclerosis.  No aneurysm. No enlarged lymph nodes. Reproductive: Small partly calcified uterine fibroids. Uterus normal in overall size. No ovarian/adnexal masses. Other: No abdominal wall hernia or abnormality. No abdominopelvic ascites. Musculoskeletal: No fracture or acute finding. No osteoblastic or osteolytic lesions. Review of the MIP images confirms the above findings. IMPRESSION: CTA CHEST 1. No evidence of a pulmonary embolism. 2. Bilateral  peripheral ground-glass airspace lung opacities with a slight upper lobe predominance. Findings consistent with multifocal infection. Consider atypical infection including viral infection. Noninfectious lung inflammation may also have this appearance. 3. No other acute findings in the chest. 4. Large hiatal hernia. CT ABDOMEN AND PELVIS 1. No acute findings within the abdomen or pelvis. 2. Hepatic steatosis. 3. Aortic atherosclerosis. 4. Scattered colonic diverticula without diverticulitis. Electronically Signed   By: Amie Portland M.D.   On: 01/15/2019 12:01   Dg Chest Port 1 View  Result Date: 01/15/2019 CLINICAL DATA:  Fall 7/25, chest pain/SOB since then. Hx of stroke, lupus, hypertension, diabetes, coronary artery disease, asthma, afib. Nonsmoker. EXAM: PORTABLE CHEST 1 VIEW COMPARISON:  None. FINDINGS: Cardiac silhouette is mildly enlarged. No mediastinal or hilar masses. Subtle hazy airspace opacities are noted in the peripheral left mid and lower lung, possibly in the peripheral right mid lung. There are mildly prominent bronchovascular markings diffusely. No convincing pleural effusion.  No pneumothorax. Skeletal structures are grossly intact.  IMPRESSION: 1. Subtle hazy airspace opacities in the left mid and lower lung and possibly right peripheral mid lung. Consider multifocal pneumonia if there are consistent clinical findings. Electronically Signed   By: Amie Portlandavid  Ormond M.D.   On: 01/15/2019 10:26   Dg Chest Port 1v Same Day  Result Date: 01/18/2019 CLINICAL DATA:  Shortness of breath EXAM: PORTABLE CHEST 1 VIEW COMPARISON:  January 15, 2019 FINDINGS: The study is extremely limited due to positioning. Patchy airspace opacities seen throughout both lungs, left greater than right. The cardiomediastinal silhouette is obscured. IMPRESSION: Extremely limited exam due to patient positioning. Airspace opacities throughout both lungs. Electronically Signed   By: Jonna ClarkBindu  Avutu M.D.   On: 01/18/2019 11:10

## 2019-01-18 NOTE — Progress Notes (Signed)
Pt. Transferred from bedside commode to bed; Desated to 86% on 10L HFNC and then to 77%; Pt. Oxygen increased to 15L HFNC, still sating around 84%; placed on non rebreather along with HFNC at 15L; PRN inhaler given; pt. Placed in prone position; RT and MD notified; RT in to assess pt.; Pt. Now at 93% on 15 L HFNC in prone position. No s/s of distress at this time; Pt. Brother updated on pt. Condition.

## 2019-01-18 NOTE — Progress Notes (Addendum)
Not only maintaining NSR after amiodarone (NSR since 530 am), but now having episodes of S. Stacey Burns down into the 30s-40s, asymptomatic and goes back up into the 50s, but will go ahead and stop the metoprolol.

## 2019-01-18 NOTE — Progress Notes (Signed)
Left upper arm midline ( 20gx8cm) placed in the cephalic vein without difficulty.

## 2019-01-18 NOTE — Progress Notes (Signed)
  Amiodarone Drug - Drug Interaction Consult Note  Recommendations: Continue to monitor Amiodarone is metabolized by the cytochrome P450 system and therefore has the potential to cause many drug interactions. Amiodarone has an average plasma half-life of 50 days (range 20 to 100 days).   There is potential for drug interactions to occur several weeks or months after stopping treatment and the onset of drug interactions may be slow after initiating amiodarone.    [x]  Beta blockers: increased risk of bradycardia, AV block and myocardial depression. Sotalol - avoid concomitant use.  [x]  Diuretics: increased risk of cardiotoxicity if hypokalemia occurs.     Thank You,  Sherlon Handing, PharmD, BCPS [facility] Clinical pharmacist phone [phone] 01/18/2019 12:36 PM

## 2019-01-19 LAB — GLUCOSE, CAPILLARY
Glucose-Capillary: 258 mg/dL — ABNORMAL HIGH (ref 70–99)
Glucose-Capillary: 313 mg/dL — ABNORMAL HIGH (ref 70–99)
Glucose-Capillary: 288 mg/dL — ABNORMAL HIGH (ref 70–99)
Glucose-Capillary: 206 mg/dL — ABNORMAL HIGH (ref 70–99)

## 2019-01-19 LAB — C-REACTIVE PROTEIN: CRP: 10.3 mg/dL — ABNORMAL HIGH (ref <1.0)

## 2019-01-19 LAB — CBC WITH DIFFERENTIAL/PLATELET
Abs Immature Granulocytes: 0.03 10*3/uL (ref 0.00–0.07)
Basophils Absolute: 0 10*3/uL (ref 0.0–0.1)
Basophils Relative: 0 %
Eosinophils Absolute: 0 10*3/uL (ref 0.0–0.5)
Eosinophils Relative: 0 %
HCT: 41.6 % (ref 36.0–46.0)
Hemoglobin: 13.7 g/dL (ref 12.0–15.0)
Immature Granulocytes: 0 %
Lymphocytes Relative: 6 %
Lymphs Abs: 0.5 10*3/uL — ABNORMAL LOW (ref 0.7–4.0)
MCH: 28.5 pg (ref 26.0–34.0)
MCHC: 32.9 g/dL (ref 30.0–36.0)
MCV: 86.7 fL (ref 80.0–100.0)
Monocytes Absolute: 0.3 10*3/uL (ref 0.1–1.0)
Monocytes Relative: 3 %
Neutro Abs: 7.2 10*3/uL (ref 1.7–7.7)
Neutrophils Relative %: 91 %
Platelets: 326 10*3/uL (ref 150–400)
RBC: 4.8 MIL/uL (ref 3.87–5.11)
RDW: 17 % — ABNORMAL HIGH (ref 11.5–15.5)
WBC: 8 10*3/uL (ref 4.0–10.5)
nRBC: 0 % (ref 0.0–0.2)

## 2019-01-19 LAB — COMPREHENSIVE METABOLIC PANEL
ALT: 22 U/L (ref 0–44)
AST: 36 U/L (ref 15–41)
Albumin: 2.6 g/dL — ABNORMAL LOW (ref 3.5–5.0)
Alkaline Phosphatase: 50 U/L (ref 38–126)
Anion gap: 11 (ref 5–15)
BUN: 23 mg/dL (ref 8–23)
CO2: 28 mmol/L (ref 22–32)
Calcium: 8.3 mg/dL — ABNORMAL LOW (ref 8.9–10.3)
Chloride: 100 mmol/L (ref 98–111)
Creatinine, Ser: 0.79 mg/dL (ref 0.44–1.00)
GFR calc Af Amer: >60 mL/min (ref >60)
GFR calc non Af Amer: >60 mL/min (ref >60)
Glucose, Bld: 218 mg/dL — ABNORMAL HIGH (ref 70–99)
Potassium: 4.3 mmol/L (ref 3.5–5.1)
Sodium: 139 mmol/L (ref 135–145)
Total Bilirubin: 0.5 mg/dL (ref 0.3–1.2)
Total Protein: 7.5 g/dL (ref 6.5–8.1)

## 2019-01-19 LAB — D-DIMER, QUANTITATIVE: D-Dimer, Quant: 0.78 ug/mL-FEU — ABNORMAL HIGH (ref 0.00–0.50)

## 2019-01-19 LAB — BRAIN NATRIURETIC PEPTIDE: B Natriuretic Peptide: 127.1 pg/mL — ABNORMAL HIGH (ref 0.0–100.0)

## 2019-01-19 LAB — FERRITIN: Ferritin: 188 ng/mL (ref 11–307)

## 2019-01-19 MED ORDER — FUROSEMIDE 10 MG/ML IJ SOLN
40.0000 mg | Freq: Every day | INTRAMUSCULAR | Status: DC
  Administered 2019-01-19 – 2019-01-23 (×5): 40 mg via INTRAVENOUS
  Filled 2019-01-19 (×5): qty 4

## 2019-01-19 MED ORDER — METOPROLOL TARTRATE 25 MG PO TABS
12.5000 mg | ORAL_TABLET | Freq: Two times a day (BID) | ORAL | Status: DC
  Administered 2019-01-19: 12.5 mg via ORAL
  Filled 2019-01-19 (×2): qty 1

## 2019-01-19 MED ORDER — METOPROLOL TARTRATE 5 MG/5ML IV SOLN
5.0000 mg | Freq: Once | INTRAVENOUS | Status: AC
  Administered 2019-01-19: 5 mg via INTRAVENOUS
  Filled 2019-01-19: qty 5

## 2019-01-19 MED ORDER — INSULIN GLARGINE 100 UNIT/ML ~~LOC~~ SOLN
20.0000 [IU] | Freq: Every day | SUBCUTANEOUS | Status: DC
  Administered 2019-01-20 – 2019-01-21 (×2): 20 [IU] via SUBCUTANEOUS
  Filled 2019-01-19 (×2): qty 0.2

## 2019-01-19 MED ORDER — TOCILIZUMAB 400 MG/20ML IV SOLN
800.0000 mg | Freq: Once | INTRAVENOUS | Status: AC
  Administered 2019-01-19: 800 mg via INTRAVENOUS
  Filled 2019-01-19: qty 40

## 2019-01-19 NOTE — Progress Notes (Addendum)
PROGRESS NOTE                                                                                                                                                                                                             Patient Demographics:    Stacey Burns, is a 63 y.o. female, DOB - Oct 08, 1955, RUE:[id number]  Outpatient Primary MD for the patient is Tollie EthChurch, Shea N, PA-C   Admit date - 01/15/2019   LOS - 3  Chief Complaint  Patient presents with   Chest Pain       Brief Narrative: Patient is a 63 y.o. female with PMHx of lupus on Imuran, DM, HTN, A. fib on Eliquis who presented with 1 week history of abdominal cramps, chest discomfort and progressive shortness of breath-she was found to have acute hypoxic respiratory failure in the setting of COVID-19 pneumonia.  Hospital course complicated by worsening hypoxemia requiring 15 L high flow oxygen.   Subjective:    Stacey Burns today remains on 15 L of oxygen-gets short of breath and hypoxic with minimal exertion.  Has had episodes of A. fib with RVR and bradycardia overnight.   Assessment  & Plan :   Acute Hypoxic Resp Failure due to Covid 19 Viral pneumonia: Remains essentially unchanged-on 15 L of high flow oxygen-with O2 sats remaining in high 80s/low 90s range.  With minimal movement/ambulation she desaturates to the low 80s.  She however is not in any respiratory distress-and is not using any accessory muscles.  She remains on IV Solu-Medrol and Remdesivir.  After extensive discussion regarding rationale, risks, benefits of Actemra with  patient and her brother over the phone (she defers decision to her brother)-both patient and her brother have consented to the off label use of Actemra (on Imuran as outpatient).  Her inflammatory markers are improving but still remain elevated-her IL-6 levels on 8/4 were significantly elevated.  We will continue to monitor  closely-currently she seems stable on high flow oxygen-but if any further signs of instability develop-we will transfer to the ICU.  COVID-19 Labs: Recent Labs    01/17/19 0225 01/18/19 0155 01/19/19 0215  DDIMER 0.56*  --  0.78*  FERRITIN 165  --  188  CRP 26.6* 18.7* 10.3*    COVID-19 Medications: Steroids: Decadron 8/3-8/5, Solu-Medrol 8/5>> Remdesivir: 8/3>> Actemra: 8/6x1 Convalescent Plasma:  N/A Research Studies: N/A  The rationale for the off label use of Actemra its known side effects, potential benefits was  discussed with patient at bedside & her brother over the phone.The use of Actemra is based on published clinical articles/anecdotal data as clinical trials are still ongoing.  Complete risks and long-term side effects are unknown, however given the very tenuous clinical state of the patient, it is felt that potential benefits outweigh the risk. The patient defers decision to her brother, as he has a medical background (has a PHD-is a Management consultant in [place]). After extensive discussions over the phone on 8/5 and 8/6-patient's brother consented to the use of Actemra. (He had infact, contacted the bedside RN earlier this morning with request to give Actemra)  A. fib/atrial fibrillation with RVR with spells of bradycardia: Continues to have very brief spells of A. fib with RVR and bradycardia overnight-just loaded with amiodarone yesterday after discussion with cardiology.  Spoke with Dr. Eldridge Dace again today-recommendations were to continue with amiodarone-and to slowly taper off beta-blockers-which we will start from 8/7.  Continue Eliquis.  Recent 2D echocardiogram showed preserved EF (reviewed report in care everywhere)  Chronic diastolic heart failure: Although no signs of volume overload-we will continue with IV Lasix-in an attempt to keep patient in negative balance.  Follow electrolytes, weights, intake and output.  HTN: Controlled-continue  metoprolol  Insulin-dependent DM-2: CBG continue to be on the higher side-we will increase Lantus to 20 units, continue 4 units of pre-meal NovoLog-continue SSI.  Follow.   Lupus: Appears to be stable-apparently was diagnosed with pancolitis in 2015-continue to hold Imuran-see above regarding discussion about Actemra.  COPD/asthma: No wheezing-appears stable-continue as needed bronchodilators      OSA: Not on CPAP due to COVID-on high flow oxygen  Coagulase-negative Staphylococcus bacteremia times 1 out of 2 cultures: Likely contaminant-no indication of infection  Obesity: Estimated body mass index is 46.2 kg/m as calculated from the following:   Height as of this encounter: 5\' 4"  (1.626 m).   Weight as of this encounter: 122.1 kg.   ABG:    Component Value Date/Time   PHART 7.498 (H) 01/18/2019 1018   PCO2ART 33.5 01/18/2019 1018   PO2ART 46.0 (L) 01/18/2019 1018   HCO3 26.0 01/18/2019 1018   TCO2 27 01/18/2019 1018   O2SAT 86.0 01/18/2019 1018      Condition - Extremely Guarded-very tenuous with risk for further deterioration  Family Communication  : Brother updated over the phone on 8/6  Code Status :  Full Code  Diet :  Diet Order            Diet Carb Modified Fluid consistency: Thin; Room service appropriate? Yes  Diet effective now               Disposition Plan  :  Remain hospitalized-not stable for discharge  Consults  :  None  Procedures  :  None  GI prophylaxis: PPI  DVT Prophylaxis  : Eliquis  Lab Results  Component Value Date   PLT 326 01/19/2019    Antibiotics  :    Anti-infectives (From admission, onward)   Start     Dose/Rate Route Frequency Ordered Stop   01/17/19 1700  remdesivir 100 mg in sodium chloride 0.9 % 250 mL IVPB     100 mg 500 mL/hr over 30 Minutes Intravenous Every 24 hours 01/16/19 1617 01/21/19 1659   01/16/19 1700  remdesivir 200 mg in sodium chloride 0.9 % 250 mL IVPB  200 mg 500 mL/hr over 30 Minutes  Intravenous Once 01/16/19 1617 01/16/19 1824      Inpatient Medications  Scheduled Meds:  amiodarone  200 mg Oral BID   apixaban  5 mg Oral BID   baclofen  10 mg Oral QHS   furosemide  40 mg Intravenous Daily   insulin aspart  0-15 Units Subcutaneous TID WC   insulin aspart  0-5 Units Subcutaneous QHS   insulin aspart  4 Units Subcutaneous TID WC   insulin glargine  15 Units Subcutaneous Daily   methylPREDNISolone (SOLU-MEDROL) injection  40 mg Intravenous Q8H   metoprolol tartrate  12.5 mg Oral BID   pantoprazole  40 mg Oral Daily   sodium chloride flush  10-40 mL Intracatheter Q12H   sodium chloride flush  3 mL Intravenous Q12H   umeclidinium bromide  1 puff Inhalation Daily   Continuous Infusions:  remdesivir 100 mg in NS 250 mL 100 mg (01/18/19 1702)   PRN Meds:.acetaminophen, ALPRAZolam, chlorpheniramine-HYDROcodone, diphenhydrAMINE, guaiFENesin-dextromethorphan, levalbuterol, lip balm, ondansetron **OR** ondansetron (ZOFRAN) IV, oxyCODONE, sodium chloride flush, zolpidem   Time Spent in minutes  45  See all Orders from today for further details   Jeoffrey Massed M.D on 01/19/2019 at 11:39 AM  To page go to [email or web address] - use universal password  [facility] -  Office  [phone]    Objective:   Vitals:   01/18/19 1638 01/19/19 0333 01/19/19 0600 01/19/19 0815  BP: 95/63 (!) 119/93  108/90  Pulse: 71   66  Resp: (!) 27   16  Temp: 97.9 F (36.6 C)   98.2 F (36.8 C)  TempSrc: Oral   Axillary  SpO2: 90%   (!) 87%  Weight:   122.1 kg   Height:        Wt Readings from Last 3 Encounters:  01/19/19 122.1 kg  07/27/18 119.3 kg     Intake/Output Summary (Last 24 hours) at 01/19/2019 1139 Last data filed at 01/19/2019 0933 Gross per 24 hour  Intake 1798.16 ml  Output 250 ml  Net 1548.16 ml     Physical Exam Physical Exam Gen Exam:Alert awake-not in any distress HEENT:atraumatic, normocephalic Chest: Rales bilaterally CVS:S1S2  regular Abdomen:soft non tender, non distended Extremities:no edema Neurology: Non focal Skin: no rash   Data Review:    CBC Recent Labs  Lab 01/15/19 0910 01/17/19 0225 01/18/19 0155 01/18/19 1018 01/19/19 0215  WBC 5.7 8.4 8.7  --  8.0  HGB 14.1 12.6 12.8 13.3 13.7  HCT 43.9 38.7 39.0 39.0 41.6  PLT 199 215 274  --  326  MCV 90.9 88.8 87.6  --  86.7  MCH 29.2 28.9 28.8  --  28.5  MCHC 32.1 32.6 32.8  --  32.9  RDW 16.7* 17.2* 17.2*  --  17.0*  LYMPHSABS 0.6* 0.3* 0.6*  --  0.5*  MONOABS 0.5 0.3 0.4  --  0.3  EOSABS 0.0 0.0 0.0  --  0.0  BASOSABS 0.0 0.0 0.0  --  0.0    Chemistries  Recent Labs  Lab 01/15/19 0910 01/17/19 0225 01/18/19 0155 01/18/19 1018 01/19/19 0215  NA 137 138 138 139 139  K 3.7 4.7 4.7 4.2 4.3  CL 104 103 103  --  100  CO2 24 27 30   --  28  GLUCOSE 108* 196* 216*  --  218*  BUN 10 12 20   --  23  CREATININE 0.89 0.75 0.70  --  0.79  CALCIUM 8.1* 8.3*  8.2*  --  8.3*  MG  --  2.1 2.2  --   --   AST 40 34 25  --  36  ALT 21 17 18   --  22  ALKPHOS 47 38 41  --  50  BILITOT 0.9 0.3 0.4  --  0.5   ------------------------------------------------------------------------------------------------------------------ Recent Labs    01/17/19 0225  TRIG 70    Lab Results  Component Value Date   HGBA1C 7.1 (H) 01/15/2019   ------------------------------------------------------------------------------------------------------------------ No results for input(s): TSH, T4TOTAL, T3FREE, THYROIDAB in the last 72 hours.  Invalid input(s): FREET3 ------------------------------------------------------------------------------------------------------------------ Recent Labs    01/17/19 0225 01/19/19 0215  FERRITIN 165 188    Coagulation profile No results for input(s): INR, PROTIME in the last 168 hours.  Recent Labs    01/17/19 0225 01/19/19 0215  DDIMER 0.56* 0.78*    Cardiac Enzymes No results for input(s): CKMB, TROPONINI, MYOGLOBIN  in the last 168 hours.  Invalid input(s): CK ------------------------------------------------------------------------------------------------------------------    Component Value Date/Time   BNP 127.1 (H) 01/19/2019 0215    Micro Results Recent Results (from the past 240 hour(s))  SARS Coronavirus 2 [facility] order, Performed in [facility] hospital lab) Nasopharyngeal Nasopharyngeal Swab     Status: Abnormal   Collection Time: 01/15/19 10:35 AM   Specimen: Nasopharyngeal Swab  Result Value Ref Range Status   SARS Coronavirus 2 POSITIVE (A) NEGATIVE Final    Comment: CRITICAL RESULT CALLED TO, READ BACK BY AND VERIFIED WITH: RN ELIZABETH TAYLOR [phone] FCP (NOTE) If result is NEGATIVE SARS-CoV-2 target nucleic acids are NOT DETECTED. The SARS-CoV-2 RNA is generally detectable in upper and lower  respiratory specimens during the acute phase of infection. The lowest  concentration of SARS-CoV-2 viral copies this assay can detect is 250  copies / mL. A negative result does not preclude SARS-CoV-2 infection  and should not be used as the sole basis for treatment or other  patient management decisions.  A negative result may occur with  improper specimen collection / handling, submission of specimen other  than nasopharyngeal swab, presence of viral mutation(s) within the  areas targeted by this assay, and inadequate number of viral copies  (<250 copies / mL). A negative result must be combined with clinical  observations, patient history, and epidemiological information. If result is POSITIVE SARS-CoV-2 target nucleic acids are DET ECTED. The SARS-CoV-2 RNA is generally detectable in upper and lower  respiratory specimens during the acute phase of infection.  Positive  results are indicative of active infection with SARS-CoV-2.  Clinical  correlation with patient history and other diagnostic information is  necessary to determine patient infection status.  Positive results do   not rule out bacterial infection or co-infection with other viruses. If result is PRESUMPTIVE POSTIVE SARS-CoV-2 nucleic acids MAY BE PRESENT.   A presumptive positive result was obtained on the submitted specimen  and confirmed on repeat testing.  While 2019 novel coronavirus  (SARS-CoV-2) nucleic acids may be present in the submitted sample  additional confirmatory testing may be necessary for epidemiological  and / or clinical management purposes  to differentiate between  SARS-CoV-2 and other Sarbecovirus currently known to infect humans.  If clinically indicated additional testing with an alternate test  methodology (LA [phone]) is advised. The SARS-CoV-2 RNA is generally  detectable in upper and lower respiratory specimens during the acute  phase of infection. The expected result is Negative. Fact Sheet for Patients:  BoilerBrush.com.cy Fact Sheet for Healthcare Providers: https://pope.com/  This test is not yet approved or cleared by the [place] FDA and has been authorized for detection and/or diagnosis of SARS-CoV-2 by FDA under an Emergency Use Authorization (EUA).  This EUA will remain in effect (meaning this test can be used) for the duration of the COVID-19 declaration under Section 564(b)(1) of the Act, 21 U.S.C. section 360bbb-3(b)(1), unless the authorization is terminated or revoked sooner. Performed at [facility], [address] [place], [place] [address]   Blood Culture (routine x 2)     Status: Abnormal   Collection Time: 01/15/19  3:00 PM   Specimen: BLOOD  Result Value Ref Range Status   Specimen Description BLOOD RIGHT ANTECUBITAL  Final   Special Requests   Final    BOTTLES DRAWN AEROBIC AND ANAEROBIC Blood Culture adequate volume   Culture  Setup Time   Final    ANAEROBIC BOTTLE ONLY GRAM POSITIVE COCCI IN CLUSTERS CRITICAL RESULT CALLED TO, READ BACK BY AND VERIFIED WITH: Lois HuxleyK. Patton PharmD 15:10  01/16/19 ([email or web address])    Culture (A)  Final    STAPHYLOCOCCUS SPECIES (COAGULASE NEGATIVE) THE SIGNIFICANCE OF ISOLATING THIS ORGANISM FROM A SINGLE SET OF BLOOD CULTURES WHEN MULTIPLE SETS ARE DRAWN IS UNCERTAIN. PLEASE NOTIFY THE MICROBIOLOGY DEPARTMENT WITHIN ONE WEEK IF SPECIATION AND SENSITIVITIES ARE REQUIRED. Performed at [facility], [address] [place], [place] [address]    Report Status 01/17/2019 FINAL  Final  Blood Culture ID Panel (Reflexed)     Status: Abnormal   Collection Time: 01/15/19  3:00 PM  Result Value Ref Range Status   Enterococcus species NOT DETECTED NOT DETECTED Final   Listeria monocytogenes NOT DETECTED NOT DETECTED Final   Staphylococcus species DETECTED (A) NOT DETECTED Final    Comment: Methicillin (oxacillin) susceptible coagulase negative staphylococcus. Possible blood culture contaminant (unless isolated from more than one blood culture draw or clinical case suggests pathogenicity). No antibiotic treatment is indicated for blood  culture contaminants. CRITICAL RESULT CALLED TO, READ BACK BY AND VERIFIED WITH: Lois HuxleyK. Patton PharmD 15:10 01/16/19 ([email or web address])    Staphylococcus aureus (BCID) NOT DETECTED NOT DETECTED Final   Methicillin resistance NOT DETECTED NOT DETECTED Final   Streptococcus species NOT DETECTED NOT DETECTED Final   Streptococcus agalactiae NOT DETECTED NOT DETECTED Final   Streptococcus pneumoniae NOT DETECTED NOT DETECTED Final   Streptococcus pyogenes NOT DETECTED NOT DETECTED Final   Acinetobacter baumannii NOT DETECTED NOT DETECTED Final   Enterobacteriaceae species NOT DETECTED NOT DETECTED Final   Enterobacter cloacae complex NOT DETECTED NOT DETECTED Final   Escherichia coli NOT DETECTED NOT DETECTED Final   Klebsiella oxytoca NOT DETECTED NOT DETECTED Final   Klebsiella pneumoniae NOT DETECTED NOT DETECTED Final   Proteus species NOT DETECTED NOT DETECTED Final   Serratia marcescens NOT DETECTED NOT DETECTED Final    Haemophilus influenzae NOT DETECTED NOT DETECTED Final   Neisseria meningitidis NOT DETECTED NOT DETECTED Final   Pseudomonas aeruginosa NOT DETECTED NOT DETECTED Final   Candida albicans NOT DETECTED NOT DETECTED Final   Candida glabrata NOT DETECTED NOT DETECTED Final   Candida krusei NOT DETECTED NOT DETECTED Final   Candida parapsilosis NOT DETECTED NOT DETECTED Final   Candida tropicalis NOT DETECTED NOT DETECTED Final    Comment: Performed at [facility], [address] [place], [place] [address]  Blood Culture (routine x 2)     Status: None (Preliminary result)   Collection Time: 01/15/19  7:50 PM   Specimen: BLOOD  RIGHT HAND  Result Value Ref Range Status   Specimen Description BLOOD RIGHT HAND  Final   Special Requests   Final    BOTTLES DRAWN AEROBIC AND ANAEROBIC Blood Culture results may not be optimal due to an inadequate volume of blood received in culture bottles   Culture   Final    NO GROWTH 3 DAYS Performed at [facility], [address], [place], [place] [address]    Report Status PENDING  Incomplete    Radiology Reports Ct Angio Chest Pe W And/or Wo Contrast  Result Date: 01/15/2019 CLINICAL DATA:  EMS call for CP and SHOB x 1 week . Pt reported Pain increased with movement. Pt has Dx of A-fib and a ablation the patient . Pt given 324 ASA and one NGT SL . BP 120/70 prior to meds and BP 74/40 after meds. On arrival EMS reported BP 120/70. Pt reports relief of CP From meds. HR 80, CBG 137, 97.2 temp. EXAM: CT ANGIOGRAPHY CHEST CT ABDOMEN AND PELVIS WITH CONTRAST TECHNIQUE: Multidetector CT imaging of the chest was performed using the standard protocol during bolus administration of intravenous contrast. Multiplanar CT image reconstructions and MIPs were obtained to evaluate the vascular anatomy. Multidetector CT imaging of the abdomen and pelvis was performed using the standard protocol during bolus administration of intravenous contrast. CONTRAST:   OMNIPAQUE IOHEXOL 350 MG/ML SOLN COMPARISON:  None. FINDINGS: CTA CHEST FINDINGS Cardiovascular: There is satisfactory opacification of the pulmonary arteries to the segmental level. There is no evidence of a pulmonary embolism. Heart is normal in size. No pericardial effusion. Minimal three-vessel coronary artery calcifications. Great vessels are normal in caliber. Mild aortic atherosclerosis. No dissection. Mediastinum/Nodes: Large hiatal hernia, lying to the right of the lower thoracic spine. Esophagus unremarkable. Trachea widely patent. Normal thyroid. No neck base, axillary, mediastinal or hilar masses or enlarged lymph nodes. Lungs/Pleura: Bilateral, peripheral ground-glass airspace opacities. Slight upper lobe predominance. No lung masses or suspicious nodules. No interstitial thickening. No pleural effusion or pneumothorax. Musculoskeletal: No fracture or acute finding. No osteoblastic or osteolytic lesions. Review of the MIP images confirms the above findings. CT ABDOMEN and PELVIS FINDINGS Hepatobiliary: Decreased attenuation of the liver consistent with fatty infiltration. No liver mass or focal lesion. Liver normal in size. Normal gallbladder. No bile duct dilation. Pancreas: Unremarkable. No pancreatic ductal dilatation or surrounding inflammatory changes. Spleen: Normal in size without focal abnormality. Adrenals/Urinary Tract: Adrenal glands are unremarkable. Kidneys are normal, without renal calculi, focal lesion, or hydronephrosis. Bladder is unremarkable. Stomach/Bowel: Stomach unremarkable other than the large hiatal hernia. Small bowel and colon are normal in caliber. No wall thickening. No inflammation. There are colonic diverticula mostly along the left colon. No diverticulitis. Normal appendix visualized. Vascular/Lymphatic: Mild aortic atherosclerosis.  No aneurysm. No enlarged lymph nodes. Reproductive: Small partly calcified uterine fibroids. Uterus normal in overall size. No  ovarian/adnexal masses. Other: No abdominal wall hernia or abnormality. No abdominopelvic ascites. Musculoskeletal: No fracture or acute finding. No osteoblastic or osteolytic lesions. Review of the MIP images confirms the above findings. IMPRESSION: CTA CHEST 1. No evidence of a pulmonary embolism. 2. Bilateral peripheral ground-glass airspace lung opacities with a slight upper lobe predominance. Findings consistent with multifocal infection. Consider atypical infection including viral infection. Noninfectious lung inflammation may also have this appearance. 3. No other acute findings in the chest. 4. Large hiatal hernia. CT ABDOMEN AND PELVIS 1. No acute findings within the abdomen or pelvis. 2. Hepatic steatosis. 3. Aortic atherosclerosis.  4. Scattered colonic diverticula without diverticulitis. Electronically Signed   By: Amie Portlandavid  Ormond M.D.   On: 01/15/2019 12:01   Ct Abdomen Pelvis W Contrast  Result Date: 01/15/2019 CLINICAL DATA:  EMS call for CP and SHOB x 1 week . Pt reported Pain increased with movement. Pt has Dx of A-fib and a ablation the patient . Pt given 324 ASA and one NGT SL . BP 120/70 prior to meds and BP 74/40 after meds. On arrival EMS reported BP 120/70. Pt reports relief of CP From meds. HR 80, CBG 137, 97.2 temp. EXAM: CT ANGIOGRAPHY CHEST CT ABDOMEN AND PELVIS WITH CONTRAST TECHNIQUE: Multidetector CT imaging of the chest was performed using the standard protocol during bolus administration of intravenous contrast. Multiplanar CT image reconstructions and MIPs were obtained to evaluate the vascular anatomy. Multidetector CT imaging of the abdomen and pelvis was performed using the standard protocol during bolus administration of intravenous contrast. CONTRAST:  100mL OMNIPAQUE IOHEXOL 350 MG/ML SOLN COMPARISON:  None. FINDINGS: CTA CHEST FINDINGS Cardiovascular: There is satisfactory opacification of the pulmonary arteries to the segmental level. There is no evidence of a pulmonary  embolism. Heart is normal in size. No pericardial effusion. Minimal three-vessel coronary artery calcifications. Great vessels are normal in caliber. Mild aortic atherosclerosis. No dissection. Mediastinum/Nodes: Large hiatal hernia, lying to the right of the lower thoracic spine. Esophagus unremarkable. Trachea widely patent. Normal thyroid. No neck base, axillary, mediastinal or hilar masses or enlarged lymph nodes. Lungs/Pleura: Bilateral, peripheral ground-glass airspace opacities. Slight upper lobe predominance. No lung masses or suspicious nodules. No interstitial thickening. No pleural effusion or pneumothorax. Musculoskeletal: No fracture or acute finding. No osteoblastic or osteolytic lesions. Review of the MIP images confirms the above findings. CT ABDOMEN and PELVIS FINDINGS Hepatobiliary: Decreased attenuation of the liver consistent with fatty infiltration. No liver mass or focal lesion. Liver normal in size. Normal gallbladder. No bile duct dilation. Pancreas: Unremarkable. No pancreatic ductal dilatation or surrounding inflammatory changes. Spleen: Normal in size without focal abnormality. Adrenals/Urinary Tract: Adrenal glands are unremarkable. Kidneys are normal, without renal calculi, focal lesion, or hydronephrosis. Bladder is unremarkable. Stomach/Bowel: Stomach unremarkable other than the large hiatal hernia. Small bowel and colon are normal in caliber. No wall thickening. No inflammation. There are colonic diverticula mostly along the left colon. No diverticulitis. Normal appendix visualized. Vascular/Lymphatic: Mild aortic atherosclerosis.  No aneurysm. No enlarged lymph nodes. Reproductive: Small partly calcified uterine fibroids. Uterus normal in overall size. No ovarian/adnexal masses. Other: No abdominal wall hernia or abnormality. No abdominopelvic ascites. Musculoskeletal: No fracture or acute finding. No osteoblastic or osteolytic lesions. Review of the MIP images confirms the above  findings. IMPRESSION: CTA CHEST 1. No evidence of a pulmonary embolism. 2. Bilateral peripheral ground-glass airspace lung opacities with a slight upper lobe predominance. Findings consistent with multifocal infection. Consider atypical infection including viral infection. Noninfectious lung inflammation may also have this appearance. 3. No other acute findings in the chest. 4. Large hiatal hernia. CT ABDOMEN AND PELVIS 1. No acute findings within the abdomen or pelvis. 2. Hepatic steatosis. 3. Aortic atherosclerosis. 4. Scattered colonic diverticula without diverticulitis. Electronically Signed   By: Amie Portlandavid  Ormond M.D.   On: 01/15/2019 12:01   Dg Chest Port 1 View  Result Date: 01/15/2019 CLINICAL DATA:  Fall 7/25, chest pain/SOB since then. Hx of stroke, lupus, hypertension, diabetes, coronary artery disease, asthma, afib. Nonsmoker. EXAM: PORTABLE CHEST 1 VIEW COMPARISON:  None. FINDINGS: Cardiac silhouette is mildly enlarged. No mediastinal or hilar  masses. Subtle hazy airspace opacities are noted in the peripheral left mid and lower lung, possibly in the peripheral right mid lung. There are mildly prominent bronchovascular markings diffusely. No convincing pleural effusion.  No pneumothorax. Skeletal structures are grossly intact. IMPRESSION: 1. Subtle hazy airspace opacities in the left mid and lower lung and possibly right peripheral mid lung. Consider multifocal pneumonia if there are consistent clinical findings. Electronically Signed   By: Lajean Manes M.D.   On: 01/15/2019 10:26   Dg Chest Port 1v Same Day  Result Date: 01/18/2019 CLINICAL DATA:  Shortness of breath EXAM: PORTABLE CHEST 1 VIEW COMPARISON:  January 15, 2019 FINDINGS: The study is extremely limited due to positioning. Patchy airspace opacities seen throughout both lungs, left greater than right. The cardiomediastinal silhouette is obscured. IMPRESSION: Extremely limited exam due to patient positioning. Airspace opacities throughout  both lungs. Electronically Signed   By: Prudencio Pair M.D.   On: 01/18/2019 11:10

## 2019-01-19 NOTE — Progress Notes (Addendum)
Converted back into A.Flutter.  Will re-order the metoprolol (although she didn't miss any doses of this).  BP 119/93, A.Flutter in 130s, sustained for past 15+ mins now.  O2 sat has dropped to 89% in that time frame despite 15L.  Will order 5mg  IV metoprolol.

## 2019-01-19 NOTE — Progress Notes (Signed)
Inpatient Diabetes Program Recommendations  AACE/ADA: New Consensus Statement on Inpatient Glycemic Control (2015)  Target Ranges:  Prepandial:   less than 140 mg/dL      Peak postprandial:   less than 180 mg/dL (1-2 hours)      Critically ill patients:  140 - 180 mg/dL   Lab Results  Component Value Date   GLUCAP 258 (H) 01/19/2019   HGBA1C 7.1 (H) 01/15/2019    Review of Glycemic Control Results for Stacey Burns, Stacey Burns (MRN [id number]) as of 01/19/2019 10:16  Ref. Range 01/18/2019 07:41 01/18/2019 11:54 01/18/2019 16:36 01/18/2019 21:23 01/19/2019 08:14  Glucose-Capillary Latest Ref Range: 70 - 99 mg/dL 183 (H) 227 (H) 267 (H) 217 (H) 258 (H)   Diabetes history: DM 2 Outpatient Diabetes medications: NPH 2-10 units per SSI + Ozempic 0.25 mg Qweek   Current orders for Inpatient glycemic control:  Lantus 15 QD + Novolog moderate scale 0-15 units tid + Novolog 0-5 units qhs + Novolog 4 units tid meal coverage  Solumedrol 40 mg Q8 hours  Inpatient Diabetes Program Recommendations:    Glucose in 200 range. Consider Lantus 20 units.  Thanks,  Tama Headings RN, MSN, BC-ADM Inpatient Diabetes Coordinator Team Pager [phone] (8a-5p)

## 2019-01-19 NOTE — Progress Notes (Signed)
Received a call from Ms. Laurance Flatten, case Freight forwarder at [place] [facility] requesting that since the patient is in custody of [facility] we must reach out to [employer] if the patient passes away. [employer] or the [facility] will then reach out to the family informing of the patients passing. The body can not be released to anyone until the [facility] release the body.  I informed Ms. Laurance Flatten that I will pass this information onto the charge nurse, Ms. Laurance Flatten has requested that the Charge nurse call her. Information was given to Rod Holler to call Ms. Moore at [phone] ext [phone]

## 2019-01-20 LAB — COMPREHENSIVE METABOLIC PANEL
ALT: 21 U/L (ref 0–44)
AST: 28 U/L (ref 15–41)
Albumin: 2.3 g/dL — ABNORMAL LOW (ref 3.5–5.0)
Alkaline Phosphatase: 50 U/L (ref 38–126)
Anion gap: 9 (ref 5–15)
BUN: 23 mg/dL (ref 8–23)
CO2: 29 mmol/L (ref 22–32)
Calcium: 8.1 mg/dL — ABNORMAL LOW (ref 8.9–10.3)
Chloride: 99 mmol/L (ref 98–111)
Creatinine, Ser: 0.91 mg/dL (ref 0.44–1.00)
GFR calc Af Amer: >60 mL/min (ref >60)
GFR calc non Af Amer: >60 mL/min (ref >60)
Glucose, Bld: 226 mg/dL — ABNORMAL HIGH (ref 70–99)
Potassium: 4.3 mmol/L (ref 3.5–5.1)
Sodium: 137 mmol/L (ref 135–145)
Total Bilirubin: 0.6 mg/dL (ref 0.3–1.2)
Total Protein: 7.4 g/dL (ref 6.5–8.1)

## 2019-01-20 LAB — CBC WITH DIFFERENTIAL/PLATELET
Abs Immature Granulocytes: 0.03 10*3/uL (ref 0.00–0.07)
Basophils Absolute: 0 10*3/uL (ref 0.0–0.1)
Basophils Relative: 0 %
Eosinophils Absolute: 0 10*3/uL (ref 0.0–0.5)
Eosinophils Relative: 0 %
HCT: 40.9 % (ref 36.0–46.0)
Hemoglobin: 13.6 g/dL (ref 12.0–15.0)
Immature Granulocytes: 0 %
Lymphocytes Relative: 6 %
Lymphs Abs: 0.4 10*3/uL — ABNORMAL LOW (ref 0.7–4.0)
MCH: 28.5 pg (ref 26.0–34.0)
MCHC: 33.3 g/dL (ref 30.0–36.0)
MCV: 85.6 fL (ref 80.0–100.0)
Monocytes Absolute: 0.4 10*3/uL (ref 0.1–1.0)
Monocytes Relative: 5 %
Neutro Abs: 6.5 10*3/uL (ref 1.7–7.7)
Neutrophils Relative %: 89 %
Platelets: 348 10*3/uL (ref 150–400)
RBC: 4.78 MIL/uL (ref 3.87–5.11)
RDW: 16.9 % — ABNORMAL HIGH (ref 11.5–15.5)
WBC: 7.3 10*3/uL (ref 4.0–10.5)
nRBC: 0 % (ref 0.0–0.2)

## 2019-01-20 LAB — CULTURE, BLOOD (ROUTINE X 2): Culture: NO GROWTH

## 2019-01-20 LAB — GLUCOSE, CAPILLARY
Glucose-Capillary: 329 mg/dL — ABNORMAL HIGH (ref 70–99)
Glucose-Capillary: 396 mg/dL — ABNORMAL HIGH (ref 70–99)
Glucose-Capillary: 253 mg/dL — ABNORMAL HIGH (ref 70–99)
Glucose-Capillary: 378 mg/dL — ABNORMAL HIGH (ref 70–99)

## 2019-01-20 LAB — FERRITIN: Ferritin: 165 ng/mL (ref 11–307)

## 2019-01-20 LAB — D-DIMER, QUANTITATIVE: D-Dimer, Quant: 1.7 ug/mL-FEU — ABNORMAL HIGH (ref 0.00–0.50)

## 2019-01-20 LAB — C-REACTIVE PROTEIN: CRP: 6.2 mg/dL — ABNORMAL HIGH (ref <1.0)

## 2019-01-20 MED ORDER — METOPROLOL TARTRATE 25 MG PO TABS: 12.5000 mg | ORAL_TABLET | Freq: Two times a day (BID) | ORAL | Status: DC

## 2019-01-20 NOTE — Evaluation (Signed)
Physical Therapy Evaluation Patient Details Name: Stacey Burns MRN: [id number] DOB: 09-04-1955 Today's Date: 01/20/2019   History of Present Illness  Patient is a 63 y.o. female with PMHx of lupus , DM, HTN, A. fib who presented 8/2  with 1 week history of abdominal cramps, chest discomfort and progressive shortness of breath-she was found to have acute hypoxic respiratory failure in the setting of COVID-19 pneumonia.  Hospital course complicated by worsening hypoxemia requiring 15 L high flow oxygen.  Clinical Impression  The patient is very motivated. Patient on 12 L HFNC in room. Placed on 10 L HFNC as O2 tank does not have 12 L, 10 or 15). [facility] stood at sink to wash hand =s and returned to recliner for 5 min rest break. Ambulated 20 x 2 on 10 L HFNC Patient monitored  With Nelcor forehead probe with SaO2 dropping to 77% The patient require 4 mins of rest between walks. The patient is mobilie, just pulmonary compromised.Patient plans return home with family but would consider SNF foir rehab.  Follow Up Recommendations Snf vs HHPT    Equipment Recommendations  Rolling walker with 5" wheels    Recommendations for Other Services       Precautions / Restrictions Precautions Precautions: Fall Precaution Comments: on HFNC, desats      Mobility  Bed Mobility               General bed mobility comments: OOB in recliner  Transfers Overall transfer level: Needs assistance   Transfers: Sit to/from Stand;Stand Pivot Transfers Sit to Stand: Supervision Stand pivot transfers: Supervision       General transfer comment: no assist required[facility]  Ambulation/Gait Ambulation/Gait assistance: Min guard Gait Distance (Feet): 20 Feet(x 2) Assistive device: Rolling walker (2 wheeled) Gait Pattern/deviations: Step-through pattern     General Gait Details: gait very slow, but steady.  Stairs            Wheelchair Mobility     Modified Rankin (Stroke Patients Only)       Balance Overall balance assessment: Needs assistance   Sitting balance-Leahy Scale: Good     Standing balance support: No upper extremity supported;During functional activity Standing balance-Leahy Scale: Fair                               Pertinent Vitals/Pain Pain Assessment: No/denies pain    Home Living Family/patient expects to be discharged to:: Private residence Living Arrangements: Children Available Help at Discharge: Family;Available 24 hours/day Type of Home: House       Home Layout: Two level Home Equipment: None      Prior Function Level of Independence: Independent               Hand Dominance   Dominant Hand: Right    Extremity/Trunk Assessment   Upper Extremity Assessment Upper Extremity Assessment: Defer to OT evaluation    Lower Extremity Assessment Lower Extremity Assessment: Generalized weakness    Cervical / Trunk Assessment Cervical / Trunk Assessment: Normal  Communication   Communication: No difficulties  Cognition Arousal/Alertness: Awake/alert Behavior During Therapy: WFL for tasks assessed/performed Overall Cognitive Status: Within Functional Limits for tasks assessed  General Comments      Exercises     Assessment/Plan    PT Assessment Patient needs continued PT services  PT Problem List Decreased strength;Decreased mobility;Decreased activity tolerance;Cardiopulmonary status limiting activity;Decreased knowledge of use of DME       PT Treatment Interventions DME instruction;Therapeutic activities;Gait training;Therapeutic exercise;Stair training;Functional mobility training;Patient/family education    PT Goals (Current goals can be found in the Care Plan section)  Acute Rehab PT Goals Patient Stated Goal: to go to the homeplace where ther are no steps PT Goal Formulation: With patient Time For  Goal Achievement: 02/03/19 Potential to Achieve Goals: Good    Frequency Min 3X/week   Barriers to discharge        Co-evaluation               AM-PAC PT "6 Clicks" Mobility  Outcome Measure Help needed turning from your back to your side while in a flat bed without using bedrails?: A Little Help needed moving from lying on your back to sitting on the side of a flat bed without using bedrails?: A Little Help needed moving to and from a bed to a chair (including a wheelchair)?: A Little Help needed standing up from a chair using your arms (e.g., wheelchair or bedside chair)?: A Little Help needed to walk in hospital room?: A Little Help needed climbing 3-5 steps with a railing? : A Lot 6 Click Score: 17    End of Session Equipment Utilized During Treatment: Oxygen Activity Tolerance: Patient tolerated treatment well Patient left: in chair;with call bell/phone within reach Nurse Communication: Mobility status PT Visit Diagnosis: Unsteadiness on feet (R26.81);Difficulty in walking, not elsewhere classified (R26.2)    Time: 1950-9326 PT Time Calculation (min) (ACUTE ONLY): 46 min   Charges:   PT Evaluation $PT Eval Moderate Complexity: 1 Mod PT Treatments $Gait Training: 23-37 mins         [name] PT Acute Rehabilitation Services Pager [phone] Office [phone]   Stacey Burns 01/20/2019, 1:38 PM

## 2019-01-20 NOTE — Progress Notes (Signed)
PROGRESS NOTE                                                                                                                                                                                                             Patient Demographics:    Stacey Burns, is a 63 y.o. female, DOB - 1956/04/30, ZOX:[id number]  Outpatient Primary MD for the patient is Tollie EthChurch, Shea N, PA-C   Admit date - 01/15/2019   LOS - 4  Chief Complaint  Patient presents with   Chest Pain       Brief Narrative: Patient is a 63 y.o. female with PMHx of lupus on Imuran, DM, HTN, A. fib on Eliquis who presented with 1 week history of abdominal cramps, chest discomfort and progressive shortness of breath-she was found to have acute hypoxic respiratory failure in the setting of COVID-19 pneumonia.  Hospital course complicated by worsening hypoxemia requiring 15 L high flow oxygen.   Subjective:    Stacey Burns feels slightly better-she has been titrated down to 12 L of oxygen this morning (was on 15 L overnight)   Assessment  & Plan :   Acute Hypoxic Resp Failure due to Covid 19 Viral pneumonia: Minimally improved-Down to 12 L of oxygen.  Continue steroids and Remdesivir.  She is s/p Actemra on 8/6.  Although no signs of volume overload-continue with IV Lasix to keep patient in negative balance.  Inflammatory markers are downtrending-encourage incentive spirometry, prone position.  Monitor closely-if she shows signs of instability-we will transfer to ICU.  COVID-19 Labs: Recent Labs    01/18/19 0155 01/19/19 0215 01/20/19 0142  DDIMER  --  0.78* 1.70*  FERRITIN  --  188 165  CRP 18.7* 10.3* 6.2*    COVID-19 Medications: Steroids: Decadron 8/3-8/5, Solu-Medrol 8/5>> Remdesivir: 8/3>> Actemra: 8/6x1 Convalescent Plasma: N/A Research Studies: N/A  A. fib/atrial fibrillation with RVR with spells of bradycardia: Rate overall better  controlled-hardly any episodes of RVR overnight-however has had episodes of bradycardia with nonconducted PACs (telemetry strips reviewed with Dr. Campbell StallVaranasi-cardiology).  Continue with beta-blocker-plans are to continue with amiodarone-and to taper dosage further over the next couple of days.  Chronic diastolic heart failure: Compensated-continue Lasix in an attempt to keep patient in negative balance.  Continue to follow weights, electrolytes, intake and output.    HTN: Controlled-monitor off  antihypertensives  Insulin-dependent DM-2: CBG still on the higher side-we will get first dose of 20 units of Lantus today-continue with NovoLog 4 units and SSI.  Follow and adjust accordingly.    Lupus: Appears to be stable-apparently was diagnosed with pancolitis in 2015-continue to hold Imuran-see above regarding discussion about Actemra.  COPD/asthma: No wheezing-appears stable-continue as needed bronchodilators      OSA: Not on CPAP due to COVID-on high flow oxygen  Coagulase-negative Staphylococcus bacteremia times 1 out of 2 cultures: Likely contaminant-no indication of infection  Obesity: Estimated body mass index is 46.17 kg/m as calculated from the following:   Height as of this encounter: 5\' 4"  (1.626 m).   Weight as of this encounter: 122 kg.   ABG:    Component Value Date/Time   PHART 7.498 (H) 01/18/2019 1018   PCO2ART 33.5 01/18/2019 1018   PO2ART 46.0 (L) 01/18/2019 1018   HCO3 26.0 01/18/2019 1018   TCO2 27 01/18/2019 1018   O2SAT 86.0 01/18/2019 1018      Condition - Extremely Guarded-very tenuous with risk for further deterioration  Family Communication  : Brother updated over the phone on 8/7  Code Status :  Full Code  Diet :  Diet Order            Diet Carb Modified Fluid consistency: Thin; Room service appropriate? Yes  Diet effective now               Disposition Plan  :  Remain hospitalized-not stable for discharge  Consults  :  None  Procedures  :   None  GI prophylaxis: PPI  DVT Prophylaxis  : Eliquis  Lab Results  Component Value Date   PLT 348 01/20/2019    Antibiotics  :    Anti-infectives (From admission, onward)   Start     Dose/Rate Route Frequency Ordered Stop   01/17/19 1700  remdesivir 100 mg in sodium chloride 0.9 % 250 mL IVPB     100 mg 500 mL/hr over 30 Minutes Intravenous Every 24 hours 01/16/19 1617 01/21/19 1659   01/16/19 1700  remdesivir 200 mg in sodium chloride 0.9 % 250 mL IVPB     200 mg 500 mL/hr over 30 Minutes Intravenous Once 01/16/19 1617 01/16/19 1824      Inpatient Medications  Scheduled Meds:  amiodarone  200 mg Oral BID   apixaban  5 mg Oral BID   baclofen  10 mg Oral QHS   furosemide  40 mg Intravenous Daily   insulin aspart  0-15 Units Subcutaneous TID WC   insulin aspart  0-5 Units Subcutaneous QHS   insulin aspart  4 Units Subcutaneous TID WC   insulin glargine  20 Units Subcutaneous Daily   methylPREDNISolone (SOLU-MEDROL) injection  40 mg Intravenous Q8H   pantoprazole  40 mg Oral Daily   sodium chloride flush  10-40 mL Intracatheter Q12H   sodium chloride flush  3 mL Intravenous Q12H   umeclidinium bromide  1 puff Inhalation Daily   Continuous Infusions:  remdesivir 100 mg in NS 250 mL 100 mg (01/19/19 1637)   PRN Meds:.acetaminophen, ALPRAZolam, chlorpheniramine-HYDROcodone, diphenhydrAMINE, guaiFENesin-dextromethorphan, levalbuterol, lip balm, ondansetron **OR** ondansetron (ZOFRAN) IV, oxyCODONE, sodium chloride flush, zolpidem   Time Spent in minutes  45  See all Orders from today for further details   Oren Binet M.D on 01/20/2019 at 10:47 AM  To page go to [email or web address] - use universal password  [facility] -  Office  [phone]  Objective:   Vitals:   01/20/19 0326 01/20/19 0410 01/20/19 0600 01/20/19 0752  BP:  136/75  110/74  Pulse: (!) 52 (!) 57  69  Resp: (!) 4 (!) 35  (!) 23  Temp:  98.3 F (36.8 C)  (!) 97.1 F (36.2  C)  TempSrc:  Oral    SpO2: 92% 90%  (!) 88%  Weight:   122 kg   Height:        Wt Readings from Last 3 Encounters:  01/20/19 122 kg  07/27/18 119.3 kg     Intake/Output Summary (Last 24 hours) at 01/20/2019 1047 Last data filed at 01/20/2019 0630 Gross per 24 hour  Intake 847.04 ml  Output 500 ml  Net 347.04 ml     Physical Exam Gen Exam:Alert awake-not in any distress HEENT:atraumatic, normocephalic Chest: B/L clear to auscultation anteriorly-but rales bilaterally posteriorly CVS:S1S2 regular Abdomen:soft non tender, non distended Extremities:no edema Neurology: Non focal Skin: no rash   Data Review:    CBC Recent Labs  Lab 01/15/19 0910 01/17/19 0225 01/18/19 0155 01/18/19 1018 01/19/19 0215 01/20/19 0142  WBC 5.7 8.4 8.7  --  8.0 7.3  HGB 14.1 12.6 12.8 13.3 13.7 13.6  HCT 43.9 38.7 39.0 39.0 41.6 40.9  PLT 199 215 274  --  326 348  MCV 90.9 88.8 87.6  --  86.7 85.6  MCH 29.2 28.9 28.8  --  28.5 28.5  MCHC 32.1 32.6 32.8  --  32.9 33.3  RDW 16.7* 17.2* 17.2*  --  17.0* 16.9*  LYMPHSABS 0.6* 0.3* 0.6*  --  0.5* 0.4*  MONOABS 0.5 0.3 0.4  --  0.3 0.4  EOSABS 0.0 0.0 0.0  --  0.0 0.0  BASOSABS 0.0 0.0 0.0  --  0.0 0.0    Chemistries  Recent Labs  Lab 01/15/19 0910 01/17/19 0225 01/18/19 0155 01/18/19 1018 01/19/19 0215 01/20/19 0142  NA 137 138 138 139 139 137  K 3.7 4.7 4.7 4.2 4.3 4.3  CL 104 103 103  --  100 99  CO2 24 27 30   --  28 29  GLUCOSE 108* 196* 216*  --  218* 226*  BUN 10 12 20   --  23 23  CREATININE 0.89 0.75 0.70  --  0.79 0.91  CALCIUM 8.1* 8.3* 8.2*  --  8.3* 8.1*  MG  --  2.1 2.2  --   --   --   AST 40 34 25  --  36 28  ALT 21 17 18   --  22 21  ALKPHOS 47 38 41  --  50 50  BILITOT 0.9 0.3 0.4  --  0.5 0.6   ------------------------------------------------------------------------------------------------------------------ No results for input(s): CHOL, HDL, LDLCALC, TRIG, CHOLHDL, LDLDIRECT in the last 72 hours.  Lab  Results  Component Value Date   HGBA1C 7.1 (H) 01/15/2019   ------------------------------------------------------------------------------------------------------------------ No results for input(s): TSH, T4TOTAL, T3FREE, THYROIDAB in the last 72 hours.  Invalid input(s): FREET3 ------------------------------------------------------------------------------------------------------------------ Recent Labs    01/19/19 0215 01/20/19 0142  FERRITIN 188 165    Coagulation profile No results for input(s): INR, PROTIME in the last 168 hours.  Recent Labs    01/19/19 0215 01/20/19 0142  DDIMER 0.78* 1.70*    Cardiac Enzymes No results for input(s): CKMB, TROPONINI, MYOGLOBIN in the last 168 hours.  Invalid input(s): CK ------------------------------------------------------------------------------------------------------------------    Component Value Date/Time   BNP 127.1 (H) 01/19/2019 0215    Micro Results Recent Results (from the past  240 hour(s))  SARS Coronavirus 2 [facility] order, Performed in [facility] hospital lab) Nasopharyngeal Nasopharyngeal Swab     Status: Abnormal   Collection Time: 01/15/19 10:35 AM   Specimen: Nasopharyngeal Swab  Result Value Ref Range Status   SARS Coronavirus 2 POSITIVE (A) NEGATIVE Final    Comment: CRITICAL RESULT CALLED TO, READ BACK BY AND VERIFIED WITH: RN ELIZABETH TAYLOR [phone] FCP (NOTE) If result is NEGATIVE SARS-CoV-2 target nucleic acids are NOT DETECTED. The SARS-CoV-2 RNA is generally detectable in upper and lower  respiratory specimens during the acute phase of infection. The lowest  concentration of SARS-CoV-2 viral copies this assay can detect is 250  copies / mL. A negative result does not preclude SARS-CoV-2 infection  and should not be used as the sole basis for treatment or other  patient management decisions.  A negative result may occur with  improper specimen collection / handling, submission of specimen  other  than nasopharyngeal swab, presence of viral mutation(s) within the  areas targeted by this assay, and inadequate number of viral copies  (<250 copies / mL). A negative result must be combined with clinical  observations, patient history, and epidemiological information. If result is POSITIVE SARS-CoV-2 target nucleic acids are DET ECTED. The SARS-CoV-2 RNA is generally detectable in upper and lower  respiratory specimens during the acute phase of infection.  Positive  results are indicative of active infection with SARS-CoV-2.  Clinical  correlation with patient history and other diagnostic information is  necessary to determine patient infection status.  Positive results do  not rule out bacterial infection or co-infection with other viruses. If result is PRESUMPTIVE POSTIVE SARS-CoV-2 nucleic acids MAY BE PRESENT.   A presumptive positive result was obtained on the submitted specimen  and confirmed on repeat testing.  While 2019 novel coronavirus  (SARS-CoV-2) nucleic acids may be present in the submitted sample  additional confirmatory testing may be necessary for epidemiological  and / or clinical management purposes  to differentiate between  SARS-CoV-2 and other Sarbecovirus currently known to infect humans.  If clinically indicated additional testing with an alternate test  methodology (LA [phone]) is advised. The SARS-CoV-2 RNA is generally  detectable in upper and lower respiratory specimens during the acute  phase of infection. The expected result is Negative. Fact Sheet for Patients:  BoilerBrush.com.cy Fact Sheet for Healthcare Providers: https://pope.com/ This test is not yet approved or cleared by the [place] FDA and has been authorized for detection and/or diagnosis of SARS-CoV-2 by FDA under an Emergency Use Authorization (EUA).  This EUA will remain in effect (meaning this test can be used) for the duration  of the COVID-19 declaration under Section 564(b)(1) of the Act, 21 U.S.C. section 360bbb-3(b)(1), unless the authorization is terminated or revoked sooner. Performed at [facility], [address] [place], [place] [address]   Blood Culture (routine x 2)     Status: Abnormal   Collection Time: 01/15/19  3:00 PM   Specimen: BLOOD  Result Value Ref Range Status   Specimen Description BLOOD RIGHT ANTECUBITAL  Final   Special Requests   Final    BOTTLES DRAWN AEROBIC AND ANAEROBIC Blood Culture adequate volume   Culture  Setup Time   Final    ANAEROBIC BOTTLE ONLY GRAM POSITIVE COCCI IN CLUSTERS CRITICAL RESULT CALLED TO, READ BACK BY AND VERIFIED WITH: Lois Huxley PharmD 15:10 01/16/19 ([email or web address])    Culture (A)  Final    STAPHYLOCOCCUS SPECIES (COAGULASE NEGATIVE)  THE SIGNIFICANCE OF ISOLATING THIS ORGANISM FROM A SINGLE SET OF BLOOD CULTURES WHEN MULTIPLE SETS ARE DRAWN IS UNCERTAIN. PLEASE NOTIFY THE MICROBIOLOGY DEPARTMENT WITHIN ONE WEEK IF SPECIATION AND SENSITIVITIES ARE REQUIRED. Performed at [facility], [address], [place], [place] [address]    Report Status 01/17/2019 FINAL  Final  Blood Culture ID Panel (Reflexed)     Status: Abnormal   Collection Time: 01/15/19  3:00 PM  Result Value Ref Range Status   Enterococcus species NOT DETECTED NOT DETECTED Final   Listeria monocytogenes NOT DETECTED NOT DETECTED Final   Staphylococcus species DETECTED (A) NOT DETECTED Final    Comment: Methicillin (oxacillin) susceptible coagulase negative staphylococcus. Possible blood culture contaminant (unless isolated from more than one blood culture draw or clinical case suggests pathogenicity). No antibiotic treatment is indicated for blood  culture contaminants. CRITICAL RESULT CALLED TO, READ BACK BY AND VERIFIED WITH: Lois Huxley PharmD 15:10 01/16/19 ([email or web address])    Staphylococcus aureus (BCID) NOT DETECTED NOT DETECTED Final   Methicillin resistance NOT DETECTED NOT DETECTED  Final   Streptococcus species NOT DETECTED NOT DETECTED Final   Streptococcus agalactiae NOT DETECTED NOT DETECTED Final   Streptococcus pneumoniae NOT DETECTED NOT DETECTED Final   Streptococcus pyogenes NOT DETECTED NOT DETECTED Final   Acinetobacter baumannii NOT DETECTED NOT DETECTED Final   Enterobacteriaceae species NOT DETECTED NOT DETECTED Final   Enterobacter cloacae complex NOT DETECTED NOT DETECTED Final   Escherichia coli NOT DETECTED NOT DETECTED Final   Klebsiella oxytoca NOT DETECTED NOT DETECTED Final   Klebsiella pneumoniae NOT DETECTED NOT DETECTED Final   Proteus species NOT DETECTED NOT DETECTED Final   Serratia marcescens NOT DETECTED NOT DETECTED Final   Haemophilus influenzae NOT DETECTED NOT DETECTED Final   Neisseria meningitidis NOT DETECTED NOT DETECTED Final   Pseudomonas aeruginosa NOT DETECTED NOT DETECTED Final   Candida albicans NOT DETECTED NOT DETECTED Final   Candida glabrata NOT DETECTED NOT DETECTED Final   Candida krusei NOT DETECTED NOT DETECTED Final   Candida parapsilosis NOT DETECTED NOT DETECTED Final   Candida tropicalis NOT DETECTED NOT DETECTED Final    Comment: Performed at [facility], [address], [place], [place] [address]  Blood Culture (routine x 2)     Status: None   Collection Time: 01/15/19  7:50 PM   Specimen: BLOOD RIGHT HAND  Result Value Ref Range Status   Specimen Description BLOOD RIGHT HAND  Final   Special Requests   Final    BOTTLES DRAWN AEROBIC AND ANAEROBIC Blood Culture results may not be optimal due to an inadequate volume of blood received in culture bottles   Culture   Final    NO GROWTH 5 DAYS Performed at [facility], [address], [place], [place] [address]    Report Status 01/20/2019 FINAL  Final    Radiology Reports Ct Angio Chest Pe W And/or Wo Contrast  Result Date: 01/15/2019 CLINICAL DATA:  EMS call for CP and SHOB x 1 week . Pt reported Pain increased with movement. Pt has Dx of  A-fib and a ablation the patient . Pt given 324 ASA and one NGT SL . BP 120/70 prior to meds and BP 74/40 after meds. On arrival EMS reported BP 120/70. Pt reports relief of CP From meds. HR 80, CBG 137, 97.2 temp. EXAM: CT ANGIOGRAPHY CHEST CT ABDOMEN AND PELVIS WITH CONTRAST TECHNIQUE: Multidetector CT imaging of the chest was performed using the standard protocol  during bolus administration of intravenous contrast. Multiplanar CT image reconstructions and MIPs were obtained to evaluate the vascular anatomy. Multidetector CT imaging of the abdomen and pelvis was performed using the standard protocol during bolus administration of intravenous contrast. CONTRAST:  100mL OMNIPAQUE IOHEXOL 350 MG/ML SOLN COMPARISON:  None. FINDINGS: CTA CHEST FINDINGS Cardiovascular: There is satisfactory opacification of the pulmonary arteries to the segmental level. There is no evidence of a pulmonary embolism. Heart is normal in size. No pericardial effusion. Minimal three-vessel coronary artery calcifications. Great vessels are normal in caliber. Mild aortic atherosclerosis. No dissection. Mediastinum/Nodes: Large hiatal hernia, lying to the right of the lower thoracic spine. Esophagus unremarkable. Trachea widely patent. Normal thyroid. No neck base, axillary, mediastinal or hilar masses or enlarged lymph nodes. Lungs/Pleura: Bilateral, peripheral ground-glass airspace opacities. Slight upper lobe predominance. No lung masses or suspicious nodules. No interstitial thickening. No pleural effusion or pneumothorax. Musculoskeletal: No fracture or acute finding. No osteoblastic or osteolytic lesions. Review of the MIP images confirms the above findings. CT ABDOMEN and PELVIS FINDINGS Hepatobiliary: Decreased attenuation of the liver consistent with fatty infiltration. No liver mass or focal lesion. Liver normal in size. Normal gallbladder. No bile duct dilation. Pancreas: Unremarkable. No pancreatic ductal dilatation or surrounding  inflammatory changes. Spleen: Normal in size without focal abnormality. Adrenals/Urinary Tract: Adrenal glands are unremarkable. Kidneys are normal, without renal calculi, focal lesion, or hydronephrosis. Bladder is unremarkable. Stomach/Bowel: Stomach unremarkable other than the large hiatal hernia. Small bowel and colon are normal in caliber. No wall thickening. No inflammation. There are colonic diverticula mostly along the left colon. No diverticulitis. Normal appendix visualized. Vascular/Lymphatic: Mild aortic atherosclerosis.  No aneurysm. No enlarged lymph nodes. Reproductive: Small partly calcified uterine fibroids. Uterus normal in overall size. No ovarian/adnexal masses. Other: No abdominal wall hernia or abnormality. No abdominopelvic ascites. Musculoskeletal: No fracture or acute finding. No osteoblastic or osteolytic lesions. Review of the MIP images confirms the above findings. IMPRESSION: CTA CHEST 1. No evidence of a pulmonary embolism. 2. Bilateral peripheral ground-glass airspace lung opacities with a slight upper lobe predominance. Findings consistent with multifocal infection. Consider atypical infection including viral infection. Noninfectious lung inflammation may also have this appearance. 3. No other acute findings in the chest. 4. Large hiatal hernia. CT ABDOMEN AND PELVIS 1. No acute findings within the abdomen or pelvis. 2. Hepatic steatosis. 3. Aortic atherosclerosis. 4. Scattered colonic diverticula without diverticulitis. Electronically Signed   By: Amie Portlandavid  Ormond M.D.   On: 01/15/2019 12:01   Ct Abdomen Pelvis W Contrast  Result Date: 01/15/2019 CLINICAL DATA:  EMS call for CP and SHOB x 1 week . Pt reported Pain increased with movement. Pt has Dx of A-fib and a ablation the patient . Pt given 324 ASA and one NGT SL . BP 120/70 prior to meds and BP 74/40 after meds. On arrival EMS reported BP 120/70. Pt reports relief of CP From meds. HR 80, CBG 137, 97.2 temp. EXAM: CT ANGIOGRAPHY  CHEST CT ABDOMEN AND PELVIS WITH CONTRAST TECHNIQUE: Multidetector CT imaging of the chest was performed using the standard protocol during bolus administration of intravenous contrast. Multiplanar CT image reconstructions and MIPs were obtained to evaluate the vascular anatomy. Multidetector CT imaging of the abdomen and pelvis was performed using the standard protocol during bolus administration of intravenous contrast. CONTRAST:  100mL OMNIPAQUE IOHEXOL 350 MG/ML SOLN COMPARISON:  None. FINDINGS: CTA CHEST FINDINGS Cardiovascular: There is satisfactory opacification of the pulmonary arteries to the segmental level. There is no  evidence of a pulmonary embolism. Heart is normal in size. No pericardial effusion. Minimal three-vessel coronary artery calcifications. Great vessels are normal in caliber. Mild aortic atherosclerosis. No dissection. Mediastinum/Nodes: Large hiatal hernia, lying to the right of the lower thoracic spine. Esophagus unremarkable. Trachea widely patent. Normal thyroid. No neck base, axillary, mediastinal or hilar masses or enlarged lymph nodes. Lungs/Pleura: Bilateral, peripheral ground-glass airspace opacities. Slight upper lobe predominance. No lung masses or suspicious nodules. No interstitial thickening. No pleural effusion or pneumothorax. Musculoskeletal: No fracture or acute finding. No osteoblastic or osteolytic lesions. Review of the MIP images confirms the above findings. CT ABDOMEN and PELVIS FINDINGS Hepatobiliary: Decreased attenuation of the liver consistent with fatty infiltration. No liver mass or focal lesion. Liver normal in size. Normal gallbladder. No bile duct dilation. Pancreas: Unremarkable. No pancreatic ductal dilatation or surrounding inflammatory changes. Spleen: Normal in size without focal abnormality. Adrenals/Urinary Tract: Adrenal glands are unremarkable. Kidneys are normal, without renal calculi, focal lesion, or hydronephrosis. Bladder is unremarkable.  Stomach/Bowel: Stomach unremarkable other than the large hiatal hernia. Small bowel and colon are normal in caliber. No wall thickening. No inflammation. There are colonic diverticula mostly along the left colon. No diverticulitis. Normal appendix visualized. Vascular/Lymphatic: Mild aortic atherosclerosis.  No aneurysm. No enlarged lymph nodes. Reproductive: Small partly calcified uterine fibroids. Uterus normal in overall size. No ovarian/adnexal masses. Other: No abdominal wall hernia or abnormality. No abdominopelvic ascites. Musculoskeletal: No fracture or acute finding. No osteoblastic or osteolytic lesions. Review of the MIP images confirms the above findings. IMPRESSION: CTA CHEST 1. No evidence of a pulmonary embolism. 2. Bilateral peripheral ground-glass airspace lung opacities with a slight upper lobe predominance. Findings consistent with multifocal infection. Consider atypical infection including viral infection. Noninfectious lung inflammation may also have this appearance. 3. No other acute findings in the chest. 4. Large hiatal hernia. CT ABDOMEN AND PELVIS 1. No acute findings within the abdomen or pelvis. 2. Hepatic steatosis. 3. Aortic atherosclerosis. 4. Scattered colonic diverticula without diverticulitis. Electronically Signed   By: Amie Portlandavid  Ormond M.D.   On: 01/15/2019 12:01   Dg Chest Port 1 View  Result Date: 01/15/2019 CLINICAL DATA:  Fall 7/25, chest pain/SOB since then. Hx of stroke, lupus, hypertension, diabetes, coronary artery disease, asthma, afib. Nonsmoker. EXAM: PORTABLE CHEST 1 VIEW COMPARISON:  None. FINDINGS: Cardiac silhouette is mildly enlarged. No mediastinal or hilar masses. Subtle hazy airspace opacities are noted in the peripheral left mid and lower lung, possibly in the peripheral right mid lung. There are mildly prominent bronchovascular markings diffusely. No convincing pleural effusion.  No pneumothorax. Skeletal structures are grossly intact. IMPRESSION: 1. Subtle  hazy airspace opacities in the left mid and lower lung and possibly right peripheral mid lung. Consider multifocal pneumonia if there are consistent clinical findings. Electronically Signed   By: Amie Portlandavid  Ormond M.D.   On: 01/15/2019 10:26   Dg Chest Port 1v Same Day  Result Date: 01/18/2019 CLINICAL DATA:  Shortness of breath EXAM: PORTABLE CHEST 1 VIEW COMPARISON:  January 15, 2019 FINDINGS: The study is extremely limited due to positioning. Patchy airspace opacities seen throughout both lungs, left greater than right. The cardiomediastinal silhouette is obscured. IMPRESSION: Extremely limited exam due to patient positioning. Airspace opacities throughout both lungs. Electronically Signed   By: Jonna ClarkBindu  Avutu M.D.   On: 01/18/2019 11:10

## 2019-01-20 NOTE — Progress Notes (Signed)
Inpatient Diabetes Program Recommendations  AACE/ADA: New Consensus Statement on Inpatient Glycemic Control (2015)  Target Ranges:  Prepandial:   less than 140 mg/dL      Peak postprandial:   less than 180 mg/dL (1-2 hours)      Critically ill patients:  140 - 180 mg/dL   Lab Results  Component Value Date   GLUCAP 206 (H) 01/19/2019   HGBA1C 7.1 (H) 01/15/2019    Diabetes history: DM 2 Outpatient Diabetes medications: NPH 2-10 units per SSI + Ozempic 0.25 mg Qweek   Current orders for Inpatient glycemic control:  Lantus 20 QD + Novolog moderate scale 0-15 units tid + Novolog 0-5 units qhs + Novolog 4 units tid meal coverage  Solumedrol 40 mg Q8 hours  Patient to get first dose of Lantus 20 units this am will watch trends today.  Thanks,  Tama Headings RN, MSN, BC-ADM Inpatient Diabetes Coordinator Team Pager [phone] (8a-5p)

## 2019-01-21 LAB — COMPREHENSIVE METABOLIC PANEL
ALT: 22 U/L (ref 0–44)
AST: 27 U/L (ref 15–41)
Albumin: 2.1 g/dL — ABNORMAL LOW (ref 3.5–5.0)
Alkaline Phosphatase: 61 U/L (ref 38–126)
Anion gap: 14 (ref 5–15)
BUN: 26 mg/dL — ABNORMAL HIGH (ref 8–23)
CO2: 24 mmol/L (ref 22–32)
Calcium: 8 mg/dL — ABNORMAL LOW (ref 8.9–10.3)
Chloride: 97 mmol/L — ABNORMAL LOW (ref 98–111)
Creatinine, Ser: 0.92 mg/dL (ref 0.44–1.00)
GFR calc Af Amer: >60 mL/min (ref >60)
GFR calc non Af Amer: >60 mL/min (ref >60)
Glucose, Bld: 309 mg/dL — ABNORMAL HIGH (ref 70–99)
Potassium: 4.7 mmol/L (ref 3.5–5.1)
Sodium: 135 mmol/L (ref 135–145)
Total Bilirubin: 0.4 mg/dL (ref 0.3–1.2)
Total Protein: 6.1 g/dL — ABNORMAL LOW (ref 6.5–8.1)

## 2019-01-21 LAB — CBC
HCT: 41.1 % (ref 36.0–46.0)
Hemoglobin: 13.9 g/dL (ref 12.0–15.0)
MCH: 28.8 pg (ref 26.0–34.0)
MCHC: 33.8 g/dL (ref 30.0–36.0)
MCV: 85.3 fL (ref 80.0–100.0)
Platelets: 299 10*3/uL (ref 150–400)
RBC: 4.82 MIL/uL (ref 3.87–5.11)
RDW: 16.7 % — ABNORMAL HIGH (ref 11.5–15.5)
WBC: 9.1 10*3/uL (ref 4.0–10.5)
nRBC: 0 % (ref 0.0–0.2)

## 2019-01-21 LAB — GLUCOSE, CAPILLARY
Glucose-Capillary: 376 mg/dL — ABNORMAL HIGH (ref 70–99)
Glucose-Capillary: 276 mg/dL — ABNORMAL HIGH (ref 70–99)
Glucose-Capillary: 337 mg/dL — ABNORMAL HIGH (ref 70–99)
Glucose-Capillary: 286 mg/dL — ABNORMAL HIGH (ref 70–99)

## 2019-01-21 LAB — D-DIMER, QUANTITATIVE: D-Dimer, Quant: 4.36 ug/mL-FEU — ABNORMAL HIGH (ref 0.00–0.50)

## 2019-01-21 LAB — FERRITIN: Ferritin: 241 ng/mL (ref 11–307)

## 2019-01-21 LAB — C-REACTIVE PROTEIN: CRP: 2.8 mg/dL — ABNORMAL HIGH (ref <1.0)

## 2019-01-21 MED ORDER — INSULIN ASPART 100 UNIT/ML ~~LOC~~ SOLN
0.0000 [IU] | Freq: Three times a day (TID) | SUBCUTANEOUS | Status: DC
  Administered 2019-01-21: 15 [IU] via SUBCUTANEOUS
  Administered 2019-01-21 – 2019-01-22 (×2): 11 [IU] via SUBCUTANEOUS
  Administered 2019-01-22: 7 [IU] via SUBCUTANEOUS
  Administered 2019-01-22: 15 [IU] via SUBCUTANEOUS
  Administered 2019-01-23 – 2019-01-24 (×4): 11 [IU] via SUBCUTANEOUS
  Administered 2019-01-24: 7 [IU] via SUBCUTANEOUS
  Administered 2019-01-25: 11 [IU] via SUBCUTANEOUS
  Administered 2019-01-25: 4 [IU] via SUBCUTANEOUS
  Administered 2019-01-25: 11 [IU] via SUBCUTANEOUS
  Administered 2019-01-26: 4 [IU] via SUBCUTANEOUS
  Administered 2019-01-26: 7 [IU] via SUBCUTANEOUS
  Administered 2019-01-27 (×2): 4 [IU] via SUBCUTANEOUS

## 2019-01-21 MED ORDER — INSULIN GLARGINE 100 UNIT/ML ~~LOC~~ SOLN
28.0000 [IU] | Freq: Every day | SUBCUTANEOUS | Status: DC
  Administered 2019-01-22 – 2019-01-23 (×2): 28 [IU] via SUBCUTANEOUS
  Filled 2019-01-21 (×2): qty 0.28

## 2019-01-21 MED ORDER — POLYETHYLENE GLYCOL 3350 17 G PO PACK
17.0000 g | PACK | Freq: Every day | ORAL | Status: DC | PRN
  Administered 2019-01-21: 17 g via ORAL
  Filled 2019-01-21: qty 1

## 2019-01-21 MED ORDER — POLYETHYLENE GLYCOL 3350 17 G PO PACK: 17.0000 g | PACK | Freq: Every day | ORAL | Status: DC

## 2019-01-21 MED ORDER — INSULIN ASPART 100 UNIT/ML ~~LOC~~ SOLN
8.0000 [IU] | Freq: Three times a day (TID) | SUBCUTANEOUS | Status: DC
  Administered 2019-01-21 – 2019-01-27 (×19): 8 [IU] via SUBCUTANEOUS

## 2019-01-21 MED ORDER — INSULIN ASPART 100 UNIT/ML ~~LOC~~ SOLN
0.0000 [IU] | Freq: Every day | SUBCUTANEOUS | Status: DC
  Administered 2019-01-21 – 2019-01-24 (×4): 3 [IU] via SUBCUTANEOUS
  Administered 2019-01-26: 4 [IU] via SUBCUTANEOUS

## 2019-01-21 NOTE — Progress Notes (Addendum)
PROGRESS NOTE                                                                                                                                                                                                             Patient Demographics:    Stacey Burns, is a 63 y.o. female, DOB - [birth year], ZOX:[id number]  Outpatient Primary MD for the patient is Tollie EthChurch, Shea N, PA-C   Admit date - 01/15/2019   LOS - 5  Chief Complaint  Patient presents with   Chest Pain       Brief Narrative: Patient is a 63 y.o. female with PMHx of lupus on Imuran, DM, HTN, A. fib on Eliquis who presented with 1 week history of abdominal cramps, chest discomfort and progressive shortness of breath-she was found to have acute hypoxic respiratory failure in the setting of COVID-19 pneumonia.  Hospital course complicated by worsening hypoxemia requiring 15 L high flow oxygen.   Subjective:    Stacey Burns feels slightly better-she has been titrated down to 10 L of oxygen this morning (2 days ago she was on 15 L)    Assessment  & Plan :   Acute Hypoxic Resp Failure due to Covid 19 Viral pneumonia: Seems to be slowly improving-Down to 10 L of oxygen this morning.  She has completed a course of Remdesivir, she was given Actemra x1 on 8/6.  She remains on IV steroids.  She remains on intravenous Lasix-although no signs of volume overload-plans are to maintain negative balance and stable weights.  Inflammatory markers continue to downtrend.  Continue full supportive care-continue encouragement to use incentive spirometry, prone position up to 16 hours a day.    COVID-19 Labs: Recent Labs    01/19/19 0215 01/20/19 0142 01/21/19 0155  DDIMER 0.78* 1.70* 4.36*  FERRITIN 188 165 241  CRP 10.3* 6.2* 2.8*    COVID-19 Medications: Steroids: Decadron 8/3-8/5, Solu-Medrol 8/5>> Remdesivir: 8/3>>8/7 Actemra: 8/6x1 Convalescent Plasma:  N/A Research Studies: N/A  A. fib/atrial fibrillation with RVR with spells of bradycardia: Remains in sinus rhythm with episodes of brief bradycardia with nonconducted PACs overnight.  No longer on beta-blockers-remains on amiodarone with plans to start tapering on 8/9.  Case has been discussed over the past few days with cardiology-Dr. Eldridge DaceVaranasi over the phone.  Chronic diastolic heart failure: Compensated-continue Lasix in  an attempt to keep patient in negative balance.  Continue to follow weights, electrolytes, intake and output.    HTN: Controlled-monitor off antihypertensives  Insulin-dependent DM-2: CBG still remains uncontrolled-increase Lantus to 28 units (first dose will be given on 8/9), increase pre-meal NovoLog to 8 units scheduled, change SSI to resistant.  Follow and adjust accordingly.  Lupus: Appears to be stable-apparently was diagnosed with pancolitis in 2015-continue to hold Imuran-see above regarding discussion about Actemra.  COPD/asthma: No wheezing-appears stable-continue as needed bronchodilators      OSA: Not on CPAP due to COVID-on high flow oxygen  Coagulase-negative Staphylococcus bacteremia times 1 out of 2 cultures: Likely contaminant-no indication of infection  Obesity: Estimated body mass index is 46.17 kg/m as calculated from the following:   Height as of this encounter: 5\' 4"  (1.626 m).   Weight as of this encounter: 122 kg.   ABG:    Component Value Date/Time   PHART 7.498 (H) 01/18/2019 1018   PCO2ART 33.5 01/18/2019 1018   PO2ART 46.0 (L) 01/18/2019 1018   HCO3 26.0 01/18/2019 1018   TCO2 27 01/18/2019 1018   O2SAT 86.0 01/18/2019 1018      Condition - Extremely Guarded-very tenuous with risk for further deterioration  Family Communication  : Brother updated over the phone on 8/8  Code Status :  Full Code  Diet :  Diet Order            Diet Carb Modified Fluid consistency: Thin; Room service appropriate? Yes  Diet effective now                Disposition Plan  :  Remain hospitalized-not stable for discharge  Consults  :  None  Procedures  :  None  GI prophylaxis: PPI  DVT Prophylaxis  : Eliquis  Lab Results  Component Value Date   PLT 299 01/21/2019    Antibiotics  :    Anti-infectives (From admission, onward)   Start     Dose/Rate Route Frequency Ordered Stop   01/17/19 1700  remdesivir 100 mg in sodium chloride 0.9 % 250 mL IVPB     100 mg 500 mL/hr over 30 Minutes Intravenous Every 24 hours 01/16/19 1617 01/20/19 1745   01/16/19 1700  remdesivir 200 mg in sodium chloride 0.9 % 250 mL IVPB     200 mg 500 mL/hr over 30 Minutes Intravenous Once 01/16/19 1617 01/16/19 1824      Inpatient Medications  Scheduled Meds:  amiodarone  200 mg Oral BID   apixaban  5 mg Oral BID   baclofen  10 mg Oral QHS   furosemide  40 mg Intravenous Daily   insulin aspart  0-15 Units Subcutaneous TID WC   insulin aspart  0-5 Units Subcutaneous QHS   insulin aspart  4 Units Subcutaneous TID WC   insulin glargine  20 Units Subcutaneous Daily   methylPREDNISolone (SOLU-MEDROL) injection  40 mg Intravenous Q8H   pantoprazole  40 mg Oral Daily   sodium chloride flush  10-40 mL Intracatheter Q12H   sodium chloride flush  3 mL Intravenous Q12H   umeclidinium bromide  1 puff Inhalation Daily   Continuous Infusions:  PRN Meds:.acetaminophen, ALPRAZolam, chlorpheniramine-HYDROcodone, diphenhydrAMINE, guaiFENesin-dextromethorphan, levalbuterol, lip balm, ondansetron **OR** ondansetron (ZOFRAN) IV, oxyCODONE, sodium chloride flush, zolpidem   Time Spent in minutes  45  See all Orders from today for further details   Jeoffrey MassedShanker Jelicia Nantz M.D on 01/21/2019 at 11:18 AM  To page go to [email or web address] - use universal  password  [facility] -  Office  [phone]    Objective:   Vitals:   01/20/19 1624 01/20/19 2015 01/21/19 0336 01/21/19 0745  BP: 104/64 111/64 131/72 121/67  Pulse: 72 77 60 65  Resp:  (!) 23 (!) 26 (!) 22 18  Temp: 98.5 F (36.9 C) 97.8 F (36.6 C) 98.4 F (36.9 C) 97.6 F (36.4 C)  TempSrc:  Axillary Oral Axillary  SpO2: 90% (!) 83% 92% 92%  Weight:      Height:        Wt Readings from Last 3 Encounters:  01/20/19 122 kg  07/27/18 119.3 kg     Intake/Output Summary (Last 24 hours) at 01/21/2019 1118 Last data filed at 01/21/2019 0845 Gross per 24 hour  Intake 490.06 ml  Output 1250 ml  Net -759.94 ml     Physical Exam Gen Exam:Alert awake-not in any distress HEENT:atraumatic, normocephalic Chest: Bilateral fine rales CVS:S1S2 regular Abdomen:soft non tender, non distended Extremities:no edema Neurology: Non focal Skin: no rash   Data Review:    CBC Recent Labs  Lab 01/15/19 0910 01/17/19 0225 01/18/19 0155 01/18/19 1018 01/19/19 0215 01/20/19 0142 01/21/19 0155  WBC 5.7 8.4 8.7  --  8.0 7.3 9.1  HGB 14.1 12.6 12.8 13.3 13.7 13.6 13.9  HCT 43.9 38.7 39.0 39.0 41.6 40.9 41.1  PLT 199 215 274  --  326 348 299  MCV 90.9 88.8 87.6  --  86.7 85.6 85.3  MCH 29.2 28.9 28.8  --  28.5 28.5 28.8  MCHC 32.1 32.6 32.8  --  32.9 33.3 33.8  RDW 16.7* 17.2* 17.2*  --  17.0* 16.9* 16.7*  LYMPHSABS 0.6* 0.3* 0.6*  --  0.5* 0.4*  --   MONOABS 0.5 0.3 0.4  --  0.3 0.4  --   EOSABS 0.0 0.0 0.0  --  0.0 0.0  --   BASOSABS 0.0 0.0 0.0  --  0.0 0.0  --     Chemistries  Recent Labs  Lab 01/15/19 0910 01/17/19 0225 01/18/19 0155 01/18/19 1018 01/19/19 0215 01/20/19 0142  NA 137 138 138 139 139 137  K 3.7 4.7 4.7 4.2 4.3 4.3  CL 104 103 103  --  100 99  CO2 24 27 30   --  28 29  GLUCOSE 108* 196* 216*  --  218* 226*  BUN 10 12 20   --  23 23  CREATININE 0.89 0.75 0.70  --  0.79 0.91  CALCIUM 8.1* 8.3* 8.2*  --  8.3* 8.1*  MG  --  2.1 2.2  --   --   --   AST 40 34 25  --  36 28  ALT 21 17 18   --  22 21  ALKPHOS 47 38 41  --  50 50  BILITOT 0.9 0.3 0.4  --  0.5 0.6    ------------------------------------------------------------------------------------------------------------------ No results for input(s): CHOL, HDL, LDLCALC, TRIG, CHOLHDL, LDLDIRECT in the last 72 hours.  Lab Results  Component Value Date   HGBA1C 7.1 (H) 01/15/2019   ------------------------------------------------------------------------------------------------------------------ No results for input(s): TSH, T4TOTAL, T3FREE, THYROIDAB in the last 72 hours.  Invalid input(s): FREET3 ------------------------------------------------------------------------------------------------------------------ Recent Labs    01/20/19 0142 01/21/19 0155  FERRITIN 165 241    Coagulation profile No results for input(s): INR, PROTIME in the last 168 hours.  Recent Labs    01/20/19 0142 01/21/19 0155  DDIMER 1.70* 4.36*    Cardiac Enzymes No results for input(s): CKMB, TROPONINI, MYOGLOBIN in the  last 168 hours.  Invalid input(s): CK ------------------------------------------------------------------------------------------------------------------    Component Value Date/Time   BNP 127.1 (H) 01/19/2019 0215    Micro Results Recent Results (from the past 240 hour(s))  SARS Coronavirus 2 [facility] order, Performed in [facility] hospital lab) Nasopharyngeal Nasopharyngeal Swab     Status: Abnormal   Collection Time: 01/15/19 10:35 AM   Specimen: Nasopharyngeal Swab  Result Value Ref Range Status   SARS Coronavirus 2 POSITIVE (A) NEGATIVE Final    Comment: CRITICAL RESULT CALLED TO, READ BACK BY AND VERIFIED WITH: RN ELIZABETH TAYLOR [phone] FCP (NOTE) If result is NEGATIVE SARS-CoV-2 target nucleic acids are NOT DETECTED. The SARS-CoV-2 RNA is generally detectable in upper and lower  respiratory specimens during the acute phase of infection. The lowest  concentration of SARS-CoV-2 viral copies this assay can detect is 250  copies / mL. A negative result does not preclude  SARS-CoV-2 infection  and should not be used as the sole basis for treatment or other  patient management decisions.  A negative result may occur with  improper specimen collection / handling, submission of specimen other  than nasopharyngeal swab, presence of viral mutation(s) within the  areas targeted by this assay, and inadequate number of viral copies  (<250 copies / mL). A negative result must be combined with clinical  observations, patient history, and epidemiological information. If result is POSITIVE SARS-CoV-2 target nucleic acids are DET ECTED. The SARS-CoV-2 RNA is generally detectable in upper and lower  respiratory specimens during the acute phase of infection.  Positive  results are indicative of active infection with SARS-CoV-2.  Clinical  correlation with patient history and other diagnostic information is  necessary to determine patient infection status.  Positive results do  not rule out bacterial infection or co-infection with other viruses. If result is PRESUMPTIVE POSTIVE SARS-CoV-2 nucleic acids MAY BE PRESENT.   A presumptive positive result was obtained on the submitted specimen  and confirmed on repeat testing.  While 2019 novel coronavirus  (SARS-CoV-2) nucleic acids may be present in the submitted sample  additional confirmatory testing may be necessary for epidemiological  and / or clinical management purposes  to differentiate between  SARS-CoV-2 and other Sarbecovirus currently known to infect humans.  If clinically indicated additional testing with an alternate test  methodology (LA [phone]) is advised. The SARS-CoV-2 RNA is generally  detectable in upper and lower respiratory specimens during the acute  phase of infection. The expected result is Negative. Fact Sheet for Patients:  BoilerBrush.com.cy Fact Sheet for Healthcare Providers: https://pope.com/ This test is not yet approved or cleared by the  [place] FDA and has been authorized for detection and/or diagnosis of SARS-CoV-2 by FDA under an Emergency Use Authorization (EUA).  This EUA will remain in effect (meaning this test can be used) for the duration of the COVID-19 declaration under Section 564(b)(1) of the Act, 21 U.S.C. section 360bbb-3(b)(1), unless the authorization is terminated or revoked sooner. Performed at [facility], [address], [place], [place] [address]   Blood Culture (routine x 2)     Status: Abnormal   Collection Time: 01/15/19  3:00 PM   Specimen: BLOOD  Result Value Ref Range Status   Specimen Description BLOOD RIGHT ANTECUBITAL  Final   Special Requests   Final    BOTTLES DRAWN AEROBIC AND ANAEROBIC Blood Culture adequate volume   Culture  Setup Time   Final    ANAEROBIC BOTTLE ONLY GRAM POSITIVE COCCI IN  CLUSTERS CRITICAL RESULT CALLED TO, READ BACK BY AND VERIFIED WITH: Lois Huxley PharmD 15:10 01/16/19 ([email or web address])    Culture (A)  Final    STAPHYLOCOCCUS SPECIES (COAGULASE NEGATIVE) THE SIGNIFICANCE OF ISOLATING THIS ORGANISM FROM A SINGLE SET OF BLOOD CULTURES WHEN MULTIPLE SETS ARE DRAWN IS UNCERTAIN. PLEASE NOTIFY THE MICROBIOLOGY DEPARTMENT WITHIN ONE WEEK IF SPECIATION AND SENSITIVITIES ARE REQUIRED. Performed at [facility], [address], [place], [place] [address]    Report Status 01/17/2019 FINAL  Final  Blood Culture ID Panel (Reflexed)     Status: Abnormal   Collection Time: 01/15/19  3:00 PM  Result Value Ref Range Status   Enterococcus species NOT DETECTED NOT DETECTED Final   Listeria monocytogenes NOT DETECTED NOT DETECTED Final   Staphylococcus species DETECTED (A) NOT DETECTED Final    Comment: Methicillin (oxacillin) susceptible coagulase negative staphylococcus. Possible blood culture contaminant (unless isolated from more than one blood culture draw or clinical case suggests pathogenicity). No antibiotic treatment is indicated for blood  culture  contaminants. CRITICAL RESULT CALLED TO, READ BACK BY AND VERIFIED WITH: Lois Huxley PharmD 15:10 01/16/19 ([email or web address])    Staphylococcus aureus (BCID) NOT DETECTED NOT DETECTED Final   Methicillin resistance NOT DETECTED NOT DETECTED Final   Streptococcus species NOT DETECTED NOT DETECTED Final   Streptococcus agalactiae NOT DETECTED NOT DETECTED Final   Streptococcus pneumoniae NOT DETECTED NOT DETECTED Final   Streptococcus pyogenes NOT DETECTED NOT DETECTED Final   Acinetobacter baumannii NOT DETECTED NOT DETECTED Final   Enterobacteriaceae species NOT DETECTED NOT DETECTED Final   Enterobacter cloacae complex NOT DETECTED NOT DETECTED Final   Escherichia coli NOT DETECTED NOT DETECTED Final   Klebsiella oxytoca NOT DETECTED NOT DETECTED Final   Klebsiella pneumoniae NOT DETECTED NOT DETECTED Final   Proteus species NOT DETECTED NOT DETECTED Final   Serratia marcescens NOT DETECTED NOT DETECTED Final   Haemophilus influenzae NOT DETECTED NOT DETECTED Final   Neisseria meningitidis NOT DETECTED NOT DETECTED Final   Pseudomonas aeruginosa NOT DETECTED NOT DETECTED Final   Candida albicans NOT DETECTED NOT DETECTED Final   Candida glabrata NOT DETECTED NOT DETECTED Final   Candida krusei NOT DETECTED NOT DETECTED Final   Candida parapsilosis NOT DETECTED NOT DETECTED Final   Candida tropicalis NOT DETECTED NOT DETECTED Final    Comment: Performed at [facility], [address] [place], [place] [address]  Blood Culture (routine x 2)     Status: None   Collection Time: 01/15/19  7:50 PM   Specimen: BLOOD RIGHT HAND  Result Value Ref Range Status   Specimen Description BLOOD RIGHT HAND  Final   Special Requests   Final    BOTTLES DRAWN AEROBIC AND ANAEROBIC Blood Culture results may not be optimal due to an inadequate volume of blood received in culture bottles   Culture   Final    NO GROWTH 5 DAYS Performed at [facility], [address], [place], [place] [address]     Report Status 01/20/2019 FINAL  Final    Radiology Reports Ct Angio Chest Pe W And/or Wo Contrast  Result Date: 01/15/2019 CLINICAL DATA:  EMS call for CP and SHOB x 1 week . Pt reported Pain increased with movement. Pt has Dx of A-fib and a ablation the patient . Pt given 324 ASA and one NGT SL . BP 120/70 prior to meds and BP 74/40 after meds. On arrival EMS reported BP 120/70. Pt reports relief of CP  From meds. HR 80, CBG 137, 97.2 temp. EXAM: CT ANGIOGRAPHY CHEST CT ABDOMEN AND PELVIS WITH CONTRAST TECHNIQUE: Multidetector CT imaging of the chest was performed using the standard protocol during bolus administration of intravenous contrast. Multiplanar CT image reconstructions and MIPs were obtained to evaluate the vascular anatomy. Multidetector CT imaging of the abdomen and pelvis was performed using the standard protocol during bolus administration of intravenous contrast. CONTRAST:  100mL OMNIPAQUE IOHEXOL 350 MG/ML SOLN COMPARISON:  None. FINDINGS: CTA CHEST FINDINGS Cardiovascular: There is satisfactory opacification of the pulmonary arteries to the segmental level. There is no evidence of a pulmonary embolism. Heart is normal in size. No pericardial effusion. Minimal three-vessel coronary artery calcifications. Great vessels are normal in caliber. Mild aortic atherosclerosis. No dissection. Mediastinum/Nodes: Large hiatal hernia, lying to the right of the lower thoracic spine. Esophagus unremarkable. Trachea widely patent. Normal thyroid. No neck base, axillary, mediastinal or hilar masses or enlarged lymph nodes. Lungs/Pleura: Bilateral, peripheral ground-glass airspace opacities. Slight upper lobe predominance. No lung masses or suspicious nodules. No interstitial thickening. No pleural effusion or pneumothorax. Musculoskeletal: No fracture or acute finding. No osteoblastic or osteolytic lesions. Review of the MIP images confirms the above findings. CT ABDOMEN and PELVIS FINDINGS Hepatobiliary:  Decreased attenuation of the liver consistent with fatty infiltration. No liver mass or focal lesion. Liver normal in size. Normal gallbladder. No bile duct dilation. Pancreas: Unremarkable. No pancreatic ductal dilatation or surrounding inflammatory changes. Spleen: Normal in size without focal abnormality. Adrenals/Urinary Tract: Adrenal glands are unremarkable. Kidneys are normal, without renal calculi, focal lesion, or hydronephrosis. Bladder is unremarkable. Stomach/Bowel: Stomach unremarkable other than the large hiatal hernia. Small bowel and colon are normal in caliber. No wall thickening. No inflammation. There are colonic diverticula mostly along the left colon. No diverticulitis. Normal appendix visualized. Vascular/Lymphatic: Mild aortic atherosclerosis.  No aneurysm. No enlarged lymph nodes. Reproductive: Small partly calcified uterine fibroids. Uterus normal in overall size. No ovarian/adnexal masses. Other: No abdominal wall hernia or abnormality. No abdominopelvic ascites. Musculoskeletal: No fracture or acute finding. No osteoblastic or osteolytic lesions. Review of the MIP images confirms the above findings. IMPRESSION: CTA CHEST 1. No evidence of a pulmonary embolism. 2. Bilateral peripheral ground-glass airspace lung opacities with a slight upper lobe predominance. Findings consistent with multifocal infection. Consider atypical infection including viral infection. Noninfectious lung inflammation may also have this appearance. 3. No other acute findings in the chest. 4. Large hiatal hernia. CT ABDOMEN AND PELVIS 1. No acute findings within the abdomen or pelvis. 2. Hepatic steatosis. 3. Aortic atherosclerosis. 4. Scattered colonic diverticula without diverticulitis. Electronically Signed   By: Amie Portlandavid  Ormond M.D.   On: 01/15/2019 12:01   Ct Abdomen Pelvis W Contrast  Result Date: 01/15/2019 CLINICAL DATA:  EMS call for CP and SHOB x 1 week . Pt reported Pain increased with movement. Pt has Dx  of A-fib and a ablation the patient . Pt given 324 ASA and one NGT SL . BP 120/70 prior to meds and BP 74/40 after meds. On arrival EMS reported BP 120/70. Pt reports relief of CP From meds. HR 80, CBG 137, 97.2 temp. EXAM: CT ANGIOGRAPHY CHEST CT ABDOMEN AND PELVIS WITH CONTRAST TECHNIQUE: Multidetector CT imaging of the chest was performed using the standard protocol during bolus administration of intravenous contrast. Multiplanar CT image reconstructions and MIPs were obtained to evaluate the vascular anatomy. Multidetector CT imaging of the abdomen and pelvis was performed using the standard protocol during bolus administration of intravenous contrast.  CONTRAST:  157mL OMNIPAQUE IOHEXOL 350 MG/ML SOLN COMPARISON:  None. FINDINGS: CTA CHEST FINDINGS Cardiovascular: There is satisfactory opacification of the pulmonary arteries to the segmental level. There is no evidence of a pulmonary embolism. Heart is normal in size. No pericardial effusion. Minimal three-vessel coronary artery calcifications. Great vessels are normal in caliber. Mild aortic atherosclerosis. No dissection. Mediastinum/Nodes: Large hiatal hernia, lying to the right of the lower thoracic spine. Esophagus unremarkable. Trachea widely patent. Normal thyroid. No neck base, axillary, mediastinal or hilar masses or enlarged lymph nodes. Lungs/Pleura: Bilateral, peripheral ground-glass airspace opacities. Slight upper lobe predominance. No lung masses or suspicious nodules. No interstitial thickening. No pleural effusion or pneumothorax. Musculoskeletal: No fracture or acute finding. No osteoblastic or osteolytic lesions. Review of the MIP images confirms the above findings. CT ABDOMEN and PELVIS FINDINGS Hepatobiliary: Decreased attenuation of the liver consistent with fatty infiltration. No liver mass or focal lesion. Liver normal in size. Normal gallbladder. No bile duct dilation. Pancreas: Unremarkable. No pancreatic ductal dilatation or  surrounding inflammatory changes. Spleen: Normal in size without focal abnormality. Adrenals/Urinary Tract: Adrenal glands are unremarkable. Kidneys are normal, without renal calculi, focal lesion, or hydronephrosis. Bladder is unremarkable. Stomach/Bowel: Stomach unremarkable other than the large hiatal hernia. Small bowel and colon are normal in caliber. No wall thickening. No inflammation. There are colonic diverticula mostly along the left colon. No diverticulitis. Normal appendix visualized. Vascular/Lymphatic: Mild aortic atherosclerosis.  No aneurysm. No enlarged lymph nodes. Reproductive: Small partly calcified uterine fibroids. Uterus normal in overall size. No ovarian/adnexal masses. Other: No abdominal wall hernia or abnormality. No abdominopelvic ascites. Musculoskeletal: No fracture or acute finding. No osteoblastic or osteolytic lesions. Review of the MIP images confirms the above findings. IMPRESSION: CTA CHEST 1. No evidence of a pulmonary embolism. 2. Bilateral peripheral ground-glass airspace lung opacities with a slight upper lobe predominance. Findings consistent with multifocal infection. Consider atypical infection including viral infection. Noninfectious lung inflammation may also have this appearance. 3. No other acute findings in the chest. 4. Large hiatal hernia. CT ABDOMEN AND PELVIS 1. No acute findings within the abdomen or pelvis. 2. Hepatic steatosis. 3. Aortic atherosclerosis. 4. Scattered colonic diverticula without diverticulitis. Electronically Signed   By: Lajean Manes M.D.   On: 01/15/2019 12:01   Dg Chest Port 1 View  Result Date: 01/15/2019 CLINICAL DATA:  Fall 7/25, chest pain/SOB since then. Hx of stroke, lupus, hypertension, diabetes, coronary artery disease, asthma, afib. Nonsmoker. EXAM: PORTABLE CHEST 1 VIEW COMPARISON:  None. FINDINGS: Cardiac silhouette is mildly enlarged. No mediastinal or hilar masses. Subtle hazy airspace opacities are noted in the peripheral  left mid and lower lung, possibly in the peripheral right mid lung. There are mildly prominent bronchovascular markings diffusely. No convincing pleural effusion.  No pneumothorax. Skeletal structures are grossly intact. IMPRESSION: 1. Subtle hazy airspace opacities in the left mid and lower lung and possibly right peripheral mid lung. Consider multifocal pneumonia if there are consistent clinical findings. Electronically Signed   By: Lajean Manes M.D.   On: 01/15/2019 10:26   Dg Chest Port 1v Same Day  Result Date: 01/18/2019 CLINICAL DATA:  Shortness of breath EXAM: PORTABLE CHEST 1 VIEW COMPARISON:  January 15, 2019 FINDINGS: The study is extremely limited due to positioning. Patchy airspace opacities seen throughout both lungs, left greater than right. The cardiomediastinal silhouette is obscured. IMPRESSION: Extremely limited exam due to patient positioning. Airspace opacities throughout both lungs. Electronically Signed   By: Prudencio Pair M.D.   On:  01/18/2019 11:10  ° ° °

## 2019-01-21 NOTE — Plan of Care (Signed)
Patient continues on 12L oxygen via HFNC, and is alert and responsive with no distress noted.  Tolerated medications with no adverse reactions noted.  No complain verbalized throughout the shift.  Bed in lowest position, callbell within reach and continue to monitor for safety.  Problem: Coping: Goal: Psychosocial and spiritual needs will be supported Outcome: Progressing   Problem: Respiratory: Goal: Will maintain a patent airway Outcome: Progressing

## 2019-01-21 NOTE — Progress Notes (Signed)
Pt. HR decreased to 32 while asleep and then increased to the 50s while asleep.  MD page.

## 2019-01-22 LAB — GLUCOSE, CAPILLARY
Glucose-Capillary: 300 mg/dL — ABNORMAL HIGH (ref 70–99)
Glucose-Capillary: 335 mg/dL — ABNORMAL HIGH (ref 70–99)
Glucose-Capillary: 241 mg/dL — ABNORMAL HIGH (ref 70–99)
Glucose-Capillary: 292 mg/dL — ABNORMAL HIGH (ref 70–99)

## 2019-01-22 LAB — CBC
HCT: 40.7 % (ref 36.0–46.0)
Hemoglobin: 13.5 g/dL (ref 12.0–15.0)
MCH: 28.5 pg (ref 26.0–34.0)
MCHC: 33.2 g/dL (ref 30.0–36.0)
MCV: 85.9 fL (ref 80.0–100.0)
Platelets: 251 10*3/uL (ref 150–400)
RBC: 4.74 MIL/uL (ref 3.87–5.11)
RDW: 17 % — ABNORMAL HIGH (ref 11.5–15.5)
WBC: 9.6 10*3/uL (ref 4.0–10.5)
nRBC: 0 % (ref 0.0–0.2)

## 2019-01-22 LAB — D-DIMER, QUANTITATIVE: D-Dimer, Quant: 10.92 ug/mL-FEU — ABNORMAL HIGH (ref 0.00–0.50)

## 2019-01-22 LAB — COMPREHENSIVE METABOLIC PANEL
ALT: 25 U/L (ref 0–44)
AST: 26 U/L (ref 15–41)
Albumin: 2.3 g/dL — ABNORMAL LOW (ref 3.5–5.0)
Alkaline Phosphatase: 66 U/L (ref 38–126)
Anion gap: 10 (ref 5–15)
BUN: 32 mg/dL — ABNORMAL HIGH (ref 8–23)
CO2: 28 mmol/L (ref 22–32)
Calcium: 8 mg/dL — ABNORMAL LOW (ref 8.9–10.3)
Chloride: 96 mmol/L — ABNORMAL LOW (ref 98–111)
Creatinine, Ser: 0.99 mg/dL (ref 0.44–1.00)
GFR calc Af Amer: >60 mL/min (ref >60)
GFR calc non Af Amer: >60 mL/min (ref >60)
Glucose, Bld: 307 mg/dL — ABNORMAL HIGH (ref 70–99)
Potassium: 4.6 mmol/L (ref 3.5–5.1)
Sodium: 134 mmol/L — ABNORMAL LOW (ref 135–145)
Total Bilirubin: 0.5 mg/dL (ref 0.3–1.2)
Total Protein: 6.7 g/dL (ref 6.5–8.1)

## 2019-01-22 LAB — FERRITIN: Ferritin: 154 ng/mL (ref 11–307)

## 2019-01-22 LAB — C-REACTIVE PROTEIN: CRP: 2.2 mg/dL — ABNORMAL HIGH (ref <1.0)

## 2019-01-22 MED ORDER — SALINE SPRAY 0.65 % NA SOLN
1.0000 | NASAL | Status: DC | PRN
  Filled 2019-01-22: qty 44

## 2019-01-22 MED ORDER — AMIODARONE HCL 100 MG PO TABS
200.0000 mg | ORAL_TABLET | Freq: Every day | ORAL | Status: DC
  Administered 2019-01-23: 200 mg via ORAL
  Filled 2019-01-22: qty 2

## 2019-01-22 MED ORDER — FUROSEMIDE 10 MG/ML IJ SOLN
20.0000 mg | Freq: Once | INTRAMUSCULAR | Status: AC
  Administered 2019-01-22: 20 mg via INTRAVENOUS
  Filled 2019-01-22: qty 2

## 2019-01-22 NOTE — Progress Notes (Signed)
Attempted to call the Pt's daughter, no answer, advised all was ok, and someone would try and call this evening.

## 2019-01-22 NOTE — Progress Notes (Signed)
Received call from Mr. Willene Hatchet of [facility] [place] [place] to get update on patient status as far as potential discharge. Advised Mr. Stacey Burns that at this point she is still considered not stable for discharge. He was appreciative of the update.

## 2019-01-22 NOTE — Evaluation (Signed)
Occupational Therapy Evaluation Patient Details Name: Stacey Burns MRN: [id number] DOB: 07/04/1955 Today's Date: 01/22/2019    History of Present Illness Patient is a 63 y.o. female with PMHx of lupus , DM, HTN, A. fib who presented 8/2  with 1 week history of abdominal cramps, chest discomfort and progressive shortness of breath-she was found to have acute hypoxic respiratory failure in the setting of COVID-19 pneumonia.  Hospital course complicated by worsening hypoxemia requiring 15 L high flow oxygen.   Clinical Impression   This 63 y/o female presents with the above. PTA pt reports independence with ADL, iADL and functional mobility. Pt presenting with decreased activity tolerance and generalized weakness. Pt completing room level mobility this session using RW and overall minguard assist. Pt initially on 6L O2 with SpO2 decreasing to 85/86% with activity, increased to 8L for remaining activity with sats maintaining in high 80s range. Returned to 7L HFNC end of session with O2 sats >90%. Pt currently requires minA for LB ADL, setup assist for seated UB ADL. She will benefit from continued acute OT services and [facility] to maximize her safety and independence with ADL and mobility. Will follow.      Follow Up Recommendations  Home health OT;Supervision/Assistance - 24 hour    Equipment Recommendations  3 in 1 bedside commode;Tub/shower seat(3:1 vs tub/shower seat)           Precautions / Restrictions Precautions Precautions: Fall Precaution Comments: on HFNC, desats Restrictions Weight Bearing Restrictions: No      Mobility Bed Mobility               General bed mobility comments: seated EOB upon arrival with NT  Transfers Overall transfer level: Needs assistance Equipment used: None;Rolling walker (2 wheeled) Transfers: Sit to/from [employer] CorporationStand;Stand Pivot Transfers Sit to Stand: Supervision Stand pivot transfers:  Supervision       General transfer comment: pt using RW for mobility in the room; supervision for stand pivot EOB to recliner during this session    Balance Overall balance assessment: Needs assistance   Sitting balance-Leahy Scale: Good     Standing balance support: No upper extremity supported;During functional activity Standing balance-Leahy Scale: Fair                             ADL either performed or assessed with clinical judgement   ADL Overall ADL's : Needs assistance/impaired Eating/Feeding: Independent;Sitting   Grooming: Wash/dry hands;Set up;Sitting   Upper Body Bathing: Set up;Sitting   Lower Body Bathing: Minimal assistance;Sit to/from stand   Upper Body Dressing : Set up;Sitting   Lower Body Dressing: Minimal assistance;Sit to/from stand   Toilet Transfer: Min guard;Ambulation;Regular Toilet;RW;Grab bars   Toileting- Clothing Manipulation and Hygiene: Min guard;Sit to/from stand;Sitting/lateral lean Toileting - Clothing Manipulation Details (indicate cue type and reason): pt performing peri-care via lateral leans; minguard for standing balance     Functional mobility during ADLs: Min guard;Rolling walker General ADL Comments: pt ambulated to bathroom for toileting task; returned to EOB for seated rest and then transitioned to recliner; initially planned to perform additional functional mobility in room after seated rest however pt's family facetiming/calling and pt preferring to chat with family at this time; min cues provided for deep breathing during seated rest and initiated education of energy conservation techniques  Pertinent Vitals/Pain Pain Assessment: No/denies pain     Hand Dominance Right   Extremity/Trunk Assessment Upper Extremity Assessment Upper Extremity Assessment: Generalized weakness   Lower Extremity Assessment Lower Extremity Assessment: Defer to PT evaluation   Cervical / Trunk  Assessment Cervical / Trunk Assessment: Normal   Communication Communication Communication: No difficulties   Cognition Arousal/Alertness: Awake/alert Behavior During Therapy: WFL for tasks assessed/performed Overall Cognitive Status: Within Functional Limits for tasks assessed                                 General Comments: pt can get anxious at times   General Comments       Exercises     Shoulder Instructions      Home Living Family/patient expects to be discharged to:: Private residence Living Arrangements: Children Available Help at Discharge: Family;Available 24 hours/day Type of Home: House       Home Layout: Two level Alternate Level Stairs-Number of Steps: 7 Alternate Level Stairs-Rails: Left Bathroom Shower/Tub: Teacher, early years/pre: Standard     Home Equipment: None          Prior Functioning/Environment Level of Independence: Independent                 OT Problem List: Decreased strength;Decreased range of motion;Decreased activity tolerance;Impaired balance (sitting and/or standing);Obesity;Cardiopulmonary status limiting activity;Decreased knowledge of use of DME or AE      OT Treatment/Interventions: Self-care/ADL training;Therapeutic exercise;Energy conservation;DME and/or AE instruction;Therapeutic activities;Visual/perceptual remediation/compensation;Patient/family education;Balance training    OT Goals(Current goals can be found in the care plan section) Acute Rehab OT Goals Patient Stated Goal: to go to the homeplace where ther are no steps OT Goal Formulation: With patient Time For Goal Achievement: 02/05/19 Potential to Achieve Goals: Good  OT Frequency: Min 3X/week   Barriers to D/C:            Co-evaluation              AM-PAC OT "6 Clicks" Daily Activity     Outcome Measure Help from another person eating meals?: None Help from another person taking care of personal grooming?:  None Help from another person toileting, which includes using toliet, bedpan, or urinal?: A Little Help from another person bathing (including washing, rinsing, drying)?: A Little Help from another person to put on and taking off regular upper body clothing?: None Help from another person to put on and taking off regular lower body clothing?: A Little 6 Click Score: 21   End of Session Equipment Utilized During Treatment: Rolling walker;Oxygen(8L) Nurse Communication: Mobility status  Activity Tolerance: Patient tolerated treatment well Patient left: in chair;with call bell/phone within reach  OT Visit Diagnosis: Unsteadiness on feet (R26.81);Muscle weakness (generalized) (M62.81)(decreased activity tolerance)                Time: 1120-1200 OT Time Calculation (min): 40 min Charges:  OT General Charges $OT Visit: 1 Visit OT Evaluation $OT Eval Moderate Complexity: 1 Mod OT Treatments $Self Care/Home Management : 23-37 mins  Lou Cal, OT [facility] Pager [phone] Office [phone]   Raymondo Band 01/22/2019, 1:55 PM

## 2019-01-22 NOTE — Plan of Care (Signed)

## 2019-01-22 NOTE — Progress Notes (Signed)
Received call from [facility], patient going in and out of a-flutter with some blocked pac's. Patient has hx of a-fib/a-flutter. Text message sent to on-call provider.  Md responded, no new orders at this time, we are to notify if she sustains a rate above 130

## 2019-01-22 NOTE — Progress Notes (Signed)
Received a call from Mr Lind Covert, case manager at [facility] requesting that since the patient is in custody of [facility] we must reach out to [facility] verify that Pt is still here with [place] [facility] or the [employer] We should also do this every shift to avoid confusion, and notify them if she is to be discharged home. [phone]

## 2019-01-22 NOTE — Progress Notes (Addendum)
PROGRESS NOTE                                                                                                                                                                                                             Patient Demographics:    Stacey Burns, is a 63 y.o. female, DOB - 1955-07-04, ZOX:[id number]  Outpatient Primary MD for the patient is Tollie Eth   Admit date - 01/15/2019   LOS - 6  Chief Complaint  Patient presents with   Chest Pain       Brief Narrative: Patient is a 63 y.o. female with PMHx of lupus on Imuran, DM, HTN, A. fib on Eliquis who presented with 1 week history of abdominal cramps, chest discomfort and progressive shortness of breath-she was found to have acute hypoxic respiratory failure in the setting of COVID-19 pneumonia.  Hospital course complicated by worsening hypoxemia requiring 15 L high flow oxygen.   Subjective:    Stacey Burns feels little better-she has been titrated down to 8 L of oxygen this morning (she was on 15 L of oxygen approximately 3 days back)   Assessment  & Plan :   Acute Hypoxic Resp Failure due to Covid 19 Viral pneumonia: Slowly improving-oxygen requirements are slowly coming down-she is down to 8 L of oxygen this morning (was on 10 L yesterday).  She has completed a course of Remdesivir-she is s/p Actemra on 8/6, she remains on IV steroids.  Continue IV Lasix-in an attempt to keep in negative balance-although she has no signs of volume overload.  Continue full supportive care-continue encouragement to use incentive spirometry, continue to encourage prone positioning for at least 16 hours a day.      COVID-19 Labs: Recent Labs    01/20/19 0142 01/21/19 0155 01/22/19 0148  DDIMER 1.70* 4.36* 10.92*  FERRITIN 165 241 154  CRP 6.2* 2.8* 2.2*    COVID-19 Medications: Steroids: Decadron 8/3-8/5, Solu-Medrol 8/5>> Remdesivir:  8/3>>8/7 Actemra: 8/6x1 Convalescent Plasma: N/A Research Studies: N/A  A. fib/atrial fibrillation with RVR with spells of bradycardia: Remains in sinus rhythm with episodes of brief bradycardia with nonconducted PACs overnight.  Change amiodarone to daily dosing-continue to monitor off beta-blockers.   Case has been discussed over the past few days with cardiology-Dr. Eldridge Dace over the phone.  Chronic diastolic heart  failure: Compensated-continue Lasix in an attempt to keep patient in negative balance.  Continue to follow weights, electrolytes, intake and output.    HTN: Controlled-monitor off antihypertensives  Insulin-dependent DM-2: CBG still remains uncontrolled-we will get her first dose of Lantus 28 units this morning,-continue with 8 units of NovoLog with meals, continue SSI resistant scale.  Follow and adjust accordingly.    Lupus: Appears to be stable-apparently was diagnosed with pancolitis in 2015-continue to hold Imuran-see above regarding discussion about Actemra.  COPD/asthma: No wheezing-appears stable-continue as needed bronchodilators      OSA: Not on CPAP due to COVID-on high flow oxygen  Coagulase-negative Staphylococcus bacteremia times 1 out of 2 cultures: Likely contaminant-no indication of infection  Obesity: Estimated body mass index is 46.05 kg/m as calculated from the following:   Height as of this encounter: 5\' 4"  (1.626 m).   Weight as of this encounter: 121.7 kg.   ABG:    Component Value Date/Time   PHART 7.498 (H) 01/18/2019 1018   PCO2ART 33.5 01/18/2019 1018   PO2ART 46.0 (L) 01/18/2019 1018   HCO3 26.0 01/18/2019 1018   TCO2 27 01/18/2019 1018   O2SAT 86.0 01/18/2019 1018      Condition -guarded-slowly improving over the past few days  Family Communication  : Brother updated over the phone on 8/9  Code Status :  Full Code  Diet :  Diet Order            Diet Carb Modified Fluid consistency: Thin; Room service appropriate? Yes  Diet  effective now               Disposition Plan  :  Remain hospitalized-not stable for discharge  Consults  :  None  Procedures  :  None  GI prophylaxis: PPI  DVT Prophylaxis  : Eliquis  Lab Results  Component Value Date   PLT 251 01/22/2019    Antibiotics  :    Anti-infectives (From admission, onward)   Start     Dose/Rate Route Frequency Ordered Stop   01/17/19 1700  remdesivir 100 mg in sodium chloride 0.9 % 250 mL IVPB     100 mg 500 mL/hr over 30 Minutes Intravenous Every 24 hours 01/16/19 1617 01/20/19 1745   01/16/19 1700  remdesivir 200 mg in sodium chloride 0.9 % 250 mL IVPB     200 mg 500 mL/hr over 30 Minutes Intravenous Once 01/16/19 1617 01/16/19 1824      Inpatient Medications  Scheduled Meds:  amiodarone  200 mg Oral BID   apixaban  5 mg Oral BID   baclofen  10 mg Oral QHS   furosemide  40 mg Intravenous Daily   insulin aspart  0-20 Units Subcutaneous TID WC   insulin aspart  0-5 Units Subcutaneous QHS   insulin aspart  8 Units Subcutaneous TID WC   insulin glargine  28 Units Subcutaneous Daily   methylPREDNISolone (SOLU-MEDROL) injection  40 mg Intravenous Q8H   pantoprazole  40 mg Oral Daily   sodium chloride flush  10-40 mL Intracatheter Q12H   sodium chloride flush  3 mL Intravenous Q12H   umeclidinium bromide  1 puff Inhalation Daily   Continuous Infusions:  PRN Meds:.acetaminophen, ALPRAZolam, chlorpheniramine-HYDROcodone, diphenhydrAMINE, guaiFENesin-dextromethorphan, levalbuterol, lip balm, ondansetron **OR** ondansetron (ZOFRAN) IV, oxyCODONE, polyethylene glycol, sodium chloride, sodium chloride flush, zolpidem   Time Spent in minutes  35  See all Orders from today for further details   Jeoffrey MassedShanker Kharma Sampsel M.D on 01/22/2019 at 11:01 AM  To page go to [email or web address] - use universal password  [facility] -  Office  [phone]    Objective:   Vitals:   01/21/19 2010 01/22/19 0431 01/22/19 0500 01/22/19 1000  BP:  91/72 (!) 150/79  (!) 129/40  Pulse: 76   (!) 57  Resp: (!) 29   (!) 28  Temp: (!) 97.5 F (36.4 C)   98.1 F (36.7 C)  TempSrc: Axillary     SpO2: (!) 87%   91%  Weight:   121.7 kg   Height:        Wt Readings from Last 3 Encounters:  01/22/19 121.7 kg  07/27/18 119.3 kg     Intake/Output Summary (Last 24 hours) at 01/22/2019 1101 Last data filed at 01/22/2019 1030 Gross per 24 hour  Intake 733 ml  Output 1300 ml  Net -567 ml     Physical Exam  Gen Exam:Alert awake-not in any distress HEENT:atraumatic, normocephalic Chest: B/L rales CVS:S1S2 regular Abdomen:soft non tender, non distended Extremities:trace edema Neurology: Non focal Skin: no rash   Data Review:    CBC Recent Labs  Lab 01/17/19 0225 01/18/19 0155 01/18/19 1018 01/19/19 0215 01/20/19 0142 01/21/19 0155 01/22/19 0148  WBC 8.4 8.7  --  8.0 7.3 9.1 9.6  HGB 12.6 12.8 13.3 13.7 13.6 13.9 13.5  HCT 38.7 39.0 39.0 41.6 40.9 41.1 40.7  PLT 215 274  --  326 348 299 251  MCV 88.8 87.6  --  86.7 85.6 85.3 85.9  MCH 28.9 28.8  --  28.5 28.5 28.8 28.5  MCHC 32.6 32.8  --  32.9 33.3 33.8 33.2  RDW 17.2* 17.2*  --  17.0* 16.9* 16.7* 17.0*  LYMPHSABS 0.3* 0.6*  --  0.5* 0.4*  --   --   MONOABS 0.3 0.4  --  0.3 0.4  --   --   EOSABS 0.0 0.0  --  0.0 0.0  --   --   BASOSABS 0.0 0.0  --  0.0 0.0  --   --     Chemistries  Recent Labs  Lab 01/17/19 0225 01/18/19 0155 01/18/19 1018 01/19/19 0215 01/20/19 0142 01/21/19 0155 01/22/19 0148  NA 138 138 139 139 137 135 134*  K 4.7 4.7 4.2 4.3 4.3 4.7 4.6  CL 103 103  --  100 99 97* 96*  CO2 27 30  --  28 29 24 28   GLUCOSE 196* 216*  --  218* 226* 309* 307*  BUN 12 20  --  23 23 26* 32*  CREATININE 0.75 0.70  --  0.79 0.91 0.92 0.99  CALCIUM 8.3* 8.2*  --  8.3* 8.1* 8.0* 8.0*  MG 2.1 2.2  --   --   --   --   --   AST 34 25  --  36 28 27 26   ALT 17 18  --  22 21 22 25   ALKPHOS 38 41  --  50 50 61 66  BILITOT 0.3 0.4  --  0.5 0.6 0.4 0.5    ------------------------------------------------------------------------------------------------------------------ No results for input(s): CHOL, HDL, LDLCALC, TRIG, CHOLHDL, LDLDIRECT in the last 72 hours.  Lab Results  Component Value Date   HGBA1C 7.1 (H) 01/15/2019   ------------------------------------------------------------------------------------------------------------------ No results for input(s): TSH, T4TOTAL, T3FREE, THYROIDAB in the last 72 hours.  Invalid input(s): FREET3 ------------------------------------------------------------------------------------------------------------------ Recent Labs    01/21/19 0155 01/22/19 0148  FERRITIN 241 154    Coagulation profile No results for input(s): INR, PROTIME in the  last 168 hours.  Recent Labs    01/21/19 0155 01/22/19 0148  DDIMER 4.36* 10.92*    Cardiac Enzymes No results for input(s): CKMB, TROPONINI, MYOGLOBIN in the last 168 hours.  Invalid input(s): CK ------------------------------------------------------------------------------------------------------------------    Component Value Date/Time   BNP 127.1 (H) 01/19/2019 0215    Micro Results Recent Results (from the past 240 hour(s))  SARS Coronavirus 2 [facility] order, Performed in [facility] hospital lab) Nasopharyngeal Nasopharyngeal Swab     Status: Abnormal   Collection Time: 01/15/19 10:35 AM   Specimen: Nasopharyngeal Swab  Result Value Ref Range Status   SARS Coronavirus 2 POSITIVE (A) NEGATIVE Final    Comment: CRITICAL RESULT CALLED TO, READ BACK BY AND VERIFIED WITH: RN ELIZABETH TAYLOR [phone] FCP (NOTE) If result is NEGATIVE SARS-CoV-2 target nucleic acids are NOT DETECTED. The SARS-CoV-2 RNA is generally detectable in upper and lower  respiratory specimens during the acute phase of infection. The lowest  concentration of SARS-CoV-2 viral copies this assay can detect is 250  copies / mL. A negative result does not preclude  SARS-CoV-2 infection  and should not be used as the sole basis for treatment or other  patient management decisions.  A negative result may occur with  improper specimen collection / handling, submission of specimen other  than nasopharyngeal swab, presence of viral mutation(s) within the  areas targeted by this assay, and inadequate number of viral copies  (<250 copies / mL). A negative result must be combined with clinical  observations, patient history, and epidemiological information. If result is POSITIVE SARS-CoV-2 target nucleic acids are DET ECTED. The SARS-CoV-2 RNA is generally detectable in upper and lower  respiratory specimens during the acute phase of infection.  Positive  results are indicative of active infection with SARS-CoV-2.  Clinical  correlation with patient history and other diagnostic information is  necessary to determine patient infection status.  Positive results do  not rule out bacterial infection or co-infection with other viruses. If result is PRESUMPTIVE POSTIVE SARS-CoV-2 nucleic acids MAY BE PRESENT.   A presumptive positive result was obtained on the submitted specimen  and confirmed on repeat testing.  While 2019 novel coronavirus  (SARS-CoV-2) nucleic acids may be present in the submitted sample  additional confirmatory testing may be necessary for epidemiological  and / or clinical management purposes  to differentiate between  SARS-CoV-2 and other Sarbecovirus currently known to infect humans.  If clinically indicated additional testing with an alternate test  methodology ([place]) is advised. The SARS-CoV-2 RNA is generally  detectable in upper and lower respiratory specimens during the acute  phase of infection. The expected result is Negative. Fact Sheet for Patients:  StrictlyIdeas.no Fact Sheet for Healthcare Providers: BankingDealers.co.za This test is not yet approved or cleared by the  [place] FDA and has been authorized for detection and/or diagnosis of SARS-CoV-2 by FDA under an Emergency Use Authorization (EUA).  This EUA will remain in effect (meaning this test can be used) for the duration of the COVID-19 declaration under Section 564(b)(1) of the Act, 21 U.S.C. section 360bbb-3(b)(1), unless the authorization is terminated or revoked sooner. Performed at [facility], [place] [address] [place], [place] [address]   Blood Culture (routine x 2)     Status: Abnormal   Collection Time: 01/15/19  3:00 PM   Specimen: BLOOD  Result Value Ref Range Status   Specimen Description BLOOD RIGHT ANTECUBITAL  Final   Special Requests   Final  BOTTLES DRAWN AEROBIC AND ANAEROBIC Blood Culture adequate volume   Culture  Setup Time   Final    ANAEROBIC BOTTLE ONLY GRAM POSITIVE COCCI IN CLUSTERS CRITICAL RESULT CALLED TO, READ BACK BY AND VERIFIED WITH: Lois Huxley PharmD 15:10 01/16/19 ([email or web address])    Culture (A)  Final    STAPHYLOCOCCUS SPECIES (COAGULASE NEGATIVE) THE SIGNIFICANCE OF ISOLATING THIS ORGANISM FROM A SINGLE SET OF BLOOD CULTURES WHEN MULTIPLE SETS ARE DRAWN IS UNCERTAIN. PLEASE NOTIFY THE MICROBIOLOGY DEPARTMENT WITHIN ONE WEEK IF SPECIATION AND SENSITIVITIES ARE REQUIRED. Performed at [facility], [address] [place], [place] [address]    Report Status 01/17/2019 FINAL  Final  Blood Culture ID Panel (Reflexed)     Status: Abnormal   Collection Time: 01/15/19  3:00 PM  Result Value Ref Range Status   Enterococcus species NOT DETECTED NOT DETECTED Final   Listeria monocytogenes NOT DETECTED NOT DETECTED Final   Staphylococcus species DETECTED (A) NOT DETECTED Final    Comment: Methicillin (oxacillin) susceptible coagulase negative staphylococcus. Possible blood culture contaminant (unless isolated from more than one blood culture draw or clinical case suggests pathogenicity). No antibiotic treatment is indicated for blood  culture  contaminants. CRITICAL RESULT CALLED TO, READ BACK BY AND VERIFIED WITH: Lois Huxley PharmD 15:10 01/16/19 ([email or web address])    Staphylococcus aureus (BCID) NOT DETECTED NOT DETECTED Final   Methicillin resistance NOT DETECTED NOT DETECTED Final   Streptococcus species NOT DETECTED NOT DETECTED Final   Streptococcus agalactiae NOT DETECTED NOT DETECTED Final   Streptococcus pneumoniae NOT DETECTED NOT DETECTED Final   Streptococcus pyogenes NOT DETECTED NOT DETECTED Final   Acinetobacter baumannii NOT DETECTED NOT DETECTED Final   Enterobacteriaceae species NOT DETECTED NOT DETECTED Final   Enterobacter cloacae complex NOT DETECTED NOT DETECTED Final   Escherichia coli NOT DETECTED NOT DETECTED Final   Klebsiella oxytoca NOT DETECTED NOT DETECTED Final   Klebsiella pneumoniae NOT DETECTED NOT DETECTED Final   Proteus species NOT DETECTED NOT DETECTED Final   Serratia marcescens NOT DETECTED NOT DETECTED Final   Haemophilus influenzae NOT DETECTED NOT DETECTED Final   Neisseria meningitidis NOT DETECTED NOT DETECTED Final   Pseudomonas aeruginosa NOT DETECTED NOT DETECTED Final   Candida albicans NOT DETECTED NOT DETECTED Final   Candida glabrata NOT DETECTED NOT DETECTED Final   Candida krusei NOT DETECTED NOT DETECTED Final   Candida parapsilosis NOT DETECTED NOT DETECTED Final   Candida tropicalis NOT DETECTED NOT DETECTED Final    Comment: Performed at [facility], [address] [place], [place] [address]  Blood Culture (routine x 2)     Status: None   Collection Time: 01/15/19  7:50 PM   Specimen: BLOOD RIGHT HAND  Result Value Ref Range Status   Specimen Description BLOOD RIGHT HAND  Final   Special Requests   Final    BOTTLES DRAWN AEROBIC AND ANAEROBIC Blood Culture results may not be optimal due to an inadequate volume of blood received in culture bottles   Culture   Final    NO GROWTH 5 DAYS Performed at [facility], [address], [place], [place] [address]     Report Status 01/20/2019 FINAL  Final    Radiology Reports Ct Angio Chest Pe W And/or Wo Contrast  Result Date: 01/15/2019 CLINICAL DATA:  EMS call for CP and SHOB x 1 week . Pt reported Pain increased with movement. Pt has Dx of A-fib and a ablation the patient . Pt given  324 ASA and one NGT SL . BP 120/70 prior to meds and BP 74/40 after meds. On arrival EMS reported BP 120/70. Pt reports relief of CP From meds. HR 80, CBG 137, 97.2 temp. EXAM: CT ANGIOGRAPHY CHEST CT ABDOMEN AND PELVIS WITH CONTRAST TECHNIQUE: Multidetector CT imaging of the chest was performed using the standard protocol during bolus administration of intravenous contrast. Multiplanar CT image reconstructions and MIPs were obtained to evaluate the vascular anatomy. Multidetector CT imaging of the abdomen and pelvis was performed using the standard protocol during bolus administration of intravenous contrast. CONTRAST:  100mL OMNIPAQUE IOHEXOL 350 MG/ML SOLN COMPARISON:  None. FINDINGS: CTA CHEST FINDINGS Cardiovascular: There is satisfactory opacification of the pulmonary arteries to the segmental level. There is no evidence of a pulmonary embolism. Heart is normal in size. No pericardial effusion. Minimal three-vessel coronary artery calcifications. Great vessels are normal in caliber. Mild aortic atherosclerosis. No dissection. Mediastinum/Nodes: Large hiatal hernia, lying to the right of the lower thoracic spine. Esophagus unremarkable. Trachea widely patent. Normal thyroid. No neck base, axillary, mediastinal or hilar masses or enlarged lymph nodes. Lungs/Pleura: Bilateral, peripheral ground-glass airspace opacities. Slight upper lobe predominance. No lung masses or suspicious nodules. No interstitial thickening. No pleural effusion or pneumothorax. Musculoskeletal: No fracture or acute finding. No osteoblastic or osteolytic lesions. Review of the MIP images confirms the above findings. CT ABDOMEN and PELVIS FINDINGS Hepatobiliary:  Decreased attenuation of the liver consistent with fatty infiltration. No liver mass or focal lesion. Liver normal in size. Normal gallbladder. No bile duct dilation. Pancreas: Unremarkable. No pancreatic ductal dilatation or surrounding inflammatory changes. Spleen: Normal in size without focal abnormality. Adrenals/Urinary Tract: Adrenal glands are unremarkable. Kidneys are normal, without renal calculi, focal lesion, or hydronephrosis. Bladder is unremarkable. Stomach/Bowel: Stomach unremarkable other than the large hiatal hernia. Small bowel and colon are normal in caliber. No wall thickening. No inflammation. There are colonic diverticula mostly along the left colon. No diverticulitis. Normal appendix visualized. Vascular/Lymphatic: Mild aortic atherosclerosis.  No aneurysm. No enlarged lymph nodes. Reproductive: Small partly calcified uterine fibroids. Uterus normal in overall size. No ovarian/adnexal masses. Other: No abdominal wall hernia or abnormality. No abdominopelvic ascites. Musculoskeletal: No fracture or acute finding. No osteoblastic or osteolytic lesions. Review of the MIP images confirms the above findings. IMPRESSION: CTA CHEST 1. No evidence of a pulmonary embolism. 2. Bilateral peripheral ground-glass airspace lung opacities with a slight upper lobe predominance. Findings consistent with multifocal infection. Consider atypical infection including viral infection. Noninfectious lung inflammation may also have this appearance. 3. No other acute findings in the chest. 4. Large hiatal hernia. CT ABDOMEN AND PELVIS 1. No acute findings within the abdomen or pelvis. 2. Hepatic steatosis. 3. Aortic atherosclerosis. 4. Scattered colonic diverticula without diverticulitis. Electronically Signed   By: Amie Portlandavid  Ormond M.D.   On: 01/15/2019 12:01   Ct Abdomen Pelvis W Contrast  Result Date: 01/15/2019 CLINICAL DATA:  EMS call for CP and SHOB x 1 week . Pt reported Pain increased with movement. Pt has Dx  of A-fib and a ablation the patient . Pt given 324 ASA and one NGT SL . BP 120/70 prior to meds and BP 74/40 after meds. On arrival EMS reported BP 120/70. Pt reports relief of CP From meds. HR 80, CBG 137, 97.2 temp. EXAM: CT ANGIOGRAPHY CHEST CT ABDOMEN AND PELVIS WITH CONTRAST TECHNIQUE: Multidetector CT imaging of the chest was performed using the standard protocol during bolus administration of intravenous contrast. Multiplanar CT image reconstructions and  MIPs were obtained to evaluate the vascular anatomy. Multidetector CT imaging of the abdomen and pelvis was performed using the standard protocol during bolus administration of intravenous contrast. CONTRAST:  OMNIPAQUE IOHEXOL 350 MG/ML SOLN COMPARISON:  None. FINDINGS: CTA CHEST FINDINGS Cardiovascular: There is satisfactory opacification of the pulmonary arteries to the segmental level. There is no evidence of a pulmonary embolism. Heart is normal in size. No pericardial effusion. Minimal three-vessel coronary artery calcifications. Great vessels are normal in caliber. Mild aortic atherosclerosis. No dissection. Mediastinum/Nodes: Large hiatal hernia, lying to the right of the lower thoracic spine. Esophagus unremarkable. Trachea widely patent. Normal thyroid. No neck base, axillary, mediastinal or hilar masses or enlarged lymph nodes. Lungs/Pleura: Bilateral, peripheral ground-glass airspace opacities. Slight upper lobe predominance. No lung masses or suspicious nodules. No interstitial thickening. No pleural effusion or pneumothorax. Musculoskeletal: No fracture or acute finding. No osteoblastic or osteolytic lesions. Review of the MIP images confirms the above findings. CT ABDOMEN and PELVIS FINDINGS Hepatobiliary: Decreased attenuation of the liver consistent with fatty infiltration. No liver mass or focal lesion. Liver normal in size. Normal gallbladder. No bile duct dilation. Pancreas: Unremarkable. No pancreatic ductal dilatation or  surrounding inflammatory changes. Spleen: Normal in size without focal abnormality. Adrenals/Urinary Tract: Adrenal glands are unremarkable. Kidneys are normal, without renal calculi, focal lesion, or hydronephrosis. Bladder is unremarkable. Stomach/Bowel: Stomach unremarkable other than the large hiatal hernia. Small bowel and colon are normal in caliber. No wall thickening. No inflammation. There are colonic diverticula mostly along the left colon. No diverticulitis. Normal appendix visualized. Vascular/Lymphatic: Mild aortic atherosclerosis.  No aneurysm. No enlarged lymph nodes. Reproductive: Small partly calcified uterine fibroids. Uterus normal in overall size. No ovarian/adnexal masses. Other: No abdominal wall hernia or abnormality. No abdominopelvic ascites. Musculoskeletal: No fracture or acute finding. No osteoblastic or osteolytic lesions. Review of the MIP images confirms the above findings. IMPRESSION: CTA CHEST 1. No evidence of a pulmonary embolism. 2. Bilateral peripheral ground-glass airspace lung opacities with a slight upper lobe predominance. Findings consistent with multifocal infection. Consider atypical infection including viral infection. Noninfectious lung inflammation may also have this appearance. 3. No other acute findings in the chest. 4. Large hiatal hernia. CT ABDOMEN AND PELVIS 1. No acute findings within the abdomen or pelvis. 2. Hepatic steatosis. 3. Aortic atherosclerosis. 4. Scattered colonic diverticula without diverticulitis. Electronically Signed   By: Amie Portland M.D.   On: 01/15/2019 12:01   Dg Chest Port 1 View  Result Date: 01/15/2019 CLINICAL DATA:  Fall 7/25, chest pain/SOB since then. Hx of stroke, lupus, hypertension, diabetes, coronary artery disease, asthma, afib. Nonsmoker. EXAM: PORTABLE CHEST 1 VIEW COMPARISON:  None. FINDINGS: Cardiac silhouette is mildly enlarged. No mediastinal or hilar masses. Subtle hazy airspace opacities are noted in the peripheral  left mid and lower lung, possibly in the peripheral right mid lung. There are mildly prominent bronchovascular markings diffusely. No convincing pleural effusion.  No pneumothorax. Skeletal structures are grossly intact. IMPRESSION: 1. Subtle hazy airspace opacities in the left mid and lower lung and possibly right peripheral mid lung. Consider multifocal pneumonia if there are consistent clinical findings. Electronically Signed   By: Amie Portland M.D.   On: 01/15/2019 10:26   Dg Chest Port 1v Same Day  Result Date: 01/18/2019 CLINICAL DATA:  Shortness of breath EXAM: PORTABLE CHEST 1 VIEW COMPARISON:  January 15, 2019 FINDINGS: The study is extremely limited due to positioning. Patchy airspace opacities seen throughout both lungs, left greater than right. The cardiomediastinal  silhouette is obscured. IMPRESSION: Extremely limited exam due to patient positioning. Airspace opacities throughout both lungs. Electronically Signed   By: Jonna Clark M.D.   On: 01/18/2019 11:10

## 2019-01-23 LAB — CBC
HCT: 42.1 % (ref 36.0–46.0)
Hemoglobin: 14 g/dL (ref 12.0–15.0)
MCH: 28.5 pg (ref 26.0–34.0)
MCHC: 33.3 g/dL (ref 30.0–36.0)
MCV: 85.7 fL (ref 80.0–100.0)
Platelets: 177 10*3/uL (ref 150–400)
RBC: 4.91 MIL/uL (ref 3.87–5.11)
RDW: 17.2 % — ABNORMAL HIGH (ref 11.5–15.5)
WBC: 9.3 10*3/uL (ref 4.0–10.5)
nRBC: 0 % (ref 0.0–0.2)

## 2019-01-23 LAB — COMPREHENSIVE METABOLIC PANEL
ALT: 27 U/L (ref 0–44)
AST: 25 U/L (ref 15–41)
Albumin: 2.2 g/dL — ABNORMAL LOW (ref 3.5–5.0)
Alkaline Phosphatase: 66 U/L (ref 38–126)
Anion gap: 12 (ref 5–15)
BUN: 32 mg/dL — ABNORMAL HIGH (ref 8–23)
CO2: 25 mmol/L (ref 22–32)
Calcium: 7.7 mg/dL — ABNORMAL LOW (ref 8.9–10.3)
Chloride: 94 mmol/L — ABNORMAL LOW (ref 98–111)
Creatinine, Ser: 0.99 mg/dL (ref 0.44–1.00)
GFR calc Af Amer: >60 mL/min (ref >60)
GFR calc non Af Amer: >60 mL/min (ref >60)
Glucose, Bld: 274 mg/dL — ABNORMAL HIGH (ref 70–99)
Potassium: 4.5 mmol/L (ref 3.5–5.1)
Sodium: 131 mmol/L — ABNORMAL LOW (ref 135–145)
Total Bilirubin: 0.5 mg/dL (ref 0.3–1.2)
Total Protein: 6.1 g/dL — ABNORMAL LOW (ref 6.5–8.1)

## 2019-01-23 LAB — D-DIMER, QUANTITATIVE: D-Dimer, Quant: 9.46 ug/mL-FEU — ABNORMAL HIGH (ref 0.00–0.50)

## 2019-01-23 LAB — GLUCOSE, CAPILLARY
Glucose-Capillary: 296 mg/dL — ABNORMAL HIGH (ref 70–99)
Glucose-Capillary: 468 mg/dL — ABNORMAL HIGH (ref 70–99)
Glucose-Capillary: 374 mg/dL — ABNORMAL HIGH (ref 70–99)
Glucose-Capillary: 255 mg/dL — ABNORMAL HIGH (ref 70–99)
Glucose-Capillary: 221 mg/dL — ABNORMAL HIGH (ref 70–99)
Glucose-Capillary: 265 mg/dL — ABNORMAL HIGH (ref 70–99)

## 2019-01-23 LAB — C-REACTIVE PROTEIN: CRP: 1.1 mg/dL — ABNORMAL HIGH (ref <1.0)

## 2019-01-23 LAB — FERRITIN: Ferritin: 164 ng/mL (ref 11–307)

## 2019-01-23 MED ORDER — INSULIN GLARGINE 100 UNIT/ML ~~LOC~~ SOLN
6.0000 [IU] | Freq: Once | SUBCUTANEOUS | Status: AC
  Administered 2019-01-23: 6 [IU] via SUBCUTANEOUS
  Filled 2019-01-23: qty 0.06

## 2019-01-23 MED ORDER — INSULIN ASPART 100 UNIT/ML ~~LOC~~ SOLN
22.0000 [IU] | Freq: Once | SUBCUTANEOUS | Status: AC
  Administered 2019-01-23: 22 [IU] via SUBCUTANEOUS

## 2019-01-23 MED ORDER — FUROSEMIDE 10 MG/ML IJ SOLN
40.0000 mg | Freq: Two times a day (BID) | INTRAMUSCULAR | Status: DC
  Administered 2019-01-23 – 2019-01-26 (×7): 40 mg via INTRAVENOUS
  Filled 2019-01-23 (×8): qty 4

## 2019-01-23 MED ORDER — ALUM & MAG HYDROXIDE-SIMETH 200-200-20 MG/5ML PO SUSP: 30.0000 mL | Freq: Four times a day (QID) | ORAL | Status: DC | PRN

## 2019-01-23 MED ORDER — METHYLPREDNISOLONE SODIUM SUCC 40 MG IJ SOLR
40.0000 mg | Freq: Two times a day (BID) | INTRAMUSCULAR | Status: DC
  Administered 2019-01-23 – 2019-01-25 (×4): 40 mg via INTRAVENOUS
  Filled 2019-01-23 (×4): qty 1

## 2019-01-23 MED ORDER — INSULIN GLARGINE 100 UNIT/ML ~~LOC~~ SOLN
34.0000 [IU] | Freq: Every day | SUBCUTANEOUS | Status: DC
  Administered 2019-01-24 – 2019-01-27 (×4): 34 [IU] via SUBCUTANEOUS
  Filled 2019-01-23 (×4): qty 0.34

## 2019-01-23 MED ORDER — AMIODARONE HCL 100 MG PO TABS
200.0000 mg | ORAL_TABLET | Freq: Two times a day (BID) | ORAL | Status: DC
  Administered 2019-01-23 – 2019-01-27 (×8): 200 mg via ORAL
  Filled 2019-01-23 (×8): qty 2

## 2019-01-23 MED ORDER — INSULIN GLARGINE 100 UNIT/ML ~~LOC~~ SOLN
6.0000 [IU] | Freq: Once | SUBCUTANEOUS | Status: DC
  Filled 2019-01-23: qty 0.06

## 2019-01-23 NOTE — Progress Notes (Signed)
Phoned the Pt's daughter today, and no answer.  Left message that we would call again tonight or tomorrow for updates.

## 2019-01-23 NOTE — Plan of Care (Signed)

## 2019-01-23 NOTE — Progress Notes (Signed)
Nutrition Follow-up  DOCUMENTATION CODES:   Morbid obesity  INTERVENTION:   Pt receiving Hormel Shake daily with Breakfast which provides 520 kcals and 22 g of protein and Magic cup BID with lunch and dinner, each supplement provides 290 kcal and 9 grams of protein, automatically on meal trays to optimize nutritional intake.   Encourage PO intake  NUTRITION DIAGNOSIS:   Increased nutrient needs related to acute illness(COVID) as evidenced by estimated needs. Ongoing.   GOAL:   Patient will meet greater than or equal to 90% of their needs Progressing.   MONITOR:   PO intake, Supplement acceptance  REASON FOR ASSESSMENT:   Malnutrition Screening Tool    ASSESSMENT:   Pt with PMH of HTN, DM, lupus, CAD, and afib who is with COVID-19 PNA after one week of SOB and abd pain.   Pt lives with daughter and son-in-law.  Pt's O2 requirements down from 8L to 5L via [employer] Meal Completion: 50%  Medications reviewed and include: lasix,34 units lantus daily, solumedrol, SSI, 8 units novolog TID Labs reviewed CBG's: [phone]   NUTRITION - FOCUSED PHYSICAL EXAM:  Deferred   Diet Order:   Diet Order            Diet Carb Modified Fluid consistency: Thin; Room service appropriate? Yes  Diet effective now              EDUCATION NEEDS:   No education needs have been identified at this time  Skin:  Skin Assessment: Reviewed RN Assessment  Last BM:  8/10  Height:   Ht Readings from Last 1 Encounters:  01/15/19 5\' 4"  (1.626 m)    Weight:   Wt Readings from Last 1 Encounters:  01/23/19 120.2 kg    Ideal Body Weight:     BMI:  Body mass index is 45.49 kg/m.  Estimated Nutritional Needs:   Kcal:  2000-2200  Protein:  100-115 grams  Fluid:  > 2L/day  Maylon Peppers RD, [place], [place] Pager [phone] After Hours Pager

## 2019-01-23 NOTE — Progress Notes (Signed)
Pt up on side of bed bathing, when she stood up to clean, she became very SOB and tired.  She was placed back on the Pulse Ox and was in the Mid 70's.  Pt took approximately 5-10 minutes to recover.  Pt was placed in the chair after her bath.

## 2019-01-23 NOTE — Progress Notes (Addendum)
PROGRESS NOTE                                                                                                                                                                                                             Patient Demographics:    Stacey Burns, is a 63 y.o. female, DOB - 28-Feb-1956, ZOX:[id number]  Outpatient Primary MD for the patient is Tollie EthChurch, Shea N, PA-C   Admit date - 01/15/2019   LOS - 7  Chief Complaint  Patient presents with   Chest Pain       Brief Narrative: Patient is a 63 y.o. female with PMHx of lupus on Imuran, DM, HTN, A. fib on Eliquis who presented with 1 week history of abdominal cramps, chest discomfort and progressive shortness of breath-she was found to have acute hypoxic respiratory failure in the setting of COVID-19 pneumonia.  Hospital course complicated by worsening hypoxemia requiring 15 L high flow oxygen.   Subjective:    Stacey Burns feels much better this morning-she was on 7 L of oxygen when I walked in-she was titrated down to 5 L with stable O2 saturations.  Patient feels like a walking/ambulating with physical therapy as she thinks her shortness of breath has markedly improved.   Assessment  & Plan :   Acute Hypoxic Resp Failure due to Covid 19 Viral pneumonia: Improving with decreased oxygen requirements.  Continue IV steroids-we will start tapering in the next day or so.  Patient has completed a course of Remdesivir, she is s/p Actemra on 8/6.    Fever: Afebrile  Oxygen requirement: Down to 5 L via high flow-on approximately 8 L yesterday.  COVID-19 Labs: Recent Labs    01/21/19 0155 01/22/19 0148 01/23/19 0150  DDIMER 4.36* 10.92* 9.46*  FERRITIN 241 154 164  CRP 2.8* 2.2* 1.1*    COVID-19 Medications: Steroids: Decadron 8/3-8/5, Solu-Medrol 8/5>> Remdesivir: 8/3>>8/7 Actemra: 8/6x1 Convalescent Plasma: N/A Research Studies: N/A  Other  medications: Diuretics: Lasix-change to twice daily dosing today. Antibiotics: None  A. fib/atrial fibrillation with RVR with spells of bradycardia: After maintaining sinus rhythm for the past few days-had a brief episode of a flutter overnight, continues to have sinus bradycardia with nonconducted PACs at times.  Spoke with cardiologist-Dr. Rennis GoldenHilty over the phone-chart and telemetry strips reviewed-recommendations are to change back amiodarone to twice  daily dosing.  Continue Eliquis.   Chronic diastolic heart failure: Compensated-has some trace/1+ pitting edema (chronic per patient) continue IV Lasix-we will give twice daily dose today.    HTN: Controlled-monitor off antihypertensives  Insulin-dependent DM-2: CBG slowly improving but still uncontrolled-increase Lantus to 34 units, continue with 8 units of NovoLog with meals, continue resistant SSI.  Follow and adjust.      Lupus: Appears to be stable-apparently was diagnosed with pancolitis in 2015-continue to hold Imuran-see above regarding discussion about Actemra.  COPD/asthma: No wheezing-appears stable-continue as needed bronchodilators      OSA: Not on CPAP due to COVID-on high flow oxygen  Coagulase-negative Staphylococcus bacteremia times 1 out of 2 cultures: Likely contaminant-no indication of infection  Obesity: Estimated body mass index is 45.49 kg/m as calculated from the following:   Height as of this encounter: 5\' 4"  (1.626 m).   Weight as of this encounter: 120.2 kg.   ABG:    Component Value Date/Time   PHART 7.498 (H) 01/18/2019 1018   PCO2ART 33.5 01/18/2019 1018   PO2ART 46.0 (L) 01/18/2019 1018   HCO3 26.0 01/18/2019 1018   TCO2 27 01/18/2019 1018   O2SAT 86.0 01/18/2019 1018      Condition -guarded  Family Communication  : Voicemail left for brother on 8/10  Code Status :  Full Code  Diet :  Diet Order            Diet Carb Modified Fluid consistency: Thin; Room service appropriate? Yes  Diet  effective now               Disposition Plan  :  Remain hospitalized-not stable for discharge-suspect home with home health services later this week.  May require home O2  Consults  :  None  Procedures  :  None  GI prophylaxis: PPI  DVT Prophylaxis  : Eliquis  Lab Results  Component Value Date   PLT 177 01/23/2019    Antibiotics  :    Anti-infectives (From admission, onward)   Start     Dose/Rate Route Frequency Ordered Stop   01/17/19 1700  remdesivir 100 mg in sodium chloride 0.9 % 250 mL IVPB     100 mg 500 mL/hr over 30 Minutes Intravenous Every 24 hours 01/16/19 1617 01/20/19 1745   01/16/19 1700  remdesivir 200 mg in sodium chloride 0.9 % 250 mL IVPB     200 mg 500 mL/hr over 30 Minutes Intravenous Once 01/16/19 1617 01/16/19 1824      Inpatient Medications  Scheduled Meds:  amiodarone  200 mg Oral Daily   apixaban  5 mg Oral BID   baclofen  10 mg Oral QHS   furosemide  40 mg Intravenous Daily   insulin aspart  0-20 Units Subcutaneous TID WC   insulin aspart  0-5 Units Subcutaneous QHS   insulin aspart  8 Units Subcutaneous TID WC   insulin glargine  28 Units Subcutaneous Daily   methylPREDNISolone (SOLU-MEDROL) injection  40 mg Intravenous Q8H   pantoprazole  40 mg Oral Daily   sodium chloride flush  10-40 mL Intracatheter Q12H   sodium chloride flush  3 mL Intravenous Q12H   umeclidinium bromide  1 puff Inhalation Daily   Continuous Infusions:  PRN Meds:.acetaminophen, ALPRAZolam, chlorpheniramine-HYDROcodone, diphenhydrAMINE, guaiFENesin-dextromethorphan, levalbuterol, lip balm, ondansetron **OR** ondansetron (ZOFRAN) IV, oxyCODONE, polyethylene glycol, sodium chloride, sodium chloride flush, zolpidem   Time Spent in minutes  35  See all Orders from today for further details  Jeoffrey Massed M.D on 01/23/2019 at 11:23 AM  To page go to [email or web address] - use universal password  [facility] -  Office  [phone]     Objective:   Vitals:   01/22/19 1953 01/23/19 0406 01/23/19 0500 01/23/19 0738  BP: 116/63 131/63  112/62  Pulse: (!) 102   68  Resp: (!) 23   15  Temp: 97.7 F (36.5 C) 98.1 F (36.7 C)  (!) 96.7 F (35.9 C)  TempSrc: Oral Oral  Axillary  SpO2: 93%   90%  Weight:   120.2 kg   Height:        Wt Readings from Last 3 Encounters:  01/23/19 120.2 kg  07/27/18 119.3 kg     Intake/Output Summary (Last 24 hours) at 01/23/2019 1123 Last data filed at 01/23/2019 1009 Gross per 24 hour  Intake 43 ml  Output --  Net 43 ml     Physical Exam Gen Exam:Alert awake-not in any distress HEENT:atraumatic, normocephalic Chest: B/L clear to auscultation anteriorly CVS:S1S2 regular Abdomen:soft non tender, non distended Extremities:+ edema Neurology: Non focal Skin: no rash   Data Review:    CBC Recent Labs  Lab 01/17/19 0225 01/18/19 0155  01/19/19 0215 01/20/19 0142 01/21/19 0155 01/22/19 0148 01/23/19 0150  WBC 8.4 8.7  --  8.0 7.3 9.1 9.6 9.3  HGB 12.6 12.8   < > 13.7 13.6 13.9 13.5 14.0  HCT 38.7 39.0   < > 41.6 40.9 41.1 40.7 42.1  PLT 215 274  --  326 348 299 251 177  MCV 88.8 87.6  --  86.7 85.6 85.3 85.9 85.7  MCH 28.9 28.8  --  28.5 28.5 28.8 28.5 28.5  MCHC 32.6 32.8  --  32.9 33.3 33.8 33.2 33.3  RDW 17.2* 17.2*  --  17.0* 16.9* 16.7* 17.0* 17.2*  LYMPHSABS 0.3* 0.6*  --  0.5* 0.4*  --   --   --   MONOABS 0.3 0.4  --  0.3 0.4  --   --   --   EOSABS 0.0 0.0  --  0.0 0.0  --   --   --   BASOSABS 0.0 0.0  --  0.0 0.0  --   --   --    < > = values in this interval not displayed.    Chemistries  Recent Labs  Lab 01/17/19 0225 01/18/19 0155  01/19/19 0215 01/20/19 0142 01/21/19 0155 01/22/19 0148 01/23/19 0150  NA 138 138   < > 139 137 135 134* 131*  K 4.7 4.7   < > 4.3 4.3 4.7 4.6 4.5  CL 103 103  --  100 99 97* 96* 94*  CO2 27 30  --  28 29 24 28 25   GLUCOSE 196* 216*  --  218* 226* 309* 307* 274*  BUN 12 20  --  23 23 26* 32* 32*  CREATININE 0.75  0.70  --  0.79 0.91 0.92 0.99 0.99  CALCIUM 8.3* 8.2*  --  8.3* 8.1* 8.0* 8.0* 7.7*  MG 2.1 2.2  --   --   --   --   --   --   AST 34 25  --  36 28 27 26 25   ALT 17 18  --  22 21 22 25 27   ALKPHOS 38 41  --  50 50 61 66 66  BILITOT 0.3 0.4  --  0.5 0.6 0.4 0.5 0.5   < > = values in this interval  not displayed.   ------------------------------------------------------------------------------------------------------------------ No results for input(s): CHOL, HDL, LDLCALC, TRIG, CHOLHDL, LDLDIRECT in the last 72 hours.  Lab Results  Component Value Date   HGBA1C 7.1 (H) 01/15/2019   ------------------------------------------------------------------------------------------------------------------ No results for input(s): TSH, T4TOTAL, T3FREE, THYROIDAB in the last 72 hours.  Invalid input(s): FREET3 ------------------------------------------------------------------------------------------------------------------ Recent Labs    01/22/19 0148 01/23/19 0150  FERRITIN 154 164    Coagulation profile No results for input(s): INR, PROTIME in the last 168 hours.  Recent Labs    01/22/19 0148 01/23/19 0150  DDIMER 10.92* 9.46*    Cardiac Enzymes No results for input(s): CKMB, TROPONINI, MYOGLOBIN in the last 168 hours.  Invalid input(s): CK ------------------------------------------------------------------------------------------------------------------    Component Value Date/Time   BNP 127.1 (H) 01/19/2019 0215    Micro Results Recent Results (from the past 240 hour(s))  SARS Coronavirus 2 [facility] order, Performed in [facility] hospital lab) Nasopharyngeal Nasopharyngeal Swab     Status: Abnormal   Collection Time: 01/15/19 10:35 AM   Specimen: Nasopharyngeal Swab  Result Value Ref Range Status   SARS Coronavirus 2 POSITIVE (A) NEGATIVE Final    Comment: CRITICAL RESULT CALLED TO, READ BACK BY AND VERIFIED WITH: RN ELIZABETH TAYLOR [phone] FCP (NOTE) If result is  NEGATIVE SARS-CoV-2 target nucleic acids are NOT DETECTED. The SARS-CoV-2 RNA is generally detectable in upper and lower  respiratory specimens during the acute phase of infection. The lowest  concentration of SARS-CoV-2 viral copies this assay can detect is 250  copies / mL. A negative result does not preclude SARS-CoV-2 infection  and should not be used as the sole basis for treatment or other  patient management decisions.  A negative result may occur with  improper specimen collection / handling, submission of specimen other  than nasopharyngeal swab, presence of viral mutation(s) within the  areas targeted by this assay, and inadequate number of viral copies  (<250 copies / mL). A negative result must be combined with clinical  observations, patient history, and epidemiological information. If result is POSITIVE SARS-CoV-2 target nucleic acids are DET ECTED. The SARS-CoV-2 RNA is generally detectable in upper and lower  respiratory specimens during the acute phase of infection.  Positive  results are indicative of active infection with SARS-CoV-2.  Clinical  correlation with patient history and other diagnostic information is  necessary to determine patient infection status.  Positive results do  not rule out bacterial infection or co-infection with other viruses. If result is PRESUMPTIVE POSTIVE SARS-CoV-2 nucleic acids MAY BE PRESENT.   A presumptive positive result was obtained on the submitted specimen  and confirmed on repeat testing.  While 2019 novel coronavirus  (SARS-CoV-2) nucleic acids may be present in the submitted sample  additional confirmatory testing may be necessary for epidemiological  and / or clinical management purposes  to differentiate between  SARS-CoV-2 and other Sarbecovirus currently known to infect humans.  If clinically indicated additional testing with an alternate test  methodology (LA [phone]) is advised. The SARS-CoV-2 RNA is generally  detectable  in upper and lower respiratory specimens during the acute  phase of infection. The expected result is Negative. Fact Sheet for Patients:  BoilerBrush.com.cy Fact Sheet for Healthcare Providers: https://pope.com/ This test is not yet approved or cleared by the [place] FDA and has been authorized for detection and/or diagnosis of SARS-CoV-2 by FDA under an Emergency Use Authorization (EUA).  This EUA will remain in effect (meaning this test can be used) for the duration of the  COVID-19 declaration under Section 564(b)(1) of the Act, 21 U.S.C. section 360bbb-3(b)(1), unless the authorization is terminated or revoked sooner. Performed at [facility], [address] [place], [place] [address]   Blood Culture (routine x 2)     Status: Abnormal   Collection Time: 01/15/19  3:00 PM   Specimen: BLOOD  Result Value Ref Range Status   Specimen Description BLOOD RIGHT ANTECUBITAL  Final   Special Requests   Final    BOTTLES DRAWN AEROBIC AND ANAEROBIC Blood Culture adequate volume   Culture  Setup Time   Final    ANAEROBIC BOTTLE ONLY GRAM POSITIVE COCCI IN CLUSTERS CRITICAL RESULT CALLED TO, READ BACK BY AND VERIFIED WITH: Lois HuxleyK. Patton PharmD 15:10 01/16/19 ([email or web address])    Culture (A)  Final    STAPHYLOCOCCUS SPECIES (COAGULASE NEGATIVE) THE SIGNIFICANCE OF ISOLATING THIS ORGANISM FROM A SINGLE SET OF BLOOD CULTURES WHEN MULTIPLE SETS ARE DRAWN IS UNCERTAIN. PLEASE NOTIFY THE MICROBIOLOGY DEPARTMENT WITHIN ONE WEEK IF SPECIATION AND SENSITIVITIES ARE REQUIRED. Performed at [facility], [address] [place], [place] [address]    Report Status 01/17/2019 FINAL  Final  Blood Culture ID Panel (Reflexed)     Status: Abnormal   Collection Time: 01/15/19  3:00 PM  Result Value Ref Range Status   Enterococcus species NOT DETECTED NOT DETECTED Final   Listeria monocytogenes NOT DETECTED NOT DETECTED Final   Staphylococcus species  DETECTED (A) NOT DETECTED Final    Comment: Methicillin (oxacillin) susceptible coagulase negative staphylococcus. Possible blood culture contaminant (unless isolated from more than one blood culture draw or clinical case suggests pathogenicity). No antibiotic treatment is indicated for blood  culture contaminants. CRITICAL RESULT CALLED TO, READ BACK BY AND VERIFIED WITH: Lois HuxleyK. Patton PharmD 15:10 01/16/19 ([email or web address])    Staphylococcus aureus (BCID) NOT DETECTED NOT DETECTED Final   Methicillin resistance NOT DETECTED NOT DETECTED Final   Streptococcus species NOT DETECTED NOT DETECTED Final   Streptococcus agalactiae NOT DETECTED NOT DETECTED Final   Streptococcus pneumoniae NOT DETECTED NOT DETECTED Final   Streptococcus pyogenes NOT DETECTED NOT DETECTED Final   Acinetobacter baumannii NOT DETECTED NOT DETECTED Final   Enterobacteriaceae species NOT DETECTED NOT DETECTED Final   Enterobacter cloacae complex NOT DETECTED NOT DETECTED Final   Escherichia coli NOT DETECTED NOT DETECTED Final   Klebsiella oxytoca NOT DETECTED NOT DETECTED Final   Klebsiella pneumoniae NOT DETECTED NOT DETECTED Final   Proteus species NOT DETECTED NOT DETECTED Final   Serratia marcescens NOT DETECTED NOT DETECTED Final   Haemophilus influenzae NOT DETECTED NOT DETECTED Final   Neisseria meningitidis NOT DETECTED NOT DETECTED Final   Pseudomonas aeruginosa NOT DETECTED NOT DETECTED Final   Candida albicans NOT DETECTED NOT DETECTED Final   Candida glabrata NOT DETECTED NOT DETECTED Final   Candida krusei NOT DETECTED NOT DETECTED Final   Candida parapsilosis NOT DETECTED NOT DETECTED Final   Candida tropicalis NOT DETECTED NOT DETECTED Final    Comment: Performed at [facility], [address] [place], [place] [address]  Blood Culture (routine x 2)     Status: None   Collection Time: 01/15/19  7:50 PM   Specimen: BLOOD RIGHT HAND  Result Value Ref Range Status   Specimen Description BLOOD RIGHT HAND   Final   Special Requests   Final    BOTTLES DRAWN AEROBIC AND ANAEROBIC Blood Culture results may not be optimal due to an inadequate volume of blood received in culture bottles  Culture   Final    NO GROWTH 5 DAYS Performed at [facility], [place] [address] [place],  [address]    Report Status 01/20/2019 FINAL  Final    Radiology Reports Ct Angio Chest Pe W And/or Wo Contrast  Result Date: 01/15/2019 CLINICAL DATA:  EMS call for CP and SHOB x 1 week . Pt reported Pain increased with movement. Pt has Dx of A-fib and a ablation the patient . Pt given 324 ASA and one NGT SL . BP 120/70 prior to meds and BP 74/40 after meds. On arrival EMS reported BP 120/70. Pt reports relief of CP From meds. HR 80, CBG 137, 97.2 temp. EXAM: CT ANGIOGRAPHY CHEST CT ABDOMEN AND PELVIS WITH CONTRAST TECHNIQUE: Multidetector CT imaging of the chest was performed using the standard protocol during bolus administration of intravenous contrast. Multiplanar CT image reconstructions and MIPs were obtained to evaluate the vascular anatomy. Multidetector CT imaging of the abdomen and pelvis was performed using the standard protocol during bolus administration of intravenous contrast. CONTRAST:  117mL OMNIPAQUE IOHEXOL 350 MG/ML SOLN COMPARISON:  None. FINDINGS: CTA CHEST FINDINGS Cardiovascular: There is satisfactory opacification of the pulmonary arteries to the segmental level. There is no evidence of a pulmonary embolism. Heart is normal in size. No pericardial effusion. Minimal three-vessel coronary artery calcifications. Great vessels are normal in caliber. Mild aortic atherosclerosis. No dissection. Mediastinum/Nodes: Large hiatal hernia, lying to the right of the lower thoracic spine. Esophagus unremarkable. Trachea widely patent. Normal thyroid. No neck base, axillary, mediastinal or hilar masses or enlarged lymph nodes. Lungs/Pleura: Bilateral, peripheral ground-glass airspace opacities. Slight upper lobe  predominance. No lung masses or suspicious nodules. No interstitial thickening. No pleural effusion or pneumothorax. Musculoskeletal: No fracture or acute finding. No osteoblastic or osteolytic lesions. Review of the MIP images confirms the above findings. CT ABDOMEN and PELVIS FINDINGS Hepatobiliary: Decreased attenuation of the liver consistent with fatty infiltration. No liver mass or focal lesion. Liver normal in size. Normal gallbladder. No bile duct dilation. Pancreas: Unremarkable. No pancreatic ductal dilatation or surrounding inflammatory changes. Spleen: Normal in size without focal abnormality. Adrenals/Urinary Tract: Adrenal glands are unremarkable. Kidneys are normal, without renal calculi, focal lesion, or hydronephrosis. Bladder is unremarkable. Stomach/Bowel: Stomach unremarkable other than the large hiatal hernia. Small bowel and colon are normal in caliber. No wall thickening. No inflammation. There are colonic diverticula mostly along the left colon. No diverticulitis. Normal appendix visualized. Vascular/Lymphatic: Mild aortic atherosclerosis.  No aneurysm. No enlarged lymph nodes. Reproductive: Small partly calcified uterine fibroids. Uterus normal in overall size. No ovarian/adnexal masses. Other: No abdominal wall hernia or abnormality. No abdominopelvic ascites. Musculoskeletal: No fracture or acute finding. No osteoblastic or osteolytic lesions. Review of the MIP images confirms the above findings. IMPRESSION: CTA CHEST 1. No evidence of a pulmonary embolism. 2. Bilateral peripheral ground-glass airspace lung opacities with a slight upper lobe predominance. Findings consistent with multifocal infection. Consider atypical infection including viral infection. Noninfectious lung inflammation may also have this appearance. 3. No other acute findings in the chest. 4. Large hiatal hernia. CT ABDOMEN AND PELVIS 1. No acute findings within the abdomen or pelvis. 2. Hepatic steatosis. 3. Aortic  atherosclerosis. 4. Scattered colonic diverticula without diverticulitis. Electronically Signed   By: Lajean Manes M.D.   On: 01/15/2019 12:01   Ct Abdomen Pelvis W Contrast  Result Date: 01/15/2019 CLINICAL DATA:  EMS call for CP and SHOB x 1 week . Pt reported Pain increased with movement. Pt  has Dx of A-fib and a ablation the patient . Pt given 324 ASA and one NGT SL . BP 120/70 prior to meds and BP 74/40 after meds. On arrival EMS reported BP 120/70. Pt reports relief of CP From meds. HR 80, CBG 137, 97.2 temp. EXAM: CT ANGIOGRAPHY CHEST CT ABDOMEN AND PELVIS WITH CONTRAST TECHNIQUE: Multidetector CT imaging of the chest was performed using the standard protocol during bolus administration of intravenous contrast. Multiplanar CT image reconstructions and MIPs were obtained to evaluate the vascular anatomy. Multidetector CT imaging of the abdomen and pelvis was performed using the standard protocol during bolus administration of intravenous contrast. CONTRAST:  OMNIPAQUE IOHEXOL 350 MG/ML SOLN COMPARISON:  None. FINDINGS: CTA CHEST FINDINGS Cardiovascular: There is satisfactory opacification of the pulmonary arteries to the segmental level. There is no evidence of a pulmonary embolism. Heart is normal in size. No pericardial effusion. Minimal three-vessel coronary artery calcifications. Great vessels are normal in caliber. Mild aortic atherosclerosis. No dissection. Mediastinum/Nodes: Large hiatal hernia, lying to the right of the lower thoracic spine. Esophagus unremarkable. Trachea widely patent. Normal thyroid. No neck base, axillary, mediastinal or hilar masses or enlarged lymph nodes. Lungs/Pleura: Bilateral, peripheral ground-glass airspace opacities. Slight upper lobe predominance. No lung masses or suspicious nodules. No interstitial thickening. No pleural effusion or pneumothorax. Musculoskeletal: No fracture or acute finding. No osteoblastic or osteolytic lesions. Review of the MIP images  confirms the above findings. CT ABDOMEN and PELVIS FINDINGS Hepatobiliary: Decreased attenuation of the liver consistent with fatty infiltration. No liver mass or focal lesion. Liver normal in size. Normal gallbladder. No bile duct dilation. Pancreas: Unremarkable. No pancreatic ductal dilatation or surrounding inflammatory changes. Spleen: Normal in size without focal abnormality. Adrenals/Urinary Tract: Adrenal glands are unremarkable. Kidneys are normal, without renal calculi, focal lesion, or hydronephrosis. Bladder is unremarkable. Stomach/Bowel: Stomach unremarkable other than the large hiatal hernia. Small bowel and colon are normal in caliber. No wall thickening. No inflammation. There are colonic diverticula mostly along the left colon. No diverticulitis. Normal appendix visualized. Vascular/Lymphatic: Mild aortic atherosclerosis.  No aneurysm. No enlarged lymph nodes. Reproductive: Small partly calcified uterine fibroids. Uterus normal in overall size. No ovarian/adnexal masses. Other: No abdominal wall hernia or abnormality. No abdominopelvic ascites. Musculoskeletal: No fracture or acute finding. No osteoblastic or osteolytic lesions. Review of the MIP images confirms the above findings. IMPRESSION: CTA CHEST 1. No evidence of a pulmonary embolism. 2. Bilateral peripheral ground-glass airspace lung opacities with a slight upper lobe predominance. Findings consistent with multifocal infection. Consider atypical infection including viral infection. Noninfectious lung inflammation may also have this appearance. 3. No other acute findings in the chest. 4. Large hiatal hernia. CT ABDOMEN AND PELVIS 1. No acute findings within the abdomen or pelvis. 2. Hepatic steatosis. 3. Aortic atherosclerosis. 4. Scattered colonic diverticula without diverticulitis. Electronically Signed   By: Amie Portland M.D.   On: 01/15/2019 12:01   Dg Chest Port 1 View  Result Date: 01/15/2019 CLINICAL DATA:  Fall 7/25, chest  pain/SOB since then. Hx of stroke, lupus, hypertension, diabetes, coronary artery disease, asthma, afib. Nonsmoker. EXAM: PORTABLE CHEST 1 VIEW COMPARISON:  None. FINDINGS: Cardiac silhouette is mildly enlarged. No mediastinal or hilar masses. Subtle hazy airspace opacities are noted in the peripheral left mid and lower lung, possibly in the peripheral right mid lung. There are mildly prominent bronchovascular markings diffusely. No convincing pleural effusion.  No pneumothorax. Skeletal structures are grossly intact. IMPRESSION: 1. Subtle hazy airspace opacities in the left mid  and lower lung and possibly right peripheral mid lung. Consider multifocal pneumonia if there are consistent clinical findings. Electronically Signed   By: Amie Portlandavid  Ormond M.D.   On: 01/15/2019 10:26   Dg Chest Port 1v Same Day  Result Date: 01/18/2019 CLINICAL DATA:  Shortness of breath EXAM: PORTABLE CHEST 1 VIEW COMPARISON:  January 15, 2019 FINDINGS: The study is extremely limited due to positioning. Patchy airspace opacities seen throughout both lungs, left greater than right. The cardiomediastinal silhouette is obscured. IMPRESSION: Extremely limited exam due to patient positioning. Airspace opacities throughout both lungs. Electronically Signed   By: Jonna ClarkBindu  Avutu M.D.   On: 01/18/2019 11:10

## 2019-01-23 NOTE — Progress Notes (Signed)
Inpatient Diabetes Program Recommendations  AACE/ADA: New Consensus Statement on Inpatient Glycemic Control (2015)  Target Ranges:  Prepandial:   less than 140 mg/dL      Peak postprandial:   less than 180 mg/dL (1-2 hours)      Critically ill patients:  140 - 180 mg/dL   Lab Results  Component Value Date   GLUCAP 296 (H) 01/23/2019   HGBA1C 7.1 (H) 01/15/2019    Review of Glycemic Control Results for Stacey Burns, Stacey Burns (MRN [id number]) as of 01/23/2019 09:09  Ref. Range 01/22/2019 08:15 01/22/2019 11:54 01/22/2019 16:42 01/22/2019 20:42 01/23/2019 07:38  Glucose-Capillary Latest Ref Range: 70 - 99 mg/dL 300 (H) 335 (H) 241 (H) 292 (H) 296 (H)   Diabetes history: DM 2 Outpatient Diabetes medications: NPH 2-10 units per SSI + Ozempic 0.25 mg Qweek  Current orders for Inpatient glycemic control:  Lantus 28 units Daily Novolog 0-20 units tid Novolog 0-5 units qhs Novolog 8 units tid meal coverage  Solumedrol 40 mg Q8  Inpatient Diabetes Program Recommendations:    Fasting glucose 296 this am. Consider increasing Lantus to 36 units (0.3 units/kg).  Thanks,  Tama Headings RN, MSN, BC-ADM Inpatient Diabetes Coordinator Team Pager [phone] (8a-5p)

## 2019-01-23 NOTE — Progress Notes (Signed)
called case manager at [facility] requesting that since the patient is in custody of [place] we must reach out to [employer] verify that Pt is still here with [place] [employer] or the [employer] We should also do this every shift to avoid confusion, and notify them if she is to be discharged home. [phone]

## 2019-01-24 LAB — COMPREHENSIVE METABOLIC PANEL
ALT: 36 U/L (ref 0–44)
AST: 37 U/L (ref 15–41)
Albumin: 2.5 g/dL — ABNORMAL LOW (ref 3.5–5.0)
Alkaline Phosphatase: 68 U/L (ref 38–126)
Anion gap: 13 (ref 5–15)
BUN: 35 mg/dL — ABNORMAL HIGH (ref 8–23)
CO2: 29 mmol/L (ref 22–32)
Calcium: 7.9 mg/dL — ABNORMAL LOW (ref 8.9–10.3)
Chloride: 92 mmol/L — ABNORMAL LOW (ref 98–111)
Creatinine, Ser: 1.02 mg/dL — ABNORMAL HIGH (ref 0.44–1.00)
GFR calc Af Amer: >60 mL/min (ref >60)
GFR calc non Af Amer: 58 mL/min — ABNORMAL LOW (ref >60)
Glucose, Bld: 92 mg/dL (ref 70–99)
Potassium: 4.2 mmol/L (ref 3.5–5.1)
Sodium: 134 mmol/L — ABNORMAL LOW (ref 135–145)
Total Bilirubin: 0.8 mg/dL (ref 0.3–1.2)
Total Protein: 6.7 g/dL (ref 6.5–8.1)

## 2019-01-24 LAB — CBC
HCT: 43.6 % (ref 36.0–46.0)
Hemoglobin: 14.9 g/dL (ref 12.0–15.0)
MCH: 29.2 pg (ref 26.0–34.0)
MCHC: 34.2 g/dL (ref 30.0–36.0)
MCV: 85.5 fL (ref 80.0–100.0)
Platelets: 227 10*3/uL (ref 150–400)
RBC: 5.1 MIL/uL (ref 3.87–5.11)
RDW: 17.3 % — ABNORMAL HIGH (ref 11.5–15.5)
WBC: 12.4 10*3/uL — ABNORMAL HIGH (ref 4.0–10.5)
nRBC: 0 % (ref 0.0–0.2)

## 2019-01-24 LAB — GLUCOSE, CAPILLARY
Glucose-Capillary: 204 mg/dL — ABNORMAL HIGH (ref 70–99)
Glucose-Capillary: 280 mg/dL — ABNORMAL HIGH (ref 70–99)
Glucose-Capillary: 349 mg/dL — ABNORMAL HIGH (ref 70–99)

## 2019-01-24 LAB — FERRITIN: Ferritin: 178 ng/mL (ref 11–307)

## 2019-01-24 LAB — C-REACTIVE PROTEIN: CRP: 0.8 mg/dL (ref <1.0)

## 2019-01-24 LAB — D-DIMER, QUANTITATIVE: D-Dimer, Quant: 7.52 ug/mL-FEU — ABNORMAL HIGH (ref 0.00–0.50)

## 2019-01-24 NOTE — Progress Notes (Signed)
Physical Therapy Treatment Patient Details Name: Stacey Burns MRN: [id number] DOB: [birth year] Today's Date: 01/24/2019    History of Present Illness Patient is a 63 y.o. female with PMHx of lupus , DM, HTN, A. fib who presented 8/2  with 1 week history of abdominal cramps, chest discomfort and progressive shortness of breath-she was found to have acute hypoxic respiratory failure in the setting of COVID-19 pneumonia.  Hospital course complicated by worsening hypoxemia requiring 15 L high flow oxygen.    PT Comments    Pt making steady progress. Pt did very well with bariatric rollator and feel she could benefit from one at home. Did have to incr her O2 to 6L with ambulation to keep sats in mid 80's.    Follow Up Recommendations  Home health PT;Supervision for mobility/OOB     Equipment Recommendations  Other (comment)(bariatric rollator)    Recommendations for Other Services       Precautions / Restrictions Precautions Precautions: Fall Precaution Comments: on HFNC, desats Restrictions Weight Bearing Restrictions: No    Mobility  Bed Mobility               General bed mobility comments: Pt up in recliner  Transfers Overall transfer level: Needs assistance Equipment used: None;4-wheeled walker Transfers: Sit to/from Stand;Stand Pivot Transfers Sit to Stand: Supervision Stand pivot transfers: Supervision       General transfer comment: Supervision for lines/safety  Ambulation/Gait Ambulation/Gait assistance: Supervision Gait Distance (Feet): 100 Feet(100' x 1, 50' x 2) Assistive device: 4-wheeled walker Gait Pattern/deviations: Step-to pattern;Decreased stride length Gait velocity: decr Gait velocity interpretation: 1.31 - 2.62 ft/sec, indicative of limited community ambulator General Gait Details: Slow, steady gait using rollator. Pt took 2 seated rest breaks on walker and amb a total of 200'. Pt amb on 6L of O2 with SpO2 84-86% with amb. Recovered to  90% with seated rest of 2-3 minutes.    Stairs             Wheelchair Mobility    Modified Rankin (Stroke Patients Only)       Balance Overall balance assessment: Needs assistance Sitting-balance support: No upper extremity supported;Feet supported Sitting balance-Leahy Scale: Good     Standing balance support: No upper extremity supported;During functional activity Standing balance-Leahy Scale: Fair                              Cognition Arousal/Alertness: Awake/alert Behavior During Therapy: WFL for tasks assessed/performed Overall Cognitive Status: Within Functional Limits for tasks assessed                                        Exercises      General Comments General comments (skin integrity, edema, etc.): RR elevating to 30s, HR 90s, and SpO2 >85% on 4L during activity      Pertinent Vitals/Pain Pain Assessment: No/denies pain    Home Living                      Prior Function            PT Goals (current goals can now be found in the care plan section) Acute Rehab PT Goals Patient Stated Goal: to go to the homeplace where ther are no steps Progress towards PT goals: Progressing toward goals    Frequency  Min 3X/week      PT Plan Current plan remains appropriate    Co-evaluation              AM-PAC PT "6 Clicks" Mobility   Outcome Measure  Help needed turning from your back to your side while in a flat bed without using bedrails?: A Little Help needed moving from lying on your back to sitting on the side of a flat bed without using bedrails?: A Little Help needed moving to and from a bed to a chair (including a wheelchair)?: None Help needed standing up from a chair using your arms (e.g., wheelchair or bedside chair)?: A Little Help needed to walk in hospital room?: A Little Help needed climbing 3-5 steps with a railing? : A Little 6 Click Score: 19    End of Session Equipment Utilized  During Treatment: Oxygen Activity Tolerance: Patient tolerated treatment well Patient left: in chair;with call bell/phone within reach   PT Visit Diagnosis: Unsteadiness on feet (R26.81);Difficulty in walking, not elsewhere classified (R26.2)     Time: 8657-8469 PT Time Calculation (min) (ACUTE ONLY): 27 min  Charges:  $Gait Training: 23-37 mins                     [place] Pager [phone] Office [place] 01/24/2019, 3:39 PM

## 2019-01-24 NOTE — Progress Notes (Signed)
Patient had a 1.83 seconds pause while asleep. Dr. Josephine Cables paged and he  instructed to keep monitoring patient.

## 2019-01-24 NOTE — Plan of Care (Signed)
Will continue with patients plan of care. 

## 2019-01-24 NOTE — Progress Notes (Signed)
Occupational Therapy Treatment Patient Details Name: Stacey Burns MRN: [id number] DOB: 21-Aug-1955 Today's Date: 01/24/2019    History of present illness Patient is a 63 y.o. female with PMHx of lupus , DM, HTN, A. fib who presented 8/2  with 1 week history of abdominal cramps, chest discomfort and progressive shortness of breath-she was found to have acute hypoxic respiratory failure in the setting of COVID-19 pneumonia.  Hospital course complicated by worsening hypoxemia requiring 15 L high flow oxygen.   OT comments  Pt progressing towards established OT goals and very motivated to participate in therapy. Pt performing toileting and hang hygiene with Supervision-Min Guard A for safety. Pt performing functional mobility in hallway to simulated home distance with Min Guard A and RW; RR 30s, SpO2 >85% on 4L, and HR 90s. Continue to recommend dc to home with HHOT and will continue to follow acutely as admitted.    Follow Up Recommendations  Home health OT;Supervision/Assistance - 24 hour    Equipment Recommendations  3 in 1 bedside commode;Tub/shower seat(3:1 vs tub/shower seat)    Recommendations for Other Services      Precautions / Restrictions Precautions Precautions: Fall Precaution Comments: on HFNC, desats Restrictions Weight Bearing Restrictions: No       Mobility Bed Mobility               General bed mobility comments: seated EOB upon arrival  Transfers Overall transfer level: Needs assistance Equipment used: None;Rolling walker (2 wheeled) Transfers: Sit to/from [employer] CorporationStand;Stand Pivot Transfers Sit to Stand: Supervision Stand pivot transfers: Supervision       General transfer comment: Supervision for safety    Balance Overall balance assessment: Needs assistance Sitting-balance support: No upper extremity supported;Feet supported Sitting balance-Leahy Scale: Good     Standing balance support: No upper extremity supported;During functional  activity Standing balance-Leahy Scale: Fair                             ADL either performed or assessed with clinical judgement   ADL Overall ADL's : Needs assistance/impaired     Grooming: Wash/dry hands;Min guard;Standing Grooming Details (indicate cue type and reason): Pt performing hand hygiene at sink with Min Guard A for safety.                Lower Body Dressing Details (indicate cue type and reason): Pt dropping her shoes to floor and slipping them on  Toilet Transfer: BSC;Supervision/safety;Stand-pivot Toilet Transfer Details (indicate cue type and reason): Supervision for safety  to [facility] Toileting- Clothing Manipulation and Hygiene: Supervision/safety;Sitting/lateral lean       Functional mobility during ADLs: Min guard;Rolling walker General ADL Comments: Pt performed toileting, hand hygiene, and functional mobility in hallway with Supervision-Min Guard A. SpO2 >85% on 4L; pt able to elevate Spo2 to 90s with standing rest breaks.     Vision       Perception     Praxis      Cognition Arousal/Alertness: Awake/alert Behavior During Therapy: WFL for tasks assessed/performed Overall Cognitive Status: Within Functional Limits for tasks assessed                                          Exercises     Shoulder Instructions       General Comments RR elevating to 30s, HR 90s, and SpO2 >85% on  4L during activity    Pertinent Vitals/ Pain       Pain Assessment: No/denies pain  Home Living                                          Prior Functioning/Environment              Frequency  Min 3X/week        Progress Toward Goals  OT Goals(current goals can now be found in the care plan section)  Progress towards OT goals: Progressing toward goals  Acute Rehab OT Goals Patient Stated Goal: to go to the homeplace where ther are no steps OT Goal Formulation: With patient Time For Goal Achievement:  02/05/19 Potential to Achieve Goals: Good ADL Goals Pt Will Perform Grooming: with modified independence;standing Pt Will Perform Lower Body Bathing: with modified independence;sit to/from stand Pt Will Perform Upper Body Dressing: with modified independence;sitting Pt Will Perform Lower Body Dressing: with modified independence;sit to/from stand Pt Will Transfer to Toilet: with modified independence;ambulating Pt Will Perform Toileting - Clothing Manipulation and hygiene: with modified independence;sit to/from stand Pt Will Perform Tub/Shower Transfer: Tub transfer;with supervision;ambulating;3 in 1;tub bench Pt/caregiver will Perform Home Exercise Program: Increased strength;Both right and left upper extremity;Independently;With written HEP provided Additional ADL Goal #1: Pt will independently verbalize at least 3 energy conservation techniques to incorporate into functional task.  Plan Discharge plan remains appropriate    Co-evaluation                 AM-PAC OT "6 Clicks" Daily Activity     Outcome Measure   Help from another person eating meals?: None Help from another person taking care of personal grooming?: None Help from another person toileting, which includes using toliet, bedpan, or urinal?: A Little Help from another person bathing (including washing, rinsing, drying)?: A Little Help from another person to put on and taking off regular upper body clothing?: None Help from another person to put on and taking off regular lower body clothing?: A Little 6 Click Score: 21    End of Session Equipment Utilized During Treatment: Rolling walker;Oxygen(4L)  OT Visit Diagnosis: Unsteadiness on feet (R26.81);Muscle weakness (generalized) (M62.81)(decreased activity tolerance)   Activity Tolerance Patient tolerated treatment well   Patient Left with call bell/phone within reach;in bed(EOB)   Nurse Communication Mobility status        Time: 0922-0950 OT Time  Calculation (min): 28 min  Charges: OT General Charges $OT Visit: 1 Visit OT Treatments $Self Care/Home Management : 8-22 mins $Therapeutic Activity: 8-22 mins  [place], OTR/L Acute Rehab Pager: [phone] Office: [place] 01/24/2019, 12:28 PM

## 2019-01-24 NOTE — Progress Notes (Signed)
PROGRESS NOTE                                                                                                                                                                                                             Patient Demographics:    Stacey Burns, is a 63 y.o. female, DOB - 09-07-1955, ZOX:[id number]  Outpatient Primary MD for the patient is Tollie Eth   Admit date - 01/15/2019   LOS - 8  Chief Complaint  Patient presents with   Chest Pain       Brief Narrative: Patient is a 63 y.o. female with PMHx of lupus on Imuran, DM, HTN, A. fib on Eliquis who presented with 1 week history of abdominal cramps, chest discomfort and progressive shortness of breath-she was found to have acute hypoxic respiratory failure in the setting of COVID-19 pneumonia.  Hospital course complicated by worsening hypoxemia requiring 15 L high flow oxygen.   Subjective:    Stacey Burns continues to slowly improve-on 4 L of oxygen this morning.   Assessment  & Plan :   Acute Hypoxic Resp Failure due to Covid 19 Viral pneumonia: Improved-on 4 L.  Will start tapering steroids-change Solu-Medrol to twice daily.  Completed a course of Remdesivir, she is s/p Actemra on 8/6   Fever: Afebrile  Oxygen requirement: On 4 L (on around 5 L yesterday)   COVID-19 Labs: Recent Labs    01/22/19 0148 01/23/19 0150 01/24/19 0220  DDIMER 10.92* 9.46* 7.52*  FERRITIN 154 164 178  CRP 2.2* 1.1* 0.8    COVID-19 Medications: Steroids: Decadron 8/3-8/5, Solu-Medrol 8/5>> Remdesivir: 8/3>>8/7 Actemra: 8/6x1 Convalescent Plasma: N/A Research Studies: N/A  Other medications: Diuretics: Lasix-change to twice daily dosing today. Antibiotics: None  A. fib/atrial fibrillation with RVR with spells of bradycardia: Remaining sinus rhythm-no flutter episodes overnight, has had episodes of sinus bradycardia overnight since the  beginning of hospitalization-probably related to OSA.  Amiodarone was changed back to twice daily dosing after speaking with cardiologist-Dr. Rennis Golden over the phone on 8/10.  Continue Eliquis  Chronic diastolic heart failure: Compensated-continues to have some mild trace/1+ pitting edema-continue IV Lasix for 1 more day before changing to oral dosing.  Weight decreased to 264 pounds-I/os inaccurate  HTN: Controlled-monitor off antihypertensives  Insulin-dependent DM-2: CBGs better-steroids changed to twice daily dosing-for now  continue Lantus 34 units, 8 units of NovoLog with meals and resistant SSI.  Follow and adjust.   Lupus: Appears to be stable-apparently was diagnosed with pancolitis in 2015-continue to hold Imuran-see above regarding discussion about Actemra.  COPD/asthma: No wheezing-appears stable-continue as needed bronchodilators      OSA: Not on CPAP due to COVID-on high flow oxygen  Coagulase-negative Staphylococcus bacteremia times 1 out of 2 cultures: Likely contaminant-no indication of infection  Obesity: Estimated body mass index is 45.49 kg/m as calculated from the following:   Height as of this encounter: 5\' 4"  (1.626 m).   Weight as of this encounter: 120.2 kg.   ABG:    Component Value Date/Time   PHART 7.498 (H) 01/18/2019 1018   PCO2ART 33.5 01/18/2019 1018   PO2ART 46.0 (L) 01/18/2019 1018   HCO3 26.0 01/18/2019 1018   TCO2 27 01/18/2019 1018   O2SAT 86.0 01/18/2019 1018      Condition -guarded  Family Communication  : Voicemail left for brother on 8/10  Code Status :  Full Code  Diet :  Diet Order            Diet Carb Modified Fluid consistency: Thin; Room service appropriate? Yes  Diet effective now               Disposition Plan  :  Remain hospitalized-not stable for discharge-suspect home with home health services later this week.  May require home O2  Consults  :  None  Procedures  :  None  GI prophylaxis: PPI  DVT Prophylaxis  :  Eliquis  Lab Results  Component Value Date   PLT 227 01/24/2019    Antibiotics  :    Anti-infectives (From admission, onward)   Start     Dose/Rate Route Frequency Ordered Stop   01/17/19 1700  remdesivir 100 mg in sodium chloride 0.9 % 250 mL IVPB     100 mg 500 mL/hr over 30 Minutes Intravenous Every 24 hours 01/16/19 1617 01/20/19 1745   01/16/19 1700  remdesivir 200 mg in sodium chloride 0.9 % 250 mL IVPB     200 mg 500 mL/hr over 30 Minutes Intravenous Once 01/16/19 1617 01/16/19 1824      Inpatient Medications  Scheduled Meds:  amiodarone  200 mg Oral BID   apixaban  5 mg Oral BID   baclofen  10 mg Oral QHS   furosemide  40 mg Intravenous BID   insulin aspart  0-20 Units Subcutaneous TID WC   insulin aspart  0-5 Units Subcutaneous QHS   insulin aspart  8 Units Subcutaneous TID WC   insulin glargine  34 Units Subcutaneous Daily   insulin glargine  6 Units Subcutaneous Once   methylPREDNISolone (SOLU-MEDROL) injection  40 mg Intravenous Q12H   pantoprazole  40 mg Oral Daily   sodium chloride flush  10-40 mL Intracatheter Q12H   sodium chloride flush  3 mL Intravenous Q12H   umeclidinium bromide  1 puff Inhalation Daily   Continuous Infusions:  PRN Meds:.acetaminophen, ALPRAZolam, alum & mag hydroxide-simeth, chlorpheniramine-HYDROcodone, diphenhydrAMINE, guaiFENesin-dextromethorphan, levalbuterol, lip balm, ondansetron **OR** ondansetron (ZOFRAN) IV, oxyCODONE, polyethylene glycol, sodium chloride, sodium chloride flush, zolpidem   Time Spent in minutes  35  See all Orders from today for further details   Jeoffrey Massed M.D on 01/24/2019 at 10:01 AM  To page go to [email or web address] - use universal password  [facility] -  Office  [phone]    Objective:   Vitals:   01/23/19  1400 01/23/19 2000 01/24/19 0359 01/24/19 0830  BP:  110/71 (!) 121/59 123/74  Pulse: 85 72 (!) 53 67  Resp: (!) 25 18 17 18   Temp:  98.2 F (36.8 C) 98 F  (36.7 C) (!) 96.3 F (35.7 C)  TempSrc:  Oral Oral Axillary  SpO2: 96% 93% 99% (!) 87%  Weight:      Height:        Wt Readings from Last 3 Encounters:  01/23/19 120.2 kg  07/27/18 119.3 kg     Intake/Output Summary (Last 24 hours) at 01/24/2019 1001 Last data filed at 01/24/2019 0900 Gross per 24 hour  Intake 348 ml  Output 1350 ml  Net -1002 ml     Physical Exam Gen Exam:Alert awake-not in any distress HEENT:atraumatic, normocephalic Chest: Bibasilar rales CVS:S1S2 regular Abdomen:soft non tender, non distended Extremities:no edema Neurology: Non focal Skin: no rash   Data Review:    CBC Recent Labs  Lab 01/18/19 0155  01/19/19 0215 01/20/19 0142 01/21/19 0155 01/22/19 0148 01/23/19 0150 01/24/19 0220  WBC 8.7  --  8.0 7.3 9.1 9.6 9.3 12.4*  HGB 12.8   < > 13.7 13.6 13.9 13.5 14.0 14.9  HCT 39.0   < > 41.6 40.9 41.1 40.7 42.1 43.6  PLT 274  --  326 348 299 251 177 227  MCV 87.6  --  86.7 85.6 85.3 85.9 85.7 85.5  MCH 28.8  --  28.5 28.5 28.8 28.5 28.5 29.2  MCHC 32.8  --  32.9 33.3 33.8 33.2 33.3 34.2  RDW 17.2*  --  17.0* 16.9* 16.7* 17.0* 17.2* 17.3*  LYMPHSABS 0.6*  --  0.5* 0.4*  --   --   --   --   MONOABS 0.4  --  0.3 0.4  --   --   --   --   EOSABS 0.0  --  0.0 0.0  --   --   --   --   BASOSABS 0.0  --  0.0 0.0  --   --   --   --    < > = values in this interval not displayed.    Chemistries  Recent Labs  Lab 01/18/19 0155  01/20/19 0142 01/21/19 0155 01/22/19 0148 01/23/19 0150 01/24/19 0220  NA 138   < > 137 135 134* 131* 134*  K 4.7   < > 4.3 4.7 4.6 4.5 4.2  CL 103   < > 99 97* 96* 94* 92*  CO2 30   < > 29 24 28 25 29   GLUCOSE 216*   < > 226* 309* 307* 274* 92  BUN 20   < > 23 26* 32* 32* 35*  CREATININE 0.70   < > 0.91 0.92 0.99 0.99 1.02*  CALCIUM 8.2*   < > 8.1* 8.0* 8.0* 7.7* 7.9*  MG 2.2  --   --   --   --   --   --   AST 25   < > 28 27 26 25  37  ALT 18   < > 21 22 25 27  36  ALKPHOS 41   < > 50 61 66 66 68  BILITOT 0.4    < > 0.6 0.4 0.5 0.5 0.8   < > = values in this interval not displayed.   ------------------------------------------------------------------------------------------------------------------ No results for input(s): CHOL, HDL, LDLCALC, TRIG, CHOLHDL, LDLDIRECT in the last 72 hours.  Lab Results  Component Value Date   HGBA1C 7.1 (H) 01/15/2019   ------------------------------------------------------------------------------------------------------------------  No results for input(s): TSH, T4TOTAL, T3FREE, THYROIDAB in the last 72 hours.  Invalid input(s): FREET3 ------------------------------------------------------------------------------------------------------------------ Recent Labs    01/23/19 0150 01/24/19 0220  FERRITIN 164 178    Coagulation profile No results for input(s): INR, PROTIME in the last 168 hours.  Recent Labs    01/23/19 0150 01/24/19 0220  DDIMER 9.46* 7.52*    Cardiac Enzymes No results for input(s): CKMB, TROPONINI, MYOGLOBIN in the last 168 hours.  Invalid input(s): CK ------------------------------------------------------------------------------------------------------------------    Component Value Date/Time   BNP 127.1 (H) 01/19/2019 0215    Micro Results Recent Results (from the past 240 hour(s))  SARS Coronavirus 2 [facility] order, Performed in [facility] hospital lab) Nasopharyngeal Nasopharyngeal Swab     Status: Abnormal   Collection Time: 01/15/19 10:35 AM   Specimen: Nasopharyngeal Swab  Result Value Ref Range Status   SARS Coronavirus 2 POSITIVE (A) NEGATIVE Final    Comment: CRITICAL RESULT CALLED TO, READ BACK BY AND VERIFIED WITH: RN ELIZABETH TAYLOR [phone] FCP (NOTE) If result is NEGATIVE SARS-CoV-2 target nucleic acids are NOT DETECTED. The SARS-CoV-2 RNA is generally detectable in upper and lower  respiratory specimens during the acute phase of infection. The lowest  concentration of SARS-CoV-2 viral copies this  assay can detect is 250  copies / mL. A negative result does not preclude SARS-CoV-2 infection  and should not be used as the sole basis for treatment or other  patient management decisions.  A negative result may occur with  improper specimen collection / handling, submission of specimen other  than nasopharyngeal swab, presence of viral mutation(s) within the  areas targeted by this assay, and inadequate number of viral copies  (<250 copies / mL). A negative result must be combined with clinical  observations, patient history, and epidemiological information. If result is POSITIVE SARS-CoV-2 target nucleic acids are DET ECTED. The SARS-CoV-2 RNA is generally detectable in upper and lower  respiratory specimens during the acute phase of infection.  Positive  results are indicative of active infection with SARS-CoV-2.  Clinical  correlation with patient history and other diagnostic information is  necessary to determine patient infection status.  Positive results do  not rule out bacterial infection or co-infection with other viruses. If result is PRESUMPTIVE POSTIVE SARS-CoV-2 nucleic acids MAY BE PRESENT.   A presumptive positive result was obtained on the submitted specimen  and confirmed on repeat testing.  While 2019 novel coronavirus  (SARS-CoV-2) nucleic acids may be present in the submitted sample  additional confirmatory testing may be necessary for epidemiological  and / or clinical management purposes  to differentiate between  SARS-CoV-2 and other Sarbecovirus currently known to infect humans.  If clinically indicated additional testing with an alternate test  methodology ([phone]) is advised. The SARS-CoV-2 RNA is generally  detectable in upper and lower respiratory specimens during the acute  phase of infection. The expected result is Negative. Fact Sheet for Patients:  BoilerBrush.com.cy Fact Sheet for Healthcare  Providers: https://pope.com/ This test is not yet approved or cleared by the [place] FDA and has been authorized for detection and/or diagnosis of SARS-CoV-2 by FDA under an Emergency Use Authorization (EUA).  This EUA will remain in effect (meaning this test can be used) for the duration of the COVID-19 declaration under Section 564(b)(1) of the Act, 21 U.S.C. section 360bbb-3(b)(1), unless the authorization is terminated or revoked sooner. Performed at [facility], [address] [place], [place] [address]   Blood  Culture (routine x 2)     Status: Abnormal   Collection Time: 01/15/19  3:00 PM   Specimen: BLOOD  Result Value Ref Range Status   Specimen Description BLOOD RIGHT ANTECUBITAL  Final   Special Requests   Final    BOTTLES DRAWN AEROBIC AND ANAEROBIC Blood Culture adequate volume   Culture  Setup Time   Final    ANAEROBIC BOTTLE ONLY GRAM POSITIVE COCCI IN CLUSTERS CRITICAL RESULT CALLED TO, READ BACK BY AND VERIFIED WITH: Carolann Littler PharmD 15:10 01/16/19 ([email or web address])    Culture (A)  Final    STAPHYLOCOCCUS SPECIES (COAGULASE NEGATIVE) THE SIGNIFICANCE OF ISOLATING THIS ORGANISM FROM A SINGLE SET OF BLOOD CULTURES WHEN MULTIPLE SETS ARE DRAWN IS UNCERTAIN. PLEASE NOTIFY THE MICROBIOLOGY DEPARTMENT WITHIN ONE WEEK IF SPECIATION AND SENSITIVITIES ARE REQUIRED. Performed at [facility], [place] [address], [place], [place] [address]    Report Status 01/17/2019 FINAL  Final  Blood Culture ID Panel (Reflexed)     Status: Abnormal   Collection Time: 01/15/19  3:00 PM  Result Value Ref Range Status   Enterococcus species NOT DETECTED NOT DETECTED Final   Listeria monocytogenes NOT DETECTED NOT DETECTED Final   Staphylococcus species DETECTED (A) NOT DETECTED Final    Comment: Methicillin (oxacillin) susceptible coagulase negative staphylococcus. Possible blood culture contaminant (unless isolated from more than one blood culture draw or  clinical case suggests pathogenicity). No antibiotic treatment is indicated for blood  culture contaminants. CRITICAL RESULT CALLED TO, READ BACK BY AND VERIFIED WITH: Carolann Littler PharmD 15:10 01/16/19 ([email or web address])    Staphylococcus aureus (BCID) NOT DETECTED NOT DETECTED Final   Methicillin resistance NOT DETECTED NOT DETECTED Final   Streptococcus species NOT DETECTED NOT DETECTED Final   Streptococcus agalactiae NOT DETECTED NOT DETECTED Final   Streptococcus pneumoniae NOT DETECTED NOT DETECTED Final   Streptococcus pyogenes NOT DETECTED NOT DETECTED Final   Acinetobacter baumannii NOT DETECTED NOT DETECTED Final   Enterobacteriaceae species NOT DETECTED NOT DETECTED Final   Enterobacter cloacae complex NOT DETECTED NOT DETECTED Final   Escherichia coli NOT DETECTED NOT DETECTED Final   Klebsiella oxytoca NOT DETECTED NOT DETECTED Final   Klebsiella pneumoniae NOT DETECTED NOT DETECTED Final   Proteus species NOT DETECTED NOT DETECTED Final   Serratia marcescens NOT DETECTED NOT DETECTED Final   Haemophilus influenzae NOT DETECTED NOT DETECTED Final   Neisseria meningitidis NOT DETECTED NOT DETECTED Final   Pseudomonas aeruginosa NOT DETECTED NOT DETECTED Final   Candida albicans NOT DETECTED NOT DETECTED Final   Candida glabrata NOT DETECTED NOT DETECTED Final   Candida krusei NOT DETECTED NOT DETECTED Final   Candida parapsilosis NOT DETECTED NOT DETECTED Final   Candida tropicalis NOT DETECTED NOT DETECTED Final    Comment: Performed at [facility], [address], [place], [place] [address]  Blood Culture (routine x 2)     Status: None   Collection Time: 01/15/19  7:50 PM   Specimen: BLOOD RIGHT HAND  Result Value Ref Range Status   Specimen Description BLOOD RIGHT HAND  Final   Special Requests   Final    BOTTLES DRAWN AEROBIC AND ANAEROBIC Blood Culture results may not be optimal due to an inadequate volume of blood received in culture bottles   Culture   Final    NO  GROWTH 5 DAYS Performed at [facility], [place] [address], [place], [place] [address]    Report Status 01/20/2019 FINAL  Final  Radiology Reports Ct Angio Chest Pe W And/or Wo Contrast  Result Date: 01/15/2019 CLINICAL DATA:  EMS call for CP and SHOB x 1 week . Pt reported Pain increased with movement. Pt has Dx of A-fib and a ablation the patient . Pt given 324 ASA and one NGT SL . BP 120/70 prior to meds and BP 74/40 after meds. On arrival EMS reported BP 120/70. Pt reports relief of CP From meds. HR 80, CBG 137, 97.2 temp. EXAM: CT ANGIOGRAPHY CHEST CT ABDOMEN AND PELVIS WITH CONTRAST TECHNIQUE: Multidetector CT imaging of the chest was performed using the standard protocol during bolus administration of intravenous contrast. Multiplanar CT image reconstructions and MIPs were obtained to evaluate the vascular anatomy. Multidetector CT imaging of the abdomen and pelvis was performed using the standard protocol during bolus administration of intravenous contrast. CONTRAST:  100mL OMNIPAQUE IOHEXOL 350 MG/ML SOLN COMPARISON:  None. FINDINGS: CTA CHEST FINDINGS Cardiovascular: There is satisfactory opacification of the pulmonary arteries to the segmental level. There is no evidence of a pulmonary embolism. Heart is normal in size. No pericardial effusion. Minimal three-vessel coronary artery calcifications. Great vessels are normal in caliber. Mild aortic atherosclerosis. No dissection. Mediastinum/Nodes: Large hiatal hernia, lying to the right of the lower thoracic spine. Esophagus unremarkable. Trachea widely patent. Normal thyroid. No neck base, axillary, mediastinal or hilar masses or enlarged lymph nodes. Lungs/Pleura: Bilateral, peripheral ground-glass airspace opacities. Slight upper lobe predominance. No lung masses or suspicious nodules. No interstitial thickening. No pleural effusion or pneumothorax. Musculoskeletal: No fracture or acute finding. No osteoblastic or osteolytic lesions. Review  of the MIP images confirms the above findings. CT ABDOMEN and PELVIS FINDINGS Hepatobiliary: Decreased attenuation of the liver consistent with fatty infiltration. No liver mass or focal lesion. Liver normal in size. Normal gallbladder. No bile duct dilation. Pancreas: Unremarkable. No pancreatic ductal dilatation or surrounding inflammatory changes. Spleen: Normal in size without focal abnormality. Adrenals/Urinary Tract: Adrenal glands are unremarkable. Kidneys are normal, without renal calculi, focal lesion, or hydronephrosis. Bladder is unremarkable. Stomach/Bowel: Stomach unremarkable other than the large hiatal hernia. Small bowel and colon are normal in caliber. No wall thickening. No inflammation. There are colonic diverticula mostly along the left colon. No diverticulitis. Normal appendix visualized. Vascular/Lymphatic: Mild aortic atherosclerosis.  No aneurysm. No enlarged lymph nodes. Reproductive: Small partly calcified uterine fibroids. Uterus normal in overall size. No ovarian/adnexal masses. Other: No abdominal wall hernia or abnormality. No abdominopelvic ascites. Musculoskeletal: No fracture or acute finding. No osteoblastic or osteolytic lesions. Review of the MIP images confirms the above findings. IMPRESSION: CTA CHEST 1. No evidence of a pulmonary embolism. 2. Bilateral peripheral ground-glass airspace lung opacities with a slight upper lobe predominance. Findings consistent with multifocal infection. Consider atypical infection including viral infection. Noninfectious lung inflammation may also have this appearance. 3. No other acute findings in the chest. 4. Large hiatal hernia. CT ABDOMEN AND PELVIS 1. No acute findings within the abdomen or pelvis. 2. Hepatic steatosis. 3. Aortic atherosclerosis. 4. Scattered colonic diverticula without diverticulitis. Electronically Signed   By: Amie Portlandavid  Ormond M.D.   On: 01/15/2019 12:01   Ct Abdomen Pelvis W Contrast  Result Date: 01/15/2019 CLINICAL  DATA:  EMS call for CP and SHOB x 1 week . Pt reported Pain increased with movement. Pt has Dx of A-fib and a ablation the patient . Pt given 324 ASA and one NGT SL . BP 120/70 prior to meds and BP 74/40 after meds. On arrival EMS reported BP 120/70. Pt  reports relief of CP From meds. HR 80, CBG 137, 97.2 temp. EXAM: CT ANGIOGRAPHY CHEST CT ABDOMEN AND PELVIS WITH CONTRAST TECHNIQUE: Multidetector CT imaging of the chest was performed using the standard protocol during bolus administration of intravenous contrast. Multiplanar CT image reconstructions and MIPs were obtained to evaluate the vascular anatomy. Multidetector CT imaging of the abdomen and pelvis was performed using the standard protocol during bolus administration of intravenous contrast. CONTRAST:  OMNIPAQUE IOHEXOL 350 MG/ML SOLN COMPARISON:  None. FINDINGS: CTA CHEST FINDINGS Cardiovascular: There is satisfactory opacification of the pulmonary arteries to the segmental level. There is no evidence of a pulmonary embolism. Heart is normal in size. No pericardial effusion. Minimal three-vessel coronary artery calcifications. Great vessels are normal in caliber. Mild aortic atherosclerosis. No dissection. Mediastinum/Nodes: Large hiatal hernia, lying to the right of the lower thoracic spine. Esophagus unremarkable. Trachea widely patent. Normal thyroid. No neck base, axillary, mediastinal or hilar masses or enlarged lymph nodes. Lungs/Pleura: Bilateral, peripheral ground-glass airspace opacities. Slight upper lobe predominance. No lung masses or suspicious nodules. No interstitial thickening. No pleural effusion or pneumothorax. Musculoskeletal: No fracture or acute finding. No osteoblastic or osteolytic lesions. Review of the MIP images confirms the above findings. CT ABDOMEN and PELVIS FINDINGS Hepatobiliary: Decreased attenuation of the liver consistent with fatty infiltration. No liver mass or focal lesion. Liver normal in size. Normal  gallbladder. No bile duct dilation. Pancreas: Unremarkable. No pancreatic ductal dilatation or surrounding inflammatory changes. Spleen: Normal in size without focal abnormality. Adrenals/Urinary Tract: Adrenal glands are unremarkable. Kidneys are normal, without renal calculi, focal lesion, or hydronephrosis. Bladder is unremarkable. Stomach/Bowel: Stomach unremarkable other than the large hiatal hernia. Small bowel and colon are normal in caliber. No wall thickening. No inflammation. There are colonic diverticula mostly along the left colon. No diverticulitis. Normal appendix visualized. Vascular/Lymphatic: Mild aortic atherosclerosis.  No aneurysm. No enlarged lymph nodes. Reproductive: Small partly calcified uterine fibroids. Uterus normal in overall size. No ovarian/adnexal masses. Other: No abdominal wall hernia or abnormality. No abdominopelvic ascites. Musculoskeletal: No fracture or acute finding. No osteoblastic or osteolytic lesions. Review of the MIP images confirms the above findings. IMPRESSION: CTA CHEST 1. No evidence of a pulmonary embolism. 2. Bilateral peripheral ground-glass airspace lung opacities with a slight upper lobe predominance. Findings consistent with multifocal infection. Consider atypical infection including viral infection. Noninfectious lung inflammation may also have this appearance. 3. No other acute findings in the chest. 4. Large hiatal hernia. CT ABDOMEN AND PELVIS 1. No acute findings within the abdomen or pelvis. 2. Hepatic steatosis. 3. Aortic atherosclerosis. 4. Scattered colonic diverticula without diverticulitis. Electronically Signed   By: Amie Portland M.D.   On: 01/15/2019 12:01   Dg Chest Port 1 View  Result Date: 01/15/2019 CLINICAL DATA:  Fall 7/25, chest pain/SOB since then. Hx of stroke, lupus, hypertension, diabetes, coronary artery disease, asthma, afib. Nonsmoker. EXAM: PORTABLE CHEST 1 VIEW COMPARISON:  None. FINDINGS: Cardiac silhouette is mildly enlarged.  No mediastinal or hilar masses. Subtle hazy airspace opacities are noted in the peripheral left mid and lower lung, possibly in the peripheral right mid lung. There are mildly prominent bronchovascular markings diffusely. No convincing pleural effusion.  No pneumothorax. Skeletal structures are grossly intact. IMPRESSION: 1. Subtle hazy airspace opacities in the left mid and lower lung and possibly right peripheral mid lung. Consider multifocal pneumonia if there are consistent clinical findings. Electronically Signed   By: Amie Portland M.D.   On: 01/15/2019 10:26   Dg Chest  Port 1v Same Day  Result Date: 01/18/2019 CLINICAL DATA:  Shortness of breath EXAM: PORTABLE CHEST 1 VIEW COMPARISON:  January 15, 2019 FINDINGS: The study is extremely limited due to positioning. Patchy airspace opacities seen throughout both lungs, left greater than right. The cardiomediastinal silhouette is obscured. IMPRESSION: Extremely limited exam due to patient positioning. Airspace opacities throughout both lungs. Electronically Signed   By: Jonna ClarkBindu  Avutu M.D.   On: 01/18/2019 11:10

## 2019-01-24 NOTE — Plan of Care (Signed)
Patient on 4L oxygen, alert and responsive with no distress noted.  Tolerated her medications with no adverse reactions noted.   Denies pain and discomfort.  Bed in lowest position, callbell within reach and continue to monitor for safety.  Problem: Coping: Goal: Psychosocial and spiritual needs will be supported Outcome: Progressing   Problem: Respiratory: Goal: Will maintain a patent airway Outcome: Progressing   Problem: Education: Goal: Knowledge of General Education information will improve Description: Including pain rating scale, medication(s)/side effects and non-pharmacologic comfort measures Outcome: Progressing   Problem: Activity: Goal: Risk for activity intolerance will decrease Outcome: Progressing   Problem: Nutrition: Goal: Adequate nutrition will be maintained Outcome: Progressing   Problem: Elimination: Goal: Will not experience complications related to bowel motility Outcome: Progressing

## 2019-01-25 LAB — CBC
HCT: 41.3 % (ref 36.0–46.0)
Hemoglobin: 14.1 g/dL (ref 12.0–15.0)
MCH: 29.1 pg (ref 26.0–34.0)
MCHC: 34.1 g/dL (ref 30.0–36.0)
MCV: 85.3 fL (ref 80.0–100.0)
Platelets: 144 10*3/uL — ABNORMAL LOW (ref 150–400)
RBC: 4.84 MIL/uL (ref 3.87–5.11)
RDW: 17.4 % — ABNORMAL HIGH (ref 11.5–15.5)
WBC: 11.5 10*3/uL — ABNORMAL HIGH (ref 4.0–10.5)
nRBC: 0 % (ref 0.0–0.2)

## 2019-01-25 LAB — FERRITIN: Ferritin: 127 ng/mL (ref 11–307)

## 2019-01-25 LAB — D-DIMER, QUANTITATIVE: D-Dimer, Quant: 4.44 ug/mL-FEU — ABNORMAL HIGH (ref 0.00–0.50)

## 2019-01-25 LAB — COMPREHENSIVE METABOLIC PANEL
ALT: 34 U/L (ref 0–44)
AST: 27 U/L (ref 15–41)
Albumin: 2.2 g/dL — ABNORMAL LOW (ref 3.5–5.0)
Alkaline Phosphatase: 67 U/L (ref 38–126)
Anion gap: 9 (ref 5–15)
BUN: 39 mg/dL — ABNORMAL HIGH (ref 8–23)
CO2: 30 mmol/L (ref 22–32)
Calcium: 7.9 mg/dL — ABNORMAL LOW (ref 8.9–10.3)
Chloride: 97 mmol/L — ABNORMAL LOW (ref 98–111)
Creatinine, Ser: 1.04 mg/dL — ABNORMAL HIGH (ref 0.44–1.00)
GFR calc Af Amer: >60 mL/min (ref >60)
GFR calc non Af Amer: 57 mL/min — ABNORMAL LOW (ref >60)
Glucose, Bld: 137 mg/dL — ABNORMAL HIGH (ref 70–99)
Potassium: 4.4 mmol/L (ref 3.5–5.1)
Sodium: 136 mmol/L (ref 135–145)
Total Bilirubin: 0.6 mg/dL (ref 0.3–1.2)
Total Protein: 5.9 g/dL — ABNORMAL LOW (ref 6.5–8.1)

## 2019-01-25 LAB — C-REACTIVE PROTEIN: CRP: <0.8 mg/dL (ref <1.0)

## 2019-01-25 LAB — GLUCOSE, CAPILLARY
Glucose-Capillary: 282 mg/dL — ABNORMAL HIGH (ref 70–99)
Glucose-Capillary: 164 mg/dL — ABNORMAL HIGH (ref 70–99)
Glucose-Capillary: 297 mg/dL — ABNORMAL HIGH (ref 70–99)
Glucose-Capillary: 253 mg/dL — ABNORMAL HIGH (ref 70–99)
Glucose-Capillary: 155 mg/dL — ABNORMAL HIGH (ref 70–99)

## 2019-01-25 MED ORDER — PREDNISONE 20 MG PO TABS
40.0000 mg | ORAL_TABLET | Freq: Every day | ORAL | Status: DC
  Administered 2019-01-26: 40 mg via ORAL
  Filled 2019-01-25 (×2): qty 2

## 2019-01-25 NOTE — Progress Notes (Signed)
Physical Therapy Treatment Patient Details Name: Stacey Burns MRN: [id number] DOB: Mar 20, 1956 Today's Date: 01/25/2019    History of Present Illness Patient is a 63 y.o. female with PMHx of lupus , DM, HTN, A. fib who presented 8/2  with 1 week history of abdominal cramps, chest discomfort and progressive shortness of breath-she was found to have acute hypoxic respiratory failure in the setting of COVID-19 pneumonia.  Hospital course complicated by worsening hypoxemia requiring 15 L high flow oxygen.    PT Comments    The patient is progressing and requires less supplemental O2. Will practice steps next visit as pt has a flight to bed and bath.     Follow Up Recommendations  Home health PT;Supervision for mobility/OOB     Equipment Recommendations  Other (comment)    Recommendations for Other Services       Precautions / Restrictions Precautions Precautions: Fall Precaution Comments: monitor sats    Mobility  Bed Mobility               General bed mobility comments: sitting on bed edge  Transfers Overall transfer level: Needs assistance Equipment used: None Transfers: Sit to/from Stand;Stand Pivot Transfers Sit to Stand: Supervision Stand pivot transfers: Supervision       General transfer comment: Supervision for lines/safety  Ambulation/Gait             General Gait Details: pt declined due to feeling SOB   Stairs             Wheelchair Mobility    Modified Rankin (Stroke Patients Only)       Balance                                            Cognition Arousal/Alertness: Awake/alert                                            Exercises      General Comments        Pertinent Vitals/Pain Pain Assessment: No/denies pain    Home Living                      Prior Function            PT Goals (current goals can now be found in the care plan section) Progress towards PT  goals: Progressing toward goals    Frequency    Min 3X/week      PT Plan Current plan remains appropriate    Co-evaluation              AM-PAC PT "6 Clicks" Mobility   Outcome Measure  Help needed turning from your back to your side while in a flat bed without using bedrails?: None Help needed moving from lying on your back to sitting on the side of a flat bed without using bedrails?: None Help needed moving to and from a bed to a chair (including a wheelchair)?: None Help needed standing up from a chair using your arms (e.g., wheelchair or bedside chair)?: None Help needed to walk in hospital room?: A Little Help needed climbing 3-5 steps with a railing? : A Little 6 Click Score: 22    End of Session Equipment Utilized During Treatment:  Oxygen Activity Tolerance: Patient tolerated treatment well Patient left: in chair;with call bell/phone within reach Nurse Communication: Mobility status PT Visit Diagnosis: Unsteadiness on feet (R26.81);Difficulty in walking, not elsewhere classified (R26.2)     Time: 1210-1224 PT Time Calculation (min) (ACUTE ONLY): 14 min  Charges:  $Therapeutic Activity: 8-22 mins                     [name] PT Acute Rehabilitation Services  Office [phone]      [name] 01/25/2019, 4:41 PM

## 2019-01-25 NOTE — Progress Notes (Signed)
Inpatient Diabetes Program Recommendations  AACE/ADA: New Consensus Statement on Inpatient Glycemic Control (2015)  Target Ranges:  Prepandial:   less than 140 mg/dL      Peak postprandial:   less than 180 mg/dL (1-2 hours)      Critically ill patients:  140 - 180 mg/dL   Lab Results  Component Value Date   GLUCAP 297 (H) 01/25/2019   HGBA1C 7.1 (H) 01/15/2019    Review of Glycemic Control Results for GEARL, KIMBROUGH (MRN [id number]) as of 01/25/2019 11:59  Ref. Range 01/24/2019 08:08 01/24/2019 11:21 01/24/2019 16:22 01/24/2019 21:04 01/25/2019 07:46 01/25/2019 11:32  Glucose-Capillary Latest Ref Range: 70 - 99 mg/dL 204 (H)  Novolog 15 units   Lantus 34 units  Solumedrol 40 280 (H)  Novolog 19 units 282 (H)  Novolog 19 units     Solumedrol 40 349 (H)  Novolog 3 units 164 (H)  Novolog 12 units  Lantus 34 units  Solumedrol 40 (given 0530) 297 (H)    Diabetes history: DM 2 Outpatient Diabetes medications: NPH 2-10 units per SSI + Ozempic 0.25 mg Qweek  Current orders for Inpatient glycemic control:  Lantus 34 units Daily Novolog 0-20 units tid Novolog 0-5 units qhs Novolog 8 units tid meal coverage  Solumedrol 40 mg Q8  Inpatient Diabetes Program Recommendations:    Fasting glucose 164 this am. Consider increasing Novolog meal coverage to 12 units.  Thanks,  Tama Headings RN, MSN, BC-ADM Inpatient Diabetes Coordinator Team Pager [phone] (8a-5p)

## 2019-01-25 NOTE — Care Management (Signed)
Case manager spoke with Ms. Cain Sieve, Case Manager with [employer],[phone], concerning discharge plan and needs for patient. She requested that orders for Home Health, oxygen and DME be faxed to her at [phone], she will then contact the [place] with the [place] who has to agree to pay the [employer]. Per Ms. Laurance Flatten, [facility] is never denied. This CM will have to locate a [place] that will accept their covering services.Case manager faxed orders this evening. Ms. Laurance Flatten will be off on Friday, should patient discharge Friday or weekend, please call Ms. Bell [phone] ext. [phone]. Case manager continuing to follow.     Ricki Miller, RN BSN Case Manager [phone]

## 2019-01-25 NOTE — Progress Notes (Signed)
Physical Therapy Treatment Patient Details Name: Stacey Burns MRN: [id number] DOB: [birth year] Today's Date: 01/25/2019    History of Present Illness Patient is a 63 y.o. female with PMHx of lupus , DM, HTN, A. fib who presented 8/2  with 1 week history of abdominal cramps, chest discomfort and progressive shortness of breath-she was found to have acute hypoxic respiratory failure in the setting of COVID-19 pneumonia.  Hospital course complicated by worsening hypoxemia requiring 15 L high flow oxygen.    PT Comments    The patient  Stepped up and down   X 2 onto a step, rested and then stepped up/down  x 4 . Patient  Placed on 4 L as SaO2 dropped tom 87%, ear lobe probe registers higher than finger. Patient with 4/4 dyspnea. Discussed w/ pt. To go slow and rest after a few steps until she gets to top of her stairs, then sit in a  Chair before ambulating to bedroom. Pt hopeful to go home tomorrow.  rec. 4 wheeled RW  Follow Up Recommendations  Home health PT;Supervision for mobility/OOB     Equipment Recommendations  Rolling walker with 5" wheels    Recommendations for Other Services       Precautions / Restrictions Precautions Precautions: Fall Precaution Comments: monitor sats    Mobility  Bed Mobility               General bed mobility comments: oob  Transfers Overall transfer level: Needs assistance Equipment used: None Transfers: Sit to/from Stand Sit to Stand: Supervision Stand pivot transfers: Supervision       General transfer comment: Supervision for lines/safety  Ambulation/Gait Ambulation/Gait assistance: Supervision           General Gait Details: pt declined due to feeling SOB   Stairs Stairs: Yes Stairs assistance: Min assist Stair Management: One rail Left   General stair comments: 1 step placed close to pt. bed rail available, pt. stepped up /down 2 steps and asked to sit and rest. SaO2 87% on 2 L659finger).  pt. stood and stepped  up and down 4 times with SaO2 92% on 4 L with ear probe monitor.   Wheelchair Mobility    Modified Rankin (Stroke Patients Only)       Balance                                            Cognition Arousal/Alertness: Awake/alert                                            Exercises      General Comments        Pertinent Vitals/Pain Pain Assessment: No/denies pain    Home Living                      Prior Function            PT Goals (current goals can now be found in the care plan section) Progress towards PT goals: Progressing toward goals    Frequency    Min 3X/week      PT Plan Current plan remains appropriate    Co-evaluation              AM-PAC PT "6  Clicks" Mobility   Outcome Measure  Help needed turning from your back to your side while in a flat bed without using bedrails?: None Help needed moving from lying on your back to sitting on the side of a flat bed without using bedrails?: None Help needed moving to and from a bed to a chair (including a wheelchair)?: None Help needed standing up from a chair using your arms (e.g., wheelchair or bedside chair)?: None Help needed to walk in hospital room?: A Little Help needed climbing 3-5 steps with a railing? : A Lot 6 Click Score: 21    End of Session Equipment Utilized During Treatment: Oxygen Activity Tolerance: Patient tolerated treatment well Patient left: with call bell/phone within reach;in bed(EOB) Nurse Communication: Mobility status(cough meds) PT Visit Diagnosis: Unsteadiness on feet (R26.81);Difficulty in walking, not elsewhere classified (R26.2)     Time: 5093-2671 PT Time Calculation (min) (ACUTE ONLY): 42 min  Charges:  $Gait Training: 38-52 mins $Therapeutic Activity: 8-22 mins                     Stacey Burns PT Acute Rehabilitation Services  Office [phone]    Stacey Burns 01/25/2019, 4:51 PM

## 2019-01-25 NOTE — Plan of Care (Signed)
Will continue with plan of care. 

## 2019-01-25 NOTE — Care Management (Signed)
Case manager has left message for Stacey Burns,[phone], ext. [phone], case manager with [employer] to discuss patient's needs at discharge. Will continue to follow. Patient is in custody of [place], CM can not arrange any services without speaking to Ms. Moore.      Ricki Miller, RN BSN  Case Manager [phone]

## 2019-01-25 NOTE — Plan of Care (Signed)
Patient continues on 4L oxygen via [place], alert and responsive with no distress noted.  Tolerated medications with no adverse reactions noted.  Denies pain/discomfort.  Bed in lowest position, callbell within reach and continue to monitor for safety.  Problem: Education: Goal: Knowledge of risk factors and measures for prevention of condition will improve Outcome: Progressing   Problem: Coping: Goal: Psychosocial and spiritual needs will be supported Outcome: Progressing   Problem: Respiratory: Goal: Will maintain a patent airway Outcome: Progressing

## 2019-01-25 NOTE — Progress Notes (Signed)
PROGRESS NOTE                                                                                                                                                                                                             Patient Demographics:    Stacey Burns, is a 63 y.o. female, DOB - [birth year], XWR:[id number]  Outpatient Primary MD for the patient is Tollie Eth   Admit date - 01/15/2019   LOS - 9  Chief Complaint  Patient presents with   Chest Pain       Brief Narrative: Patient is a 63 y.o. female with PMHx of lupus on Imuran, DM, HTN, A. fib on Eliquis who presented with 1 week history of abdominal cramps, chest discomfort and progressive shortness of breath-she was found to have acute hypoxic respiratory failure in the setting of COVID-19 pneumonia.  Hospital course complicated by worsening hypoxemia requiring 15 L high flow oxygen.   Subjective:    Stacey Burns continues to slowly improved-she has now been titrated down to 2 L of oxygen.  She feels better-no shortness of breath   Assessment  & Plan :   Acute Hypoxic Resp Failure due to Covid 19 Viral pneumonia: Continues to slowly improve-now on 2 L of oxygen.  Stop Solu-Medrol-change to prednisone.  She has completed a course of Remdesivir, she is s/p Actemra on 8/6.  Fever: Afebrile  Oxygen requirement: On 2 L (on around 4 L yesterday)   COVID-19 Labs: Recent Labs    01/23/19 0150 01/24/19 0220 01/25/19 0220  DDIMER 9.46* 7.52* 4.44*  FERRITIN 164 178 127  CRP 1.1* 0.8 <0.8    COVID-19 Medications: Steroids: Decadron 8/3-8/5, Solu-Medrol 8/5>> 8/12, prednisone 8/12>> Remdesivir: 8/3>>8/7 Actemra: 8/6x1 Convalescent Plasma: N/A Research Studies: N/A  Other medications: Diuretics: Lasix-change to twice daily dosing today. Antibiotics: None  A. fib/atrial fibrillation with RVR with spells of bradycardia: Maintaining sinus  rhythm-continues to have episodes of bradycardia overnight-probably related to underlying OSA.  Continue amiodarone twice daily dosing, continue Eliquis.  Have asked patient to make an appointment with primary cardiologist on discharge.  Chronic diastolic heart failure: Compensated-I/O's inaccurate, weight decreased to 264 pounds.  Continue Lasix-as well as chronic lower extremity edema that is unchanged.   HTN: Controlled-monitor off antihypertensives  Insulin-dependent DM-2: CBGs slowly improving-since changing from Solu-Medrol  to prednisone today-continue Lantus 34 units daily, 8 units of NovoLog with meals and resistant SSI.  Follow and adjust.    Lupus: Appears to be stable-apparently was diagnosed with pancolitis in 2015-continue to hold Imuran-see above regarding discussion about Actemra.  COPD/asthma: No wheezing-appears stable-continue as needed bronchodilators      OSA: Not on CPAP due to COVID-on high flow oxygen  Coagulase-negative Staphylococcus bacteremia times 1 out of 2 cultures: Likely contaminant-no indication of infection  Obesity: Estimated body mass index is 45.49 kg/m as calculated from the following:   Height as of this encounter: 5\' 4"  (1.626 m).   Weight as of this encounter: 120.2 kg.   ABG:    Component Value Date/Time   PHART 7.498 (H) 01/18/2019 1018   PCO2ART 33.5 01/18/2019 1018   PO2ART 46.0 (L) 01/18/2019 1018   HCO3 26.0 01/18/2019 1018   TCO2 27 01/18/2019 1018   O2SAT 86.0 01/18/2019 1018      Condition -stable  Family Communication  : Spoke with patient's brother over the phone  Code Status :  Full Code  Diet :  Diet Order            Diet Carb Modified Fluid consistency: Thin; Room service appropriate? Yes  Diet effective now               Disposition Plan  :  Remain hospitalized-Home in the next 1-2 days  Consults  :  None  Procedures  :  None  GI prophylaxis: PPI  DVT Prophylaxis  : Eliquis  Lab Results  Component  Value Date   PLT 144 (L) 01/25/2019    Antibiotics  :    Anti-infectives (From admission, onward)   Start     Dose/Rate Route Frequency Ordered Stop   01/17/19 1700  remdesivir 100 mg in sodium chloride 0.9 % 250 mL IVPB     100 mg 500 mL/hr over 30 Minutes Intravenous Every 24 hours 01/16/19 1617 01/20/19 1745   01/16/19 1700  remdesivir 200 mg in sodium chloride 0.9 % 250 mL IVPB     200 mg 500 mL/hr over 30 Minutes Intravenous Once 01/16/19 1617 01/16/19 1824      Inpatient Medications  Scheduled Meds:  amiodarone  200 mg Oral BID   apixaban  5 mg Oral BID   baclofen  10 mg Oral QHS   furosemide  40 mg Intravenous BID   insulin aspart  0-20 Units Subcutaneous TID WC   insulin aspart  0-5 Units Subcutaneous QHS   insulin aspart  8 Units Subcutaneous TID WC   insulin glargine  34 Units Subcutaneous Daily   insulin glargine  6 Units Subcutaneous Once   methylPREDNISolone (SOLU-MEDROL) injection  40 mg Intravenous Q12H   pantoprazole  40 mg Oral Daily   sodium chloride flush  10-40 mL Intracatheter Q12H   sodium chloride flush  3 mL Intravenous Q12H   umeclidinium bromide  1 puff Inhalation Daily   Continuous Infusions:  PRN Meds:.acetaminophen, ALPRAZolam, alum & mag hydroxide-simeth, chlorpheniramine-HYDROcodone, diphenhydrAMINE, guaiFENesin-dextromethorphan, levalbuterol, lip balm, ondansetron **OR** ondansetron (ZOFRAN) IV, oxyCODONE, polyethylene glycol, sodium chloride, sodium chloride flush, zolpidem   Time Spent in minutes  25  See all Orders from today for further details   Oren Binet M.D on 01/25/2019 at 12:28 PM  To page go to [email or web address] - use universal password  [facility] -  Office  [phone]    Objective:   Vitals:   01/24/19 0359 01/24/19 0830 01/24/19 1939  01/25/19 0408  BP: (!) 121/59 123/74 104/68 123/60  Pulse: (!) 53 67 81 (!) 50  Resp: 17 18 (!) 22 (!) 23  Temp: 98 F (36.7 C) (!) 96.3 F (35.7 C) 98.3 F  (36.8 C) 98.3 F (36.8 C)  TempSrc: Oral Axillary Oral Oral  SpO2: 99% (!) 87% 92% 99%  Weight:      Height:        Wt Readings from Last 3 Encounters:  01/23/19 120.2 kg  07/27/18 119.3 kg     Intake/Output Summary (Last 24 hours) at 01/25/2019 1228 Last data filed at 01/24/2019 2012 Gross per 24 hour  Intake 240 ml  Output --  Net 240 ml     Physical Exam Gen Exam:Alert awake-not in any distress HEENT:atraumatic, normocephalic Chest: B/L clear to auscultation anteriorly CVS:S1S2 regular Abdomen:soft non tender, non distended Extremities:1+ edema Neurology: Non focal Skin: no rash   Data Review:    CBC Recent Labs  Lab 01/19/19 0215 01/20/19 0142 01/21/19 0155 01/22/19 0148 01/23/19 0150 01/24/19 0220 01/25/19 0220  WBC 8.0 7.3 9.1 9.6 9.3 12.4* 11.5*  HGB 13.7 13.6 13.9 13.5 14.0 14.9 14.1  HCT 41.6 40.9 41.1 40.7 42.1 43.6 41.3  PLT 326 348 299 251 177 227 144*  MCV 86.7 85.6 85.3 85.9 85.7 85.5 85.3  MCH 28.5 28.5 28.8 28.5 28.5 29.2 29.1  MCHC 32.9 33.3 33.8 33.2 33.3 34.2 34.1  RDW 17.0* 16.9* 16.7* 17.0* 17.2* 17.3* 17.4*  LYMPHSABS 0.5* 0.4*  --   --   --   --   --   MONOABS 0.3 0.4  --   --   --   --   --   EOSABS 0.0 0.0  --   --   --   --   --   BASOSABS 0.0 0.0  --   --   --   --   --     Chemistries  Recent Labs  Lab 01/21/19 0155 01/22/19 0148 01/23/19 0150 01/24/19 0220 01/25/19 0220  NA 135 134* 131* 134* 136  K 4.7 4.6 4.5 4.2 4.4  CL 97* 96* 94* 92* 97*  CO2 24 28 25 29 30   GLUCOSE 309* 307* 274* 92 137*  BUN 26* 32* 32* 35* 39*  CREATININE 0.92 0.99 0.99 1.02* 1.04*  CALCIUM 8.0* 8.0* 7.7* 7.9* 7.9*  AST 27 26 25  37 27  ALT 22 25 27  36 34  ALKPHOS 61 66 66 68 67  BILITOT 0.4 0.5 0.5 0.8 0.6   ------------------------------------------------------------------------------------------------------------------ No results for input(s): CHOL, HDL, LDLCALC, TRIG, CHOLHDL, LDLDIRECT in the last 72 hours.  Lab Results   Component Value Date   HGBA1C 7.1 (H) 01/15/2019   ------------------------------------------------------------------------------------------------------------------ No results for input(s): TSH, T4TOTAL, T3FREE, THYROIDAB in the last 72 hours.  Invalid input(s): FREET3 ------------------------------------------------------------------------------------------------------------------ Recent Labs    01/24/19 0220 01/25/19 0220  FERRITIN 178 127    Coagulation profile No results for input(s): INR, PROTIME in the last 168 hours.  Recent Labs    01/24/19 0220 01/25/19 0220  DDIMER 7.52* 4.44*    Cardiac Enzymes No results for input(s): CKMB, TROPONINI, MYOGLOBIN in the last 168 hours.  Invalid input(s): CK ------------------------------------------------------------------------------------------------------------------    Component Value Date/Time   BNP 127.1 (H) 01/19/2019 0215    Micro Results Recent Results (from the past 240 hour(s))  Blood Culture (routine x 2)     Status: Abnormal   Collection Time: 01/15/19  3:00 PM   Specimen: BLOOD  Result Value Ref Range Status   Specimen Description BLOOD RIGHT ANTECUBITAL  Final   Special Requests   Final    BOTTLES DRAWN AEROBIC AND ANAEROBIC Blood Culture adequate volume   Culture  Setup Time   Final    ANAEROBIC BOTTLE ONLY GRAM POSITIVE COCCI IN CLUSTERS CRITICAL RESULT CALLED TO, READ BACK BY AND VERIFIED WITH: Lois HuxleyK. Patton PharmD 15:10 01/16/19 ([email or web address])    Culture (A)  Final    STAPHYLOCOCCUS SPECIES (COAGULASE NEGATIVE) THE SIGNIFICANCE OF ISOLATING THIS ORGANISM FROM A SINGLE SET OF BLOOD CULTURES WHEN MULTIPLE SETS ARE DRAWN IS UNCERTAIN. PLEASE NOTIFY THE MICROBIOLOGY DEPARTMENT WITHIN ONE WEEK IF SPECIATION AND SENSITIVITIES ARE REQUIRED. Performed at [facility], [address] [place], [place] [address]    Report Status 01/17/2019 FINAL  Final  Blood Culture ID Panel (Reflexed)     Status: Abnormal    Collection Time: 01/15/19  3:00 PM  Result Value Ref Range Status   Enterococcus species NOT DETECTED NOT DETECTED Final   Listeria monocytogenes NOT DETECTED NOT DETECTED Final   Staphylococcus species DETECTED (A) NOT DETECTED Final    Comment: Methicillin (oxacillin) susceptible coagulase negative staphylococcus. Possible blood culture contaminant (unless isolated from more than one blood culture draw or clinical case suggests pathogenicity). No antibiotic treatment is indicated for blood  culture contaminants. CRITICAL RESULT CALLED TO, READ BACK BY AND VERIFIED WITH: Lois HuxleyK. Patton PharmD 15:10 01/16/19 ([email or web address])    Staphylococcus aureus (BCID) NOT DETECTED NOT DETECTED Final   Methicillin resistance NOT DETECTED NOT DETECTED Final   Streptococcus species NOT DETECTED NOT DETECTED Final   Streptococcus agalactiae NOT DETECTED NOT DETECTED Final   Streptococcus pneumoniae NOT DETECTED NOT DETECTED Final   Streptococcus pyogenes NOT DETECTED NOT DETECTED Final   Acinetobacter baumannii NOT DETECTED NOT DETECTED Final   Enterobacteriaceae species NOT DETECTED NOT DETECTED Final   Enterobacter cloacae complex NOT DETECTED NOT DETECTED Final   Escherichia coli NOT DETECTED NOT DETECTED Final   Klebsiella oxytoca NOT DETECTED NOT DETECTED Final   Klebsiella pneumoniae NOT DETECTED NOT DETECTED Final   Proteus species NOT DETECTED NOT DETECTED Final   Serratia marcescens NOT DETECTED NOT DETECTED Final   Haemophilus influenzae NOT DETECTED NOT DETECTED Final   Neisseria meningitidis NOT DETECTED NOT DETECTED Final   Pseudomonas aeruginosa NOT DETECTED NOT DETECTED Final   Candida albicans NOT DETECTED NOT DETECTED Final   Candida glabrata NOT DETECTED NOT DETECTED Final   Candida krusei NOT DETECTED NOT DETECTED Final   Candida parapsilosis NOT DETECTED NOT DETECTED Final   Candida tropicalis NOT DETECTED NOT DETECTED Final    Comment: Performed at [facility], [address]  [place], [place] [address]  Blood Culture (routine x 2)     Status: None   Collection Time: 01/15/19  7:50 PM   Specimen: BLOOD RIGHT HAND  Result Value Ref Range Status   Specimen Description BLOOD RIGHT HAND  Final   Special Requests   Final    BOTTLES DRAWN AEROBIC AND ANAEROBIC Blood Culture results may not be optimal due to an inadequate volume of blood received in culture bottles   Culture   Final    NO GROWTH 5 DAYS Performed at [facility], [address] [place], [place] [address]    Report Status 01/20/2019 FINAL  Final    Radiology Reports Ct Angio Chest Pe W And/or Wo Contrast  Result Date: 01/15/2019 CLINICAL DATA:  EMS call for CP and  SHOB x 1 week . Pt reported Pain increased with movement. Pt has Dx of A-fib and a ablation the patient . Pt given 324 ASA and one NGT SL . BP 120/70 prior to meds and BP 74/40 after meds. On arrival EMS reported BP 120/70. Pt reports relief of CP From meds. HR 80, CBG 137, 97.2 temp. EXAM: CT ANGIOGRAPHY CHEST CT ABDOMEN AND PELVIS WITH CONTRAST TECHNIQUE: Multidetector CT imaging of the chest was performed using the standard protocol during bolus administration of intravenous contrast. Multiplanar CT image reconstructions and MIPs were obtained to evaluate the vascular anatomy. Multidetector CT imaging of the abdomen and pelvis was performed using the standard protocol during bolus administration of intravenous contrast. CONTRAST:  100mL OMNIPAQUE IOHEXOL 350 MG/ML SOLN COMPARISON:  None. FINDINGS: CTA CHEST FINDINGS Cardiovascular: There is satisfactory opacification of the pulmonary arteries to the segmental level. There is no evidence of a pulmonary embolism. Heart is normal in size. No pericardial effusion. Minimal three-vessel coronary artery calcifications. Great vessels are normal in caliber. Mild aortic atherosclerosis. No dissection. Mediastinum/Nodes: Large hiatal hernia, lying to the right of the lower thoracic spine. Esophagus  unremarkable. Trachea widely patent. Normal thyroid. No neck base, axillary, mediastinal or hilar masses or enlarged lymph nodes. Lungs/Pleura: Bilateral, peripheral ground-glass airspace opacities. Slight upper lobe predominance. No lung masses or suspicious nodules. No interstitial thickening. No pleural effusion or pneumothorax. Musculoskeletal: No fracture or acute finding. No osteoblastic or osteolytic lesions. Review of the MIP images confirms the above findings. CT ABDOMEN and PELVIS FINDINGS Hepatobiliary: Decreased attenuation of the liver consistent with fatty infiltration. No liver mass or focal lesion. Liver normal in size. Normal gallbladder. No bile duct dilation. Pancreas: Unremarkable. No pancreatic ductal dilatation or surrounding inflammatory changes. Spleen: Normal in size without focal abnormality. Adrenals/Urinary Tract: Adrenal glands are unremarkable. Kidneys are normal, without renal calculi, focal lesion, or hydronephrosis. Bladder is unremarkable. Stomach/Bowel: Stomach unremarkable other than the large hiatal hernia. Small bowel and colon are normal in caliber. No wall thickening. No inflammation. There are colonic diverticula mostly along the left colon. No diverticulitis. Normal appendix visualized. Vascular/Lymphatic: Mild aortic atherosclerosis.  No aneurysm. No enlarged lymph nodes. Reproductive: Small partly calcified uterine fibroids. Uterus normal in overall size. No ovarian/adnexal masses. Other: No abdominal wall hernia or abnormality. No abdominopelvic ascites. Musculoskeletal: No fracture or acute finding. No osteoblastic or osteolytic lesions. Review of the MIP images confirms the above findings. IMPRESSION: CTA CHEST 1. No evidence of a pulmonary embolism. 2. Bilateral peripheral ground-glass airspace lung opacities with a slight upper lobe predominance. Findings consistent with multifocal infection. Consider atypical infection including viral infection. Noninfectious lung  inflammation may also have this appearance. 3. No other acute findings in the chest. 4. Large hiatal hernia. CT ABDOMEN AND PELVIS 1. No acute findings within the abdomen or pelvis. 2. Hepatic steatosis. 3. Aortic atherosclerosis. 4. Scattered colonic diverticula without diverticulitis. Electronically Signed   By: Amie Portlandavid  Ormond M.D.   On: 01/15/2019 12:01   Ct Abdomen Pelvis W Contrast  Result Date: 01/15/2019 CLINICAL DATA:  EMS call for CP and SHOB x 1 week . Pt reported Pain increased with movement. Pt has Dx of A-fib and a ablation the patient . Pt given 324 ASA and one NGT SL . BP 120/70 prior to meds and BP 74/40 after meds. On arrival EMS reported BP 120/70. Pt reports relief of CP From meds. HR 80, CBG 137, 97.2 temp. EXAM: CT ANGIOGRAPHY CHEST CT ABDOMEN AND PELVIS WITH CONTRAST  TECHNIQUE: Multidetector CT imaging of the chest was performed using the standard protocol during bolus administration of intravenous contrast. Multiplanar CT image reconstructions and MIPs were obtained to evaluate the vascular anatomy. Multidetector CT imaging of the abdomen and pelvis was performed using the standard protocol during bolus administration of intravenous contrast. CONTRAST:  100mL OMNIPAQUE IOHEXOL 350 MG/ML SOLN COMPARISON:  None. FINDINGS: CTA CHEST FINDINGS Cardiovascular: There is satisfactory opacification of the pulmonary arteries to the segmental level. There is no evidence of a pulmonary embolism. Heart is normal in size. No pericardial effusion. Minimal three-vessel coronary artery calcifications. Great vessels are normal in caliber. Mild aortic atherosclerosis. No dissection. Mediastinum/Nodes: Large hiatal hernia, lying to the right of the lower thoracic spine. Esophagus unremarkable. Trachea widely patent. Normal thyroid. No neck base, axillary, mediastinal or hilar masses or enlarged lymph nodes. Lungs/Pleura: Bilateral, peripheral ground-glass airspace opacities. Slight upper lobe predominance. No  lung masses or suspicious nodules. No interstitial thickening. No pleural effusion or pneumothorax. Musculoskeletal: No fracture or acute finding. No osteoblastic or osteolytic lesions. Review of the MIP images confirms the above findings. CT ABDOMEN and PELVIS FINDINGS Hepatobiliary: Decreased attenuation of the liver consistent with fatty infiltration. No liver mass or focal lesion. Liver normal in size. Normal gallbladder. No bile duct dilation. Pancreas: Unremarkable. No pancreatic ductal dilatation or surrounding inflammatory changes. Spleen: Normal in size without focal abnormality. Adrenals/Urinary Tract: Adrenal glands are unremarkable. Kidneys are normal, without renal calculi, focal lesion, or hydronephrosis. Bladder is unremarkable. Stomach/Bowel: Stomach unremarkable other than the large hiatal hernia. Small bowel and colon are normal in caliber. No wall thickening. No inflammation. There are colonic diverticula mostly along the left colon. No diverticulitis. Normal appendix visualized. Vascular/Lymphatic: Mild aortic atherosclerosis.  No aneurysm. No enlarged lymph nodes. Reproductive: Small partly calcified uterine fibroids. Uterus normal in overall size. No ovarian/adnexal masses. Other: No abdominal wall hernia or abnormality. No abdominopelvic ascites. Musculoskeletal: No fracture or acute finding. No osteoblastic or osteolytic lesions. Review of the MIP images confirms the above findings. IMPRESSION: CTA CHEST 1. No evidence of a pulmonary embolism. 2. Bilateral peripheral ground-glass airspace lung opacities with a slight upper lobe predominance. Findings consistent with multifocal infection. Consider atypical infection including viral infection. Noninfectious lung inflammation may also have this appearance. 3. No other acute findings in the chest. 4. Large hiatal hernia. CT ABDOMEN AND PELVIS 1. No acute findings within the abdomen or pelvis. 2. Hepatic steatosis. 3. Aortic atherosclerosis. 4.  Scattered colonic diverticula without diverticulitis. Electronically Signed   By: Amie Portlandavid  Ormond M.D.   On: 01/15/2019 12:01   Dg Chest Port 1 View  Result Date: 01/15/2019 CLINICAL DATA:  Fall 7/25, chest pain/SOB since then. Hx of stroke, lupus, hypertension, diabetes, coronary artery disease, asthma, afib. Nonsmoker. EXAM: PORTABLE CHEST 1 VIEW COMPARISON:  None. FINDINGS: Cardiac silhouette is mildly enlarged. No mediastinal or hilar masses. Subtle hazy airspace opacities are noted in the peripheral left mid and lower lung, possibly in the peripheral right mid lung. There are mildly prominent bronchovascular markings diffusely. No convincing pleural effusion.  No pneumothorax. Skeletal structures are grossly intact. IMPRESSION: 1. Subtle hazy airspace opacities in the left mid and lower lung and possibly right peripheral mid lung. Consider multifocal pneumonia if there are consistent clinical findings. Electronically Signed   By: Amie Portlandavid  Ormond M.D.   On: 01/15/2019 10:26   Dg Chest Port 1v Same Day  Result Date: 01/18/2019 CLINICAL DATA:  Shortness of breath EXAM: PORTABLE CHEST 1 VIEW COMPARISON:  January 15, 2019 FINDINGS: The study is extremely limited due to positioning. Patchy airspace opacities seen throughout both lungs, left greater than right. The cardiomediastinal silhouette is obscured. IMPRESSION: Extremely limited exam due to patient positioning. Airspace opacities throughout both lungs. Electronically Signed   By: Jonna Clark M.D.   On: 01/18/2019 11:10

## 2019-01-26 LAB — COMPREHENSIVE METABOLIC PANEL
ALT: 34 U/L (ref 0–44)
AST: 27 U/L (ref 15–41)
Albumin: 2.3 g/dL — ABNORMAL LOW (ref 3.5–5.0)
Alkaline Phosphatase: 62 U/L (ref 38–126)
Anion gap: 13 (ref 5–15)
BUN: 39 mg/dL — ABNORMAL HIGH (ref 8–23)
CO2: 29 mmol/L (ref 22–32)
Calcium: 7.7 mg/dL — ABNORMAL LOW (ref 8.9–10.3)
Chloride: 94 mmol/L — ABNORMAL LOW (ref 98–111)
Creatinine, Ser: 0.95 mg/dL (ref 0.44–1.00)
GFR calc Af Amer: >60 mL/min (ref >60)
GFR calc non Af Amer: >60 mL/min (ref >60)
Glucose, Bld: 104 mg/dL — ABNORMAL HIGH (ref 70–99)
Potassium: 4 mmol/L (ref 3.5–5.1)
Sodium: 136 mmol/L (ref 135–145)
Total Bilirubin: 0.7 mg/dL (ref 0.3–1.2)
Total Protein: 5.6 g/dL — ABNORMAL LOW (ref 6.5–8.1)

## 2019-01-26 LAB — CBC
HCT: 42.1 % (ref 36.0–46.0)
Hemoglobin: 14 g/dL (ref 12.0–15.0)
MCH: 28.7 pg (ref 26.0–34.0)
MCHC: 33.3 g/dL (ref 30.0–36.0)
MCV: 86.4 fL (ref 80.0–100.0)
Platelets: 131 10*3/uL — ABNORMAL LOW (ref 150–400)
RBC: 4.87 MIL/uL (ref 3.87–5.11)
RDW: 17.6 % — ABNORMAL HIGH (ref 11.5–15.5)
WBC: 12.2 10*3/uL — ABNORMAL HIGH (ref 4.0–10.5)
nRBC: 0 % (ref 0.0–0.2)

## 2019-01-26 LAB — FERRITIN: Ferritin: 151 ng/mL (ref 11–307)

## 2019-01-26 LAB — C-REACTIVE PROTEIN: CRP: <0.8 mg/dL (ref <1.0)

## 2019-01-26 LAB — GLUCOSE, CAPILLARY
Glucose-Capillary: 115 mg/dL — ABNORMAL HIGH (ref 70–99)
Glucose-Capillary: 211 mg/dL — ABNORMAL HIGH (ref 70–99)
Glucose-Capillary: 200 mg/dL — ABNORMAL HIGH (ref 70–99)

## 2019-01-26 LAB — D-DIMER, QUANTITATIVE: D-Dimer, Quant: 3.2 ug/mL-FEU — ABNORMAL HIGH (ref 0.00–0.50)

## 2019-01-26 NOTE — Progress Notes (Signed)
Patient had a 1.81 seconds pause and then immediately reverted back to normal sinus with HR in the 60s.  V/S taken on 100/61, 21, 68, 96% and remains asymptomatic.

## 2019-01-26 NOTE — TOC Progression Note (Signed)
Transition of Care [facility]) - Progression Note    Patient Details  Name: Stacey Burns MRN: [id number] Date of Birth: [birth year]  Transition of Care [facility]) CM/SW Contact  Ninfa Meeker, RN Phone Number: [phone] (working remotely) 01/26/2019, 10:42 AM  Clinical Narrative:  Case Manager has called referral to Adela Lank, [facility] Liaison. CM will email him the letter of authorization from [employer] when it has been received.( [email or web address].) Oxygen concentrator and tanks will be delivered to patient's residence. CM will notify MD when everything has been approved for discharge home.    Expected Discharge Plan: [place] Barriers to Discharge: No Barriers Identified  Expected Discharge Plan and Services  Expected Discharge Plan: [place]   Discharge Planning Services: CM Consult Post Acute Care Choice: Home Health, Durable Medical Equipment                   DME Arranged: [place] rolling, Oxygen DME Agency: [place] Date DME Agency Contacted: 01/26/19 Time DME Agency Contacted: [phone] Representative spoke with at DME Agency: Jeneen Rinks HH Arranged: PT, OT [facility] Agency: [place] Date [place]: 01/26/19 Time [place]: 1034 Representative spoke with at [place]: [place] Interventions    Readmission Risk Interventions No flowsheet data found.

## 2019-01-26 NOTE — Progress Notes (Signed)
Occupational Therapy Treatment Patient Details Name: Stacey Burns MRN: [id number] DOB: 01-02-1956 Today's Date: 01/26/2019    History of present illness Patient is a 63 y.o. female with PMHx of lupus , DM, HTN, A. fib who presented 8/2  with 1 week history of abdominal cramps, chest discomfort and progressive shortness of breath-she was found to have acute hypoxic respiratory failure in the setting of COVID-19 pneumonia.  Hospital course complicated by worsening hypoxemia requiring 15 L high flow oxygen.   OT comments  Pt progressing towards OT goals. Issued theraband and instructed pt in UB/LB HEP. Pt return demonstrating exercises with min cues for technique. Pt requiring seated rest between each due to fatigue, good use of deep breathing techniques throughout. Pt on 1L O2 with O2 sats >92% with seated activity. Recommend continue per POC.   Follow Up Recommendations  Home health OT;Supervision/Assistance - 24 hour    Equipment Recommendations  3 in 1 bedside commode;Tub/shower seat(3:1 vs shower seat)          Precautions / Restrictions Precautions Precautions: Fall Precaution Comments: monitor sats Restrictions Weight Bearing Restrictions: No       Mobility Bed Mobility               General bed mobility comments: OOB in recliner                                                                   ADL either performed or assessed with clinical judgement   ADL Overall ADL's : Needs assistance/impaired                                       General ADL Comments: focus of session on UB/LB exercise for continued strengthening and building endurance; pt participating in seated theraband and LB exercise, taking rest breaks PRN; reports fatigued post activity completion                       Cognition Arousal/Alertness: Awake/alert Behavior During Therapy: WFL for tasks assessed/performed Overall Cognitive  Status: Within Functional Limits for tasks assessed                                          Exercises Exercises: General Upper Extremity;General Lower Extremity General Exercises - Upper Extremity Shoulder Flexion: AROM;Both;10 reps;Theraband Theraband Level (Shoulder Flexion): Level 1 (Yellow) Shoulder Horizontal ABduction: AROM;Both;10 reps;Theraband Theraband Level (Shoulder Horizontal Abduction): Level 1 (Yellow) Shoulder Horizontal ADduction: AROM;Both;10 reps;Theraband Theraband Level (Shoulder Horizontal Adduction): Level 1 (Yellow) Elbow Flexion: AROM;Both;10 reps;Theraband Theraband Level (Elbow Flexion): Level 1 (Yellow) Elbow Extension: AROM;Both;10 reps;Theraband Theraband Level (Elbow Extension): Level 1 (Yellow) General Exercises - Lower Extremity Long Arc Quad: AROM;Both;10 reps;Seated Hip Flexion/Marching: AROM;Both;10 reps;Seated   Shoulder Instructions       General Comments      Pertinent Vitals/ Pain       Pain Assessment: No/denies pain  Home Living  Prior Functioning/Environment              Frequency  Min 3X/week        Progress Toward Goals  OT Goals(current goals can now be found in the care plan section)  Progress towards OT goals: Progressing toward goals  Acute Rehab OT Goals Patient Stated Goal: to go to the homeplace where ther are no steps OT Goal Formulation: With patient Time For Goal Achievement: 02/05/19 Potential to Achieve Goals: Good  Plan Discharge plan remains appropriate    Co-evaluation                 AM-PAC OT "6 Clicks" Daily Activity     Outcome Measure   Help from another person eating meals?: None Help from another person taking care of personal grooming?: None Help from another person toileting, which includes using toliet, bedpan, or urinal?: A Little Help from another person bathing (including washing, rinsing,  drying)?: A Little Help from another person to put on and taking off regular upper body clothing?: None Help from another person to put on and taking off regular lower body clothing?: A Little 6 Click Score: 21    End of Session Equipment Utilized During Treatment: Oxygen  OT Visit Diagnosis: Unsteadiness on feet (R26.81);Muscle weakness (generalized) (M62.81)(decreased activity tolerance)   Activity Tolerance Patient tolerated treatment well   Patient Left in chair;with call bell/phone within reach   Nurse Communication Mobility status        Time: 3810-1751 OT Time Calculation (min): 25 min  Charges: OT General Charges $OT Visit: 1 Visit OT Treatments $Therapeutic Activity: 23-37 mins  Lou Cal, OT [facility] Pager [phone] Office [phone]    Raymondo Band 01/26/2019, 4:04 PM

## 2019-01-26 NOTE — Progress Notes (Signed)
PROGRESS NOTE                                                                                                                                                                                                             Patient Demographics:    Stacey Burns, is a 63 y.o. female, DOB - 05-11-1956, ZOX:[id number]  Outpatient Primary MD for the patient is Tollie EthChurch, Shea N, PA-C   Admit date - 01/15/2019   LOS - 10  Chief Complaint  Patient presents with  . Chest Pain       Brief Narrative: Patient is a 63 y.o. female with PMHx of lupus on Imuran, DM, HTN, A. fib on Eliquis who presented with 1 week history of abdominal cramps, chest discomfort and progressive shortness of breath-she was found to have acute hypoxic respiratory failure in the setting of COVID-19 pneumonia.  Hospital course complicated by worsening hypoxemia requiring 15 L high flow oxygen.   Subjective:    Stacey Burns feels better-Down to 1 L of oxygen.   Assessment  & Plan :   Acute Hypoxic Resp Failure due to Covid 19 Viral pneumonia: Improving-Down to 1 L of oxygen.  Continue prednisone.  Is s/p Actemra on 8/6, has completed a course of Remdesivir.  Awaiting approval from the prison services ([employer] [facility]) for home health services before actual discharge.  Fever: Afebrile  Oxygen requirement: On 1 L (on around 2L yesterday)   COVID-19 Labs: Recent Labs    01/24/19 0220 01/25/19 0220 01/26/19 0120  DDIMER 7.52* 4.44* 3.20*  FERRITIN 178 127 151  CRP 0.8 <0.8 <0.8    COVID-19 Medications: Steroids: Decadron 8/3-8/5, Solu-Medrol 8/5>> 8/12, prednisone 8/12>> Remdesivir: 8/3>>8/7 Actemra: 8/6x1 Convalescent Plasma: N/A Research Studies: N/A  Other medications: Diuretics: Lasix-change to twice daily dosing today. Antibiotics: None  A. fib/atrial fibrillation with RVR with spells of bradycardia: Maintaining sinus  rhythm-continues to have episodes of bradycardia nonconducted PACs overnight-probably related to underlying OSA.  Continue amiodarone twice daily dosing, continue Eliquis (Case has been discussed with cardiology multiple times on the phone over this hospital course).  Have asked patient to make an appointment with primary cardiologist on discharge.  Chronic diastolic heart failure: Compensated-I/O's inaccurate, weight decreased to 264 pounds.  Continue Lasix-as well as chronic lower extremity edema that is unchanged.   HTN: Controlled-monitor  off antihypertensives  Insulin-dependent DM-2: CBG stable-continue Lantus 34 units daily, 8 units of Lantus and SSI with meals.  Follow and adjust.   Lupus: Appears to be stable-apparently was diagnosed with pancolitis in 2015-continue to hold Imuran-see above regarding discussion about Actemra.  COPD/asthma: No wheezing-appears stable-continue as needed bronchodilators      OSA: Not on CPAP due to COVID-on high flow oxygen  Coagulase-negative Staphylococcus bacteremia times 1 out of 2 cultures: Likely contaminant-no indication of infection  Obesity: Estimated body mass index is 45.49 kg/m as calculated from the following:   Height as of this encounter: 5\' 4"  (1.626 m).   Weight as of this encounter: 120.2 kg.   ABG:    Component Value Date/Time   PHART 7.498 (H) 01/18/2019 1018   PCO2ART 33.5 01/18/2019 1018   PO2ART 46.0 (L) 01/18/2019 1018   HCO3 26.0 01/18/2019 1018   TCO2 27 01/18/2019 1018   O2SAT 86.0 01/18/2019 1018      Condition -stable  Family Communication  : Spoke with patient's brother over the phone on 8/14  Code Status :  Full Code  Diet :  Diet Order            Diet Carb Modified Fluid consistency: Thin; Room service appropriate? Yes  Diet effective now               Disposition Plan  :  Remain hospitalized-Home hopefully tomorrow if home health services approved by [employer] prison services.  Consults  :  None   Procedures  :  None  GI prophylaxis: PPI  DVT Prophylaxis  : Eliquis  Lab Results  Component Value Date   PLT 131 (L) 01/26/2019    Antibiotics  :    Anti-infectives (From admission, onward)   Start     Dose/Rate Route Frequency Ordered Stop   01/17/19 1700  remdesivir 100 mg in sodium chloride 0.9 % 250 mL IVPB     100 mg 500 mL/hr over 30 Minutes Intravenous Every 24 hours 01/16/19 1617 01/20/19 1745   01/16/19 1700  remdesivir 200 mg in sodium chloride 0.9 % 250 mL IVPB     200 mg 500 mL/hr over 30 Minutes Intravenous Once 01/16/19 1617 01/16/19 1824      Inpatient Medications  Scheduled Meds: . amiodarone  200 mg Oral BID  . apixaban  5 mg Oral BID  . baclofen  10 mg Oral QHS  . furosemide  40 mg Intravenous BID  . insulin aspart  0-20 Units Subcutaneous TID WC  . insulin aspart  0-5 Units Subcutaneous QHS  . insulin aspart  8 Units Subcutaneous TID WC  . insulin glargine  34 Units Subcutaneous Daily  . insulin glargine  6 Units Subcutaneous Once  . pantoprazole  40 mg Oral Daily  . predniSONE  40 mg Oral Q breakfast  . sodium chloride flush  10-40 mL Intracatheter Q12H  . sodium chloride flush  3 mL Intravenous Q12H  . umeclidinium bromide  1 puff Inhalation Daily   Continuous Infusions:  PRN Meds:.acetaminophen, ALPRAZolam, alum & mag hydroxide-simeth, chlorpheniramine-HYDROcodone, diphenhydrAMINE, guaiFENesin-dextromethorphan, levalbuterol, lip balm, ondansetron **OR** ondansetron (ZOFRAN) IV, oxyCODONE, polyethylene glycol, sodium chloride, sodium chloride flush, zolpidem   Time Spent in minutes  25  See all Orders from today for further details   Jeoffrey Massed M.D on 01/26/2019 at 4:30 PM  To page go to [email or web address] - use universal password  [facility] -  Office  [phone]    Objective:  Vitals:   01/26/19 0430 01/26/19 0937 01/26/19 1000 01/26/19 1544  BP: 100/61 (!) 109/55  118/65  Pulse: 68 84 79 84  Resp: (!) 21 (!) 27 (!)  25 20  Temp: 98.6 F (37 C) 98.1 F (36.7 C)  98.1 F (36.7 C)  TempSrc: Oral Oral  Oral  SpO2: 96% 93% 96% (!) 86%  Weight:      Height:        Wt Readings from Last 3 Encounters:  01/23/19 120.2 kg  07/27/18 119.3 kg     Intake/Output Summary (Last 24 hours) at 01/26/2019 1630 Last data filed at 01/26/2019 3474 Gross per 24 hour  Intake 1210 ml  Output 401 ml  Net 809 ml    Physical Exam Gen Exam:Alert awake-not in any distress HEENT:atraumatic, normocephalic Chest: B/L clear to auscultation anteriorly CVS:S1S2 regular Abdomen:soft non tender, non distended Extremities:no edema Neurology: Non focal Skin: no rash   Data Review:    CBC Recent Labs  Lab 01/20/19 0142  01/22/19 0148 01/23/19 0150 01/24/19 0220 01/25/19 0220 01/26/19 0120  WBC 7.3   < > 9.6 9.3 12.4* 11.5* 12.2*  HGB 13.6   < > 13.5 14.0 14.9 14.1 14.0  HCT 40.9   < > 40.7 42.1 43.6 41.3 42.1  PLT 348   < > 251 177 227 144* 131*  MCV 85.6   < > 85.9 85.7 85.5 85.3 86.4  MCH 28.5   < > 28.5 28.5 29.2 29.1 28.7  MCHC 33.3   < > 33.2 33.3 34.2 34.1 33.3  RDW 16.9*   < > 17.0* 17.2* 17.3* 17.4* 17.6*  LYMPHSABS 0.4*  --   --   --   --   --   --   MONOABS 0.4  --   --   --   --   --   --   EOSABS 0.0  --   --   --   --   --   --   BASOSABS 0.0  --   --   --   --   --   --    < > = values in this interval not displayed.    Chemistries  Recent Labs  Lab 01/22/19 0148 01/23/19 0150 01/24/19 0220 01/25/19 0220 01/26/19 0120  NA 134* 131* 134* 136 136  K 4.6 4.5 4.2 4.4 4.0  CL 96* 94* 92* 97* 94*  CO2 28 25 29 30 29   GLUCOSE 307* 274* 92 137* 104*  BUN 32* 32* 35* 39* 39*  CREATININE 0.99 0.99 1.02* 1.04* 0.95  CALCIUM 8.0* 7.7* 7.9* 7.9* 7.7*  AST 26 25 37 27 27  ALT 25 27 36 34 34  ALKPHOS 66 66 68 67 62  BILITOT 0.5 0.5 0.8 0.6 0.7   ------------------------------------------------------------------------------------------------------------------ No results for input(s): CHOL,  HDL, LDLCALC, TRIG, CHOLHDL, LDLDIRECT in the last 72 hours.  Lab Results  Component Value Date   HGBA1C 7.1 (H) 01/15/2019   ------------------------------------------------------------------------------------------------------------------ No results for input(s): TSH, T4TOTAL, T3FREE, THYROIDAB in the last 72 hours.  Invalid input(s): FREET3 ------------------------------------------------------------------------------------------------------------------ Recent Labs    01/25/19 0220 01/26/19 0120  FERRITIN 127 151    Coagulation profile No results for input(s): INR, PROTIME in the last 168 hours.  Recent Labs    01/25/19 0220 01/26/19 0120  DDIMER 4.44* 3.20*    Cardiac Enzymes No results for input(s): CKMB, TROPONINI, MYOGLOBIN in the last 168 hours.  Invalid input(s): CK ------------------------------------------------------------------------------------------------------------------    Component  Value Date/Time   BNP 127.1 (H) 01/19/2019 0215    Micro Results No results found for this or any previous visit (from the past 240 hour(s)).  Radiology Reports Ct Angio Chest Pe W And/or Wo Contrast  Result Date: 01/15/2019 CLINICAL DATA:  EMS call for CP and SHOB x 1 week . Pt reported Pain increased with movement. Pt has Dx of A-fib and a ablation the patient . Pt given 324 ASA and one NGT SL . BP 120/70 prior to meds and BP 74/40 after meds. On arrival EMS reported BP 120/70. Pt reports relief of CP From meds. HR 80, CBG 137, 97.2 temp. EXAM: CT ANGIOGRAPHY CHEST CT ABDOMEN AND PELVIS WITH CONTRAST TECHNIQUE: Multidetector CT imaging of the chest was performed using the standard protocol during bolus administration of intravenous contrast. Multiplanar CT image reconstructions and MIPs were obtained to evaluate the vascular anatomy. Multidetector CT imaging of the abdomen and pelvis was performed using the standard protocol during bolus administration of intravenous  contrast. CONTRAST:  100mL OMNIPAQUE IOHEXOL 350 MG/ML SOLN COMPARISON:  None. FINDINGS: CTA CHEST FINDINGS Cardiovascular: There is satisfactory opacification of the pulmonary arteries to the segmental level. There is no evidence of a pulmonary embolism. Heart is normal in size. No pericardial effusion. Minimal three-vessel coronary artery calcifications. Great vessels are normal in caliber. Mild aortic atherosclerosis. No dissection. Mediastinum/Nodes: Large hiatal hernia, lying to the right of the lower thoracic spine. Esophagus unremarkable. Trachea widely patent. Normal thyroid. No neck base, axillary, mediastinal or hilar masses or enlarged lymph nodes. Lungs/Pleura: Bilateral, peripheral ground-glass airspace opacities. Slight upper lobe predominance. No lung masses or suspicious nodules. No interstitial thickening. No pleural effusion or pneumothorax. Musculoskeletal: No fracture or acute finding. No osteoblastic or osteolytic lesions. Review of the MIP images confirms the above findings. CT ABDOMEN and PELVIS FINDINGS Hepatobiliary: Decreased attenuation of the liver consistent with fatty infiltration. No liver mass or focal lesion. Liver normal in size. Normal gallbladder. No bile duct dilation. Pancreas: Unremarkable. No pancreatic ductal dilatation or surrounding inflammatory changes. Spleen: Normal in size without focal abnormality. Adrenals/Urinary Tract: Adrenal glands are unremarkable. Kidneys are normal, without renal calculi, focal lesion, or hydronephrosis. Bladder is unremarkable. Stomach/Bowel: Stomach unremarkable other than the large hiatal hernia. Small bowel and colon are normal in caliber. No wall thickening. No inflammation. There are colonic diverticula mostly along the left colon. No diverticulitis. Normal appendix visualized. Vascular/Lymphatic: Mild aortic atherosclerosis.  No aneurysm. No enlarged lymph nodes. Reproductive: Small partly calcified uterine fibroids. Uterus normal in  overall size. No ovarian/adnexal masses. Other: No abdominal wall hernia or abnormality. No abdominopelvic ascites. Musculoskeletal: No fracture or acute finding. No osteoblastic or osteolytic lesions. Review of the MIP images confirms the above findings. IMPRESSION: CTA CHEST 1. No evidence of a pulmonary embolism. 2. Bilateral peripheral ground-glass airspace lung opacities with a slight upper lobe predominance. Findings consistent with multifocal infection. Consider atypical infection including viral infection. Noninfectious lung inflammation may also have this appearance. 3. No other acute findings in the chest. 4. Large hiatal hernia. CT ABDOMEN AND PELVIS 1. No acute findings within the abdomen or pelvis. 2. Hepatic steatosis. 3. Aortic atherosclerosis. 4. Scattered colonic diverticula without diverticulitis. Electronically Signed   By: Amie Portlandavid  Ormond M.D.   On: 01/15/2019 12:01   Ct Abdomen Pelvis W Contrast  Result Date: 01/15/2019 CLINICAL DATA:  EMS call for CP and SHOB x 1 week . Pt reported Pain increased with movement. Pt has Dx of A-fib and a ablation  the patient . Pt given 324 ASA and one NGT SL . BP 120/70 prior to meds and BP 74/40 after meds. On arrival EMS reported BP 120/70. Pt reports relief of CP From meds. HR 80, CBG 137, 97.2 temp. EXAM: CT ANGIOGRAPHY CHEST CT ABDOMEN AND PELVIS WITH CONTRAST TECHNIQUE: Multidetector CT imaging of the chest was performed using the standard protocol during bolus administration of intravenous contrast. Multiplanar CT image reconstructions and MIPs were obtained to evaluate the vascular anatomy. Multidetector CT imaging of the abdomen and pelvis was performed using the standard protocol during bolus administration of intravenous contrast. CONTRAST:  OMNIPAQUE IOHEXOL 350 MG/ML SOLN COMPARISON:  None. FINDINGS: CTA CHEST FINDINGS Cardiovascular: There is satisfactory opacification of the pulmonary arteries to the segmental level. There is no evidence of  a pulmonary embolism. Heart is normal in size. No pericardial effusion. Minimal three-vessel coronary artery calcifications. Great vessels are normal in caliber. Mild aortic atherosclerosis. No dissection. Mediastinum/Nodes: Large hiatal hernia, lying to the right of the lower thoracic spine. Esophagus unremarkable. Trachea widely patent. Normal thyroid. No neck base, axillary, mediastinal or hilar masses or enlarged lymph nodes. Lungs/Pleura: Bilateral, peripheral ground-glass airspace opacities. Slight upper lobe predominance. No lung masses or suspicious nodules. No interstitial thickening. No pleural effusion or pneumothorax. Musculoskeletal: No fracture or acute finding. No osteoblastic or osteolytic lesions. Review of the MIP images confirms the above findings. CT ABDOMEN and PELVIS FINDINGS Hepatobiliary: Decreased attenuation of the liver consistent with fatty infiltration. No liver mass or focal lesion. Liver normal in size. Normal gallbladder. No bile duct dilation. Pancreas: Unremarkable. No pancreatic ductal dilatation or surrounding inflammatory changes. Spleen: Normal in size without focal abnormality. Adrenals/Urinary Tract: Adrenal glands are unremarkable. Kidneys are normal, without renal calculi, focal lesion, or hydronephrosis. Bladder is unremarkable. Stomach/Bowel: Stomach unremarkable other than the large hiatal hernia. Small bowel and colon are normal in caliber. No wall thickening. No inflammation. There are colonic diverticula mostly along the left colon. No diverticulitis. Normal appendix visualized. Vascular/Lymphatic: Mild aortic atherosclerosis.  No aneurysm. No enlarged lymph nodes. Reproductive: Small partly calcified uterine fibroids. Uterus normal in overall size. No ovarian/adnexal masses. Other: No abdominal wall hernia or abnormality. No abdominopelvic ascites. Musculoskeletal: No fracture or acute finding. No osteoblastic or osteolytic lesions. Review of the MIP images confirms  the above findings. IMPRESSION: CTA CHEST 1. No evidence of a pulmonary embolism. 2. Bilateral peripheral ground-glass airspace lung opacities with a slight upper lobe predominance. Findings consistent with multifocal infection. Consider atypical infection including viral infection. Noninfectious lung inflammation may also have this appearance. 3. No other acute findings in the chest. 4. Large hiatal hernia. CT ABDOMEN AND PELVIS 1. No acute findings within the abdomen or pelvis. 2. Hepatic steatosis. 3. Aortic atherosclerosis. 4. Scattered colonic diverticula without diverticulitis. Electronically Signed   By: Amie Portland M.D.   On: 01/15/2019 12:01   Dg Chest Port 1 View  Result Date: 01/15/2019 CLINICAL DATA:  Fall 7/25, chest pain/SOB since then. Hx of stroke, lupus, hypertension, diabetes, coronary artery disease, asthma, afib. Nonsmoker. EXAM: PORTABLE CHEST 1 VIEW COMPARISON:  None. FINDINGS: Cardiac silhouette is mildly enlarged. No mediastinal or hilar masses. Subtle hazy airspace opacities are noted in the peripheral left mid and lower lung, possibly in the peripheral right mid lung. There are mildly prominent bronchovascular markings diffusely. No convincing pleural effusion.  No pneumothorax. Skeletal structures are grossly intact. IMPRESSION: 1. Subtle hazy airspace opacities in the left mid and lower lung and possibly right peripheral  mid lung. Consider multifocal pneumonia if there are consistent clinical findings. Electronically Signed   By: Amie Portlandavid  Ormond M.D.   On: 01/15/2019 10:26   Dg Chest Port 1v Same Day  Result Date: 01/18/2019 CLINICAL DATA:  Shortness of breath EXAM: PORTABLE CHEST 1 VIEW COMPARISON:  January 15, 2019 FINDINGS: The study is extremely limited due to positioning. Patchy airspace opacities seen throughout both lungs, left greater than right. The cardiomediastinal silhouette is obscured. IMPRESSION: Extremely limited exam due to patient positioning. Airspace opacities  throughout both lungs. Electronically Signed   By: Jonna ClarkBindu  Avutu M.D.   On: 01/18/2019 11:10

## 2019-01-26 NOTE — Plan of Care (Signed)
Patient on 2L oxygen and is alert and responsive with no distress noted.  Tolerated her medications with no adverse reactions manifested.  Denies pain and complains.  Bed in lowest position, callbell within reach and continue to monitor for safety.  Problem: Education: Goal: Knowledge of risk factors and measures for prevention of condition will improve Outcome: Progressing   Problem: Coping: Goal: Psychosocial and spiritual needs will be supported Outcome: Progressing   Problem: Respiratory: Goal: Will maintain a patent airway Outcome: Progressing   Problem: Safety: Goal: Ability to remain free from injury will improve Outcome: Progressing

## 2019-01-27 LAB — COMPREHENSIVE METABOLIC PANEL
ALT: 33 U/L (ref 0–44)
AST: 28 U/L (ref 15–41)
Albumin: 2.4 g/dL — ABNORMAL LOW (ref 3.5–5.0)
Alkaline Phosphatase: 58 U/L (ref 38–126)
Anion gap: 10 (ref 5–15)
BUN: 31 mg/dL — ABNORMAL HIGH (ref 8–23)
CO2: 32 mmol/L (ref 22–32)
Calcium: 7.9 mg/dL — ABNORMAL LOW (ref 8.9–10.3)
Chloride: 94 mmol/L — ABNORMAL LOW (ref 98–111)
Creatinine, Ser: 1.07 mg/dL — ABNORMAL HIGH (ref 0.44–1.00)
GFR calc Af Amer: >60 mL/min (ref >60)
GFR calc non Af Amer: 55 mL/min — ABNORMAL LOW (ref >60)
Glucose, Bld: 65 mg/dL — ABNORMAL LOW (ref 70–99)
Potassium: 4.4 mmol/L (ref 3.5–5.1)
Sodium: 136 mmol/L (ref 135–145)
Total Bilirubin: 1.5 mg/dL — ABNORMAL HIGH (ref 0.3–1.2)
Total Protein: 6 g/dL — ABNORMAL LOW (ref 6.5–8.1)

## 2019-01-27 LAB — CBC
HCT: 43.3 % (ref 36.0–46.0)
Hemoglobin: 14.2 g/dL (ref 12.0–15.0)
MCH: 28.4 pg (ref 26.0–34.0)
MCHC: 32.8 g/dL (ref 30.0–36.0)
MCV: 86.6 fL (ref 80.0–100.0)
Platelets: 121 10*3/uL — ABNORMAL LOW (ref 150–400)
RBC: 5 MIL/uL (ref 3.87–5.11)
RDW: 17.8 % — ABNORMAL HIGH (ref 11.5–15.5)
WBC: 10 10*3/uL (ref 4.0–10.5)
nRBC: 0 % (ref 0.0–0.2)

## 2019-01-27 LAB — FERRITIN: Ferritin: 136 ng/mL (ref 11–307)

## 2019-01-27 LAB — GLUCOSE, CAPILLARY
Glucose-Capillary: 324 mg/dL — ABNORMAL HIGH (ref 70–99)
Glucose-Capillary: 193 mg/dL — ABNORMAL HIGH (ref 70–99)
Glucose-Capillary: 165 mg/dL — ABNORMAL HIGH (ref 70–99)

## 2019-01-27 LAB — D-DIMER, QUANTITATIVE: D-Dimer, Quant: 2.59 ug/mL-FEU — ABNORMAL HIGH (ref 0.00–0.50)

## 2019-01-27 LAB — C-REACTIVE PROTEIN: CRP: <0.8 mg/dL (ref <1.0)

## 2019-01-27 MED ORDER — AMIODARONE HCL 200 MG PO TABS: 200.0000 mg | ORAL_TABLET | Freq: Every day | ORAL | 0 refills | Status: DC

## 2019-01-27 MED ORDER — PREDNISONE 20 MG PO TABS
30.0000 mg | ORAL_TABLET | Freq: Every day | ORAL | Status: DC
  Administered 2019-01-27: 30 mg via ORAL

## 2019-01-27 MED ORDER — FUROSEMIDE 10 MG/ML IJ SOLN
40.0000 mg | Freq: Every day | INTRAMUSCULAR | Status: DC
  Administered 2019-01-27: 40 mg via INTRAVENOUS

## 2019-01-27 MED ORDER — PREDNISONE 10 MG PO TABS: ORAL_TABLET | ORAL | 0 refills | Status: DC

## 2019-01-27 MED FILL — predniSONE 10 MG TABS: 10 | 3 days supply | Qty: 6 | Fill #0

## 2019-01-27 MED FILL — AMIODARONE HCL 200 MG TABS: 200 | 30 days supply | Qty: 30 | Fill #0

## 2019-01-27 NOTE — Progress Notes (Signed)
Physical Therapy Treatment Patient Details Name: Stacey Burns MRN: [id number] DOB: Nov 23, 1955 Today's Date: 01/27/2019    History of Present Illness Patient is a 63 y.o. female with PMHx of lupus , DM, HTN, A. fib who presented 8/2  with 1 week history of abdominal cramps, chest discomfort and progressive shortness of breath-she was found to have acute hypoxic respiratory failure in the setting of COVID-19 pneumonia.  Hospital course complicated by worsening hypoxemia requiring 15 L high flow oxygen.    PT Comments    The patient is progressing well. Ambulated on 2 L O2 with SaO2 91%, amb. X 100' x 2, sat to res on rollator. Patient excited about returning home/   Follow Up Recommendations  Home health PT;Supervision for mobility/OOB     Equipment Recommendations  Other (comment)    Recommendations for Other Services       Precautions / Restrictions Precautions Precaution Comments: monitor sats    Mobility  Bed Mobility               General bed mobility comments: OOB in recliner  Transfers   Equipment used: 4-wheeled walker Transfers: Sit to/from Stand Sit to Stand: Modified independent (Device/Increase time)         General transfer comment: pt maneuvred around O2 tubing  Ambulation/Gait Ambulation/Gait assistance: Supervision Gait Distance (Feet): 100 Feet(x 2) Assistive device: 4-wheeled walker Gait Pattern/deviations: Step-through pattern     General Gait Details: pt ambulated, turned and rested on Rollator, cues to loclk and unlock brakes. Sao2 on 2 L 88%, monitor on ear.   Stairs             Wheelchair Mobility    Modified Rankin (Stroke Patients Only)       Balance     Sitting balance-Leahy Scale: Good     Standing balance support: During functional activity;Single extremity supported Standing balance-Leahy Scale: Fair                              Cognition Arousal/Alertness: Awake/alert Behavior During  Therapy: WFL for tasks assessed/performed Overall Cognitive Status: Within Functional Limits for tasks assessed                                        Exercises      General Comments        Pertinent Vitals/Pain      Home Living                      Prior Function            PT Goals (current goals can now be found in the care plan section) Progress towards PT goals: Progressing toward goals    Frequency    Min 3X/week      PT Plan Current plan remains appropriate    Co-evaluation              AM-PAC PT "6 Clicks" Mobility   Outcome Measure  Help needed turning from your back to your side while in a flat bed without using bedrails?: None Help needed moving from lying on your back to sitting on the side of a flat bed without using bedrails?: None Help needed moving to and from a bed to a chair (including a wheelchair)?: None Help needed standing up from a chair  using your arms (e.g., wheelchair or bedside chair)?: None Help needed to walk in hospital room?: A Little Help needed climbing 3-5 steps with a railing? : A Lot 6 Click Score: 21    End of Session Equipment Utilized During Treatment: Oxygen Activity Tolerance: Patient tolerated treatment well Patient left: in chair;with call bell/phone within reach Nurse Communication: Mobility status PT Visit Diagnosis: Unsteadiness on feet (R26.81);Difficulty in walking, not elsewhere classified (R26.2)     Time: [id number] PT Time Calculation (min) (ACUTE ONLY): 55 min  Charges:  $Gait Training: 23-37 mins $Self Care/Home Management: 23-37                     Blanchard KelchKaren Cortnie Ringel PT Acute Rehabilitation Services  Office [phone]    Rada HayHill, Denaly Gatling Elizabeth 01/27/2019, 1:39 PM

## 2019-01-27 NOTE — Care Management (Signed)
Case manager spoke with Awilda Bill, CM with [employer] [phone] at 1530 and informed her that patient will discharge and that the authorization for [place] services has to be emailed to [employer] with [place] ; [email or web address]. Ms. Gloriann Loan spoke with Ms. Laurance Flatten who is the assigned Case Manager and they will see that authorization is sent.     Ricki Miller, RN Case Manager  [phone]

## 2019-01-27 NOTE — Care Management (Signed)
Case manager called [employer] [phone] ext [phone],and left message for Case Manager, Awilda Bill requesting return call regarding authorization for [facility] and release patient to discharge home. CM will continue to follow.    Ricki Miller, RN BSN Case Manager [phone]

## 2019-01-27 NOTE — Plan of Care (Signed)
Patient on 1L oxygen via HFNC, alert and responsive with no distress noted.  No complain verbalized at this time.  Bed in lowest position, callbell within reach and continue to monitor for safety.  Problem: Education: Goal: Knowledge of risk factors and measures for prevention of condition will improve Outcome: Progressing   Problem: Respiratory: Goal: Will maintain a patent airway Outcome: Progressing   Problem: Clinical Measurements: Goal: Ability to maintain clinical measurements within normal limits will improve Outcome: Progressing Goal: Cardiovascular complication will be avoided Outcome: Progressing   Problem: Activity: Goal: Risk for activity intolerance will decrease Outcome: Progressing

## 2019-01-27 NOTE — Progress Notes (Signed)
  Phyisical therapy note. PT stated in narrative on 8/12 for 4 wheeled RW but neglected to change in recommendation.Patient will benefit from 4 wheeled w/ seat in order to mobilize modified independent and rest when SOB, can use for self ADL's. [place]  Office [phone]

## 2019-01-27 NOTE — Care Management (Addendum)
Case manager has spoken to patient concerning discharge plan. She and PT informed CM that she needed a rollator rather than a rolling walker. Case manager contacted Jeneen Rinks with [employer] and requested that walker be exchanged. Can not confirm what time this will happen. Dr. Sloan Leiter spoke with [facility] Pharmacist and patient will be provided with her medications, CM dept will cover $6.02 from petty cash. Oxygen is at the home. Patient's daughter Bonnita Nasuti will transport her home after meds have been received. Patient will discharge after courier delivers meds to [facility] at 3:00pm. CM will contact [facility] to have tank for transport delivered to patients room. Case Manager continues to reach Case Manager with [employer] and asked patient to have her call when she speaks to her as well.    Ricki Miller, RN BSN Case Manager [phone]

## 2019-01-27 NOTE — Progress Notes (Signed)
Discharge instructions given to patient with teach back.  Medications that were provided by pharmacy also given to patient to take home.  All questions welcomed and answered.

## 2019-01-27 NOTE — Discharge Summary (Signed)
PATIENT DETAILS Name: Stacey CordShirlene Reese Burns Age: 63 y.o. Sex: female Date of Birth: Sep 17, 1955 MRN: [id number]. Admitting Physician: Lahoma Crockerheresa C Sheehan, MD WUJ:[id number]:Church, Vita ErmShea N, PA-C  Admit Date: 01/15/2019 Discharge date: 01/27/2019  Recommendations for Outpatient Follow-up:  1. Follow up with PCP in 1-2 weeks 2. Please obtain CMP/CBC in one week 3. Please ensure patient follows up with cardiology, rheumatology-in the next 1-2 weeks 4. Please assess for O2 requirement at next visit  Admitted From:  Home  Disposition: Home   Home Health: Yes  Equipment/Devices: Oxygen 2L  Discharge Condition: Stable  CODE STATUS: FULL CODE  Diet recommendation:  Diet Order            Diet - low sodium heart healthy        Diet Carb Modified        Diet Carb Modified Fluid consistency: Thin; Room service appropriate? Yes  Diet effective now               Brief Summary: See H&P, Labs, Consult and Test reports for all details in brief, Patient is a 63 y.o. female with PMHx of lupus on Imuran, DM, HTN, A. fib on Eliquis who presented with 1 week history of abdominal cramps, chest discomfort and progressive shortness of breath-she was found to have acute hypoxic respiratory failure in the setting of COVID-19 pneumonia.  Hospital course complicated by worsening hypoxemia requiring 15 L high flow oxygen.  Brief Hospital Course: Acute Hypoxic Resp Failure due to Covid 19 Viral pneumonia:  Significantly improved-at one point during the initial part of her hospital stay was on 15 L of high flow oxygen.  Treated with a course of Remdesivir, 1 dose of Actemra and steroids at that being tapered down.  She is barely requiring 1 L of oxygen at rest-requires approximately 2-3 L of oxygen with ambulation.  She is medically stable and can be discharged home today with home O2.  Home health arrangements are being made by case management.  Fever: Afebrile  Oxygen requirement: On 1 L at rest,  requires around 2-3 L with ambulation  COVID-19 Labs:  Recent Labs    01/25/19 0220 01/26/19 0120 01/27/19 0305  DDIMER 4.44* 3.20* 2.59*  FERRITIN 127 151 136  CRP <0.8 <0.8 <0.8    Lab Results  Component Value Date   SARSCOV2NAA POSITIVE (A) 01/15/2019    COVID-19 Medications: Steroids: Decadron 8/3-8/5, Solu-Medrol 8/5>> 8/12, prednisone 8/12>>continued for 3 more days on discharge Remdesivir: 8/3>>8/7 Actemra: 8/6x1 Convalescent Plasma: N/A Research Studies: N/A  Other medications: Diuretics: IV Lasix-change to home dosing on d/c-euvolemic on exam Antibiotics: None  A. fib/atrial fibrillation with RVR with spells of bradycardia: Maintaining sinus rhythm-continues to have episodes of bradycardia  and nonconducted PACs overnight-probably related to underlying OSA.  Briefly required IV amiodarone infusion to control RVR, subsequently converted to oral amiodarone.  Will change amiodarone dosing to daily dosing-have asked patient to [facility] in the next 1-2 weeks.  Continue Eliquis.  Note-Case was discussed with multiple cardiologist over the phone who did chart review with this MD during this hospital stay.   Chronic diastolic heart failure: Euvolemic-I/O's inaccurate-weight has markedly decreased to 254 pounds (267 pounds on admission) she was given Lasix to keep in negative balance-she does have some mild chronic lower extremity edema that is unchanged.  Plans are to resume usual home regimen of Lasix on discharge.    HTN: Controlled-resume lisinopril on discharge  Insulin-dependent DM-2: CBG  have improved over the past few days as steroid dosage has been decreased-she will be tapered off prednisone in the next 3 days-plans are to resume her usual regimen of insulin on discharge.  Follow-up with PCP for further optimization.    Lupus: Appears to be stable-apparently was diagnosed with pancolitis in 2015-Imuran was held-she  was given Actemra.  Plans are to resume Imuran on discharge.    COPD/asthma: No wheezing-appears stable-continue as needed bronchodilators                                                  OSA: Not on CPAP due to COVID-on high flow oxygen  Coagulase-negative Staphylococcus bacteremia times 1 out of 2 cultures: Likely contaminant-no indication of infection  Obesity: Estimated body mass index is 45.49 kg/m    Note-unable to reach patient's brother on the phone-left a voicemail on the day of discharge  Procedures/Studies: None  Discharge Diagnoses:  Principal Problem:   Pneumonia due to COVID-19 virus Active Problems:   Type 2 diabetes mellitus (HCC)   Essential hypertension   Lupus (HCC)   Clotting disorder (HCC)   Hyperlipidemia   CPAP (continuous positive airway pressure) dependence   Paroxysmal atrial fibrillation (HCC)   Acute respiratory failure with hypoxia [facility](HCC)   Discharge Instructions:  Activity:  As tolerated   Discharge Instructions    Call MD for:  difficulty breathing, headache or visual disturbances   Complete by: As directed    Call MD for:  extreme fatigue   Complete by: As directed    Call MD for:  persistant dizziness or light-headedness   Complete by: As directed    Call MD for:  persistant nausea and vomiting   Complete by: As directed    Diet - low sodium heart healthy   Complete by: As directed    Diet Carb Modified   Complete by: As directed    Discharge instructions   Complete by: As directed    Follow with Primary MD  Lenord Fellershurch, Shea N, PA-C in 1-2 weeks  Follow with your primary cardiologist in 1 week  Please get a complete blood count and chemistry panel checked by your Primary MD at your next visit, and again as instructed by your Primary MD.  Get Medicines reviewed and adjusted: Please take all your medications with you for your next visit with your Primary MD  Laboratory/radiological data: Please request your Primary MD to go  over all hospital tests and procedure/radiological results at the follow up, please ask your Primary MD to get all Hospital records sent to his/her office.  In some cases, they will be blood work, cultures and biopsy results pending at the time of your discharge. Please request that your primary care M.D. follows up on these results.  Also Note the following: If you experience worsening of your admission symptoms, develop shortness of breath, life threatening emergency, suicidal or homicidal thoughts you must seek medical attention immediately by calling 911 or calling your MD immediately  if symptoms less severe.  You must read complete instructions/literature along with all the possible adverse reactions/side effects for all the Medicines you take and that have been prescribed to you. Take any new Medicines after you have completely understood and accpet all the possible adverse reactions/side effects.   Do not drive when taking Pain medications or sleeping medications (  Benzodaizepines)  Do not take more than prescribed Pain, Sleep and Anxiety Medications. It is not advisable to combine anxiety,sleep and pain medications without talking with your primary care practitioner  Special Instructions: If you have smoked or chewed Tobacco  in the last 2 yrs please stop smoking, stop any regular Alcohol  and or any Recreational drug use.  Wear Seat belts while driving.  Please note: You were cared for by a hospitalist during your hospital stay. Once you are discharged, your primary care physician will handle any further medical issues. Please note that NO REFILLS for any discharge medications will be authorized once you are discharged, as it is imperative that you return to your primary care physician (or establish a relationship with a primary care physician if you do not have one) for your post hospital discharge needs so that they can reassess your need for medications and monitor your lab  values.  ?   Person Under Monitoring Name: Paradise Vensel  Location: [address] [place] [address]   Infection Prevention Recommendations for Individuals Confirmed to have, or Being Evaluated for, 2019 Novel Coronavirus (COVID-19) Infection Who Receive Care at Home  Individuals who are confirmed to have, or are being evaluated for, COVID-19 should follow the prevention steps below until a healthcare provider or local or state health department says they can return to normal activities.  Stay home except to get medical care You should restrict activities outside your home, except for getting medical care. Do not go to work, school, or public areas, and do not use public transportation or taxis.  Call ahead before visiting your doctor Before your medical appointment, call the healthcare provider and tell them that you have, or are being evaluated for, COVID-19 infection. This will help the healthcare providers office take steps to keep other people from getting infected. Ask your healthcare provider to call the local or state health department.  Monitor your symptoms Seek prompt medical attention if your illness is worsening (e.g., difficulty breathing). Before going to your medical appointment, call the healthcare provider and tell them that you have, or are being evaluated for, COVID-19 infection. Ask your healthcare provider to call the local or state health department.  Wear a facemask You should wear a facemask that covers your nose and mouth when you are in the same room with other people and when you visit a healthcare provider. People who live with or visit you should also wear a facemask while they are in the same room with you.  Separate yourself from other people in your home As much as possible, you should stay in a different room from other people in your home. Also, you should use a separate bathroom, if available.  Avoid sharing household items You  should not share dishes, drinking glasses, cups, eating utensils, towels, bedding, or other items with other people in your home. After using these items, you should wash them thoroughly with soap and water.  Cover your coughs and sneezes Cover your mouth and nose with a tissue when you cough or sneeze, or you can cough or sneeze into your sleeve. Throw used tissues in a lined trash can, and immediately wash your hands with soap and water for at least 20 seconds or use an alcohol-based hand rub.  Wash your [employer] your hands often and thoroughly with soap and water for at least 20 seconds. You can use an alcohol-based hand sanitizer if soap and water are not available and if  your hands are not visibly dirty. Avoid touching your eyes, nose, and mouth with unwashed hands.   Prevention Steps for Caregivers and Household Members of Individuals Confirmed to have, or Being Evaluated for, COVID-19 Infection Being Cared for in the Home  If you live with, or provide care at home for, a person confirmed to have, or being evaluated for, COVID-19 infection please follow these guidelines to prevent infection:  Follow healthcare providers instructions Make sure that you understand and can help the patient follow any healthcare provider instructions for all care.  Provide for the patients basic needs You should help the patient with basic needs in the home and provide support for getting groceries, prescriptions, and other personal needs.  Monitor the patients symptoms If they are getting sicker, call his or her medical provider and tell them that the patient has, or is being evaluated for, COVID-19 infection. This will help the healthcare providers office take steps to keep other people from getting infected. Ask the healthcare provider to call the local or state health department.  Limit the number of people who have contact with the patient If possible, have only one caregiver for the  patient. Other household members should stay in another home or place of residence. If this is not possible, they should stay in another room, or be separated from the patient as much as possible. Use a separate bathroom, if available. Restrict visitors who do not have an essential need to be in the home.  Keep older adults, very young children, and other sick people away from the patient Keep older adults, very young children, and those who have compromised immune systems or chronic health conditions away from the patient. This includes people with chronic heart, lung, or kidney conditions, diabetes, and cancer.  Ensure good ventilation Make sure that shared spaces in the home have good air flow, such as from an air conditioner or an opened window, weather permitting.  Wash your hands often Wash your hands often and thoroughly with soap and water for at least 20 seconds. You can use an alcohol based hand sanitizer if soap and water are not available and if your hands are not visibly dirty. Avoid touching your eyes, nose, and mouth with unwashed hands. Use disposable paper towels to dry your hands. If not available, use dedicated cloth towels and replace them when they become wet.  Wear a facemask and gloves Wear a disposable facemask at all times in the room and gloves when you touch or have contact with the patients blood, body fluids, and/or secretions or excretions, such as sweat, saliva, sputum, nasal mucus, vomit, urine, or feces.  Ensure the mask fits over your nose and mouth tightly, and do not touch it during use. Throw out disposable facemasks and gloves after using them. Do not reuse. Wash your hands immediately after removing your facemask and gloves. If your personal clothing becomes contaminated, carefully remove clothing and launder. Wash your hands after handling contaminated clothing. Place all used disposable facemasks, gloves, and other waste in a lined container before  disposing them with other household waste. Remove gloves and wash your hands immediately after handling these items.  Do not share dishes, glasses, or other household items with the patient Avoid sharing household items. You should not share dishes, drinking glasses, cups, eating utensils, towels, bedding, or other items with a patient who is confirmed to have, or being evaluated for, COVID-19 infection. After the person uses these items, you should wash them thoroughly  with soap and water.  Wash laundry thoroughly Immediately remove and wash clothes or bedding that have blood, body fluids, and/or secretions or excretions, such as sweat, saliva, sputum, nasal mucus, vomit, urine, or feces, on them. Wear gloves when handling laundry from the patient. Read and follow directions on labels of laundry or clothing items and detergent. In general, wash and dry with the warmest temperatures recommended on the label.  Clean all areas the individual has used often Clean all touchable surfaces, such as counters, tabletops, doorknobs, bathroom fixtures, toilets, phones, keyboards, tablets, and bedside tables, every day. Also, clean any surfaces that may have blood, body fluids, and/or secretions or excretions on them. Wear gloves when cleaning surfaces the patient has come in contact with. Use a diluted bleach solution (e.g., dilute bleach with 1 part bleach and 10 parts water) or a household disinfectant with a label that says EPA-registered for coronaviruses. To make a bleach solution at home, add 1 tablespoon of bleach to 1 quart (4 cups) of water. For a larger supply, add  cup of bleach to 1 gallon (16 cups) of water. Read labels of cleaning products and follow recommendations provided on product labels. Labels contain instructions for safe and effective use of the cleaning product including precautions you should take when applying the product, such as wearing gloves or eye protection and making sure you  have good ventilation during use of the product. Remove gloves and wash hands immediately after cleaning.  Monitor yourself for signs and symptoms of illness Caregivers and household members are considered close contacts, should monitor their health, and will be asked to limit movement outside of the home to the extent possible. Follow the monitoring steps for close contacts listed on the symptom monitoring form.   ? If you have additional questions, contact your local health department or call the epidemiologist on call at [phone] (available 24/7). ? This guidance is subject to change. For the most up-to-date guidance from [facility], please refer to their website: [employer]Met[employer].hu   Increase activity slowly   Complete by: As directed    MyChart COVID-19 home monitoring program   Complete by: Jan 27, 2019    Is the patient willing to use the MyChart Mobile App for home monitoring?: Yes   Temperature monitoring   Complete by: Jan 27, 2019    After how many days would you like to receive a notification of this patient's flowsheet entries?: 1     Allergies as of 01/27/2019      Reactions   Penicillins Hives, Swelling   Did it involve swelling of the face/tongue/throat, SOB, or low BP? Yes Did it involve sudden or severe rash/hives, skin peeling, or any reaction on the inside of your mouth or nose? No Did you need to seek medical attention at a hospital or doctor's office? No When did it last happen?<less than 10 years If all above answers are NO, may proceed with cephalosporin use.      Medication List    STOP taking these medications   metoprolol tartrate 25 MG tablet Commonly known as: LOPRESSOR     TAKE these medications   acetaminophen 325 MG tablet Commonly known as: TYLENOL Take 325 mg by mouth every 6 (six) hours as needed for pain.   albuterol 108 (90 Base) MCG/ACT inhaler Commonly known as: VENTOLIN  HFA Inhale 2 puffs into the lungs every 6 (six) hours as needed for wheezing or shortness of breath.   amiodarone 200 MG tablet Commonly known  as: PACERONE Take 1 tablet (200 mg total) by mouth daily.   apixaban 5 MG Tabs tablet Commonly known as: Eliquis Take 1 tablet (5 mg total) by mouth 2 (two) times daily.   azaTHIOprine 50 MG tablet Commonly known as: IMURAN Take 175 mg by mouth every morning.   baclofen 10 MG tablet Commonly known as: LIORESAL Take 1 tablet (10 mg total) by mouth at bedtime.   furosemide 40 MG tablet Commonly known as: LASIX Take 1 tablet (40 mg total) by mouth daily. What changed: how much to take   glucose blood test strip Use as instructed   insulin NPH Human 100 UNIT/ML injection Commonly known as: NOVOLIN N Inject into skin, sliding scale.  <150 0 U  150-199: 2U  200-249: 4 U 250-299: 6 U  300-349: 8 U  350-400: 10 U  > 400 call doc   insulin NPH-regular Human (70-30) 100 UNIT/ML injection 28 units with breakfast, 15 units with dinner   Insulin Syringe-Needle U-100 30G X 5/16" 0.3 ML Misc 2 each by Does not apply route 2 (two) times daily.   lisinopril 40 MG tablet Commonly known as: ZESTRIL Take 1 tablet (40 mg total) by mouth daily.   omeprazole 20 MG capsule Commonly known as: PRILOSEC Take 1 capsule (20 mg total) by mouth daily.   Ozempic (0.25 or 0.5 MG/DOSE) 2 MG/1.5ML Sopn Generic drug: Semaglutide(0.25 or 0.5MG /DOS) Inject 0.2 mLs into the skin every Saturday.   predniSONE 10 MG tablet Commonly known as: DELTASONE Take 30 mg daily for 1 day, 20 mg daily for 1 day, 10 mg daily for 1 , then stop   tiotropium 18 MCG inhalation capsule Commonly known as: Spiriva HandiHaler Place 1 capsule (18 mcg total) into inhaler and inhale daily.            Durable Medical Equipment  (From admission, onward)         Start     Ordered   01/25/19 2012  For home use only DME Walker rolling  Once    Question:  Patient needs a  walker to treat with the following condition  Answer:  Generalized weakness   01/25/19 2012   01/25/19 1654  For home use only DME oxygen  Once    Question Answer Comment  Length of Need 6 Months   Mode or (Route) Nasal cannula   Liters per Minute 3   Frequency Continuous (stationary and portable oxygen unit needed)   Oxygen conserving device Yes   Oxygen delivery system Gas      01/25/19 1653   01/24/19 1337  For home use only DME 3 n 1  Once     01/24/19 1336         Follow-up Information    [place], [place], [place]. Schedule an appointment as soon as possible for a visit in 1 week(s).   Specialty: Family Medicine Contact information: [address] [place] [address] [phone]        Marlowe Aschoff, MD. Schedule an appointment as soon as possible for a visit in 1 week(s).   Specialty: Cardiology Contact information: [address] [place] [address] [phone]          Allergies  Allergen Reactions   Penicillins Hives and Swelling    Did it involve swelling of the face/tongue/throat, SOB, or low BP? Yes Did it involve sudden or severe rash/hives, skin peeling, or any reaction on the inside of your mouth  or nose? No Did you need to seek medical attention at a hospital or doctor's office? No When did it last happen?<less than 10 years If all above answers are NO, may proceed with cephalosporin use.     Consultations:   None   Other Procedures/Studies: Ct Angio Chest Pe W And/or Wo Contrast  Result Date: 01/15/2019 CLINICAL DATA:  EMS call for CP and SHOB x 1 week . Pt reported Pain increased with movement. Pt has Dx of A-fib and a ablation the patient . Pt given 324 ASA and one NGT SL . BP 120/70 prior to meds and BP 74/40 after meds. On arrival EMS reported BP 120/70. Pt reports relief of CP From meds. HR 80, CBG 137, 97.2 temp. EXAM: CT ANGIOGRAPHY CHEST CT ABDOMEN AND PELVIS WITH CONTRAST TECHNIQUE: Multidetector CT  imaging of the chest was performed using the standard protocol during bolus administration of intravenous contrast. Multiplanar CT image reconstructions and MIPs were obtained to evaluate the vascular anatomy. Multidetector CT imaging of the abdomen and pelvis was performed using the standard protocol during bolus administration of intravenous contrast. CONTRAST:  111mL OMNIPAQUE IOHEXOL 350 MG/ML SOLN COMPARISON:  None. FINDINGS: CTA CHEST FINDINGS Cardiovascular: There is satisfactory opacification of the pulmonary arteries to the segmental level. There is no evidence of a pulmonary embolism. Heart is normal in size. No pericardial effusion. Minimal three-vessel coronary artery calcifications. Great vessels are normal in caliber. Mild aortic atherosclerosis. No dissection. Mediastinum/Nodes: Large hiatal hernia, lying to the right of the lower thoracic spine. Esophagus unremarkable. Trachea widely patent. Normal thyroid. No neck base, axillary, mediastinal or hilar masses or enlarged lymph nodes. Lungs/Pleura: Bilateral, peripheral ground-glass airspace opacities. Slight upper lobe predominance. No lung masses or suspicious nodules. No interstitial thickening. No pleural effusion or pneumothorax. Musculoskeletal: No fracture or acute finding. No osteoblastic or osteolytic lesions. Review of the MIP images confirms the above findings. CT ABDOMEN and PELVIS FINDINGS Hepatobiliary: Decreased attenuation of the liver consistent with fatty infiltration. No liver mass or focal lesion. Liver normal in size. Normal gallbladder. No bile duct dilation. Pancreas: Unremarkable. No pancreatic ductal dilatation or surrounding inflammatory changes. Spleen: Normal in size without focal abnormality. Adrenals/Urinary Tract: Adrenal glands are unremarkable. Kidneys are normal, without renal calculi, focal lesion, or hydronephrosis. Bladder is unremarkable. Stomach/Bowel: Stomach unremarkable other than the large hiatal hernia. Small  bowel and colon are normal in caliber. No wall thickening. No inflammation. There are colonic diverticula mostly along the left colon. No diverticulitis. Normal appendix visualized. Vascular/Lymphatic: Mild aortic atherosclerosis.  No aneurysm. No enlarged lymph nodes. Reproductive: Small partly calcified uterine fibroids. Uterus normal in overall size. No ovarian/adnexal masses. Other: No abdominal wall hernia or abnormality. No abdominopelvic ascites. Musculoskeletal: No fracture or acute finding. No osteoblastic or osteolytic lesions. Review of the MIP images confirms the above findings. IMPRESSION: CTA CHEST 1. No evidence of a pulmonary embolism. 2. Bilateral peripheral ground-glass airspace lung opacities with a slight upper lobe predominance. Findings consistent with multifocal infection. Consider atypical infection including viral infection. Noninfectious lung inflammation may also have this appearance. 3. No other acute findings in the chest. 4. Large hiatal hernia. CT ABDOMEN AND PELVIS 1. No acute findings within the abdomen or pelvis. 2. Hepatic steatosis. 3. Aortic atherosclerosis. 4. Scattered colonic diverticula without diverticulitis. Electronically Signed   By: Lajean Manes M.D.   On: 01/15/2019 12:01   Ct Abdomen Pelvis W Contrast  Result Date: 01/15/2019 CLINICAL DATA:  EMS call for CP and SHOB x  1 week . Pt reported Pain increased with movement. Pt has Dx of A-fib and a ablation the patient . Pt given 324 ASA and one NGT SL . BP 120/70 prior to meds and BP 74/40 after meds. On arrival EMS reported BP 120/70. Pt reports relief of CP From meds. HR 80, CBG 137, 97.2 temp. EXAM: CT ANGIOGRAPHY CHEST CT ABDOMEN AND PELVIS WITH CONTRAST TECHNIQUE: Multidetector CT imaging of the chest was performed using the standard protocol during bolus administration of intravenous contrast. Multiplanar CT image reconstructions and MIPs were obtained to evaluate the vascular anatomy. Multidetector CT imaging of  the abdomen and pelvis was performed using the standard protocol during bolus administration of intravenous contrast. CONTRAST:  OMNIPAQUE IOHEXOL 350 MG/ML SOLN COMPARISON:  None. FINDINGS: CTA CHEST FINDINGS Cardiovascular: There is satisfactory opacification of the pulmonary arteries to the segmental level. There is no evidence of a pulmonary embolism. Heart is normal in size. No pericardial effusion. Minimal three-vessel coronary artery calcifications. Great vessels are normal in caliber. Mild aortic atherosclerosis. No dissection. Mediastinum/Nodes: Large hiatal hernia, lying to the right of the lower thoracic spine. Esophagus unremarkable. Trachea widely patent. Normal thyroid. No neck base, axillary, mediastinal or hilar masses or enlarged lymph nodes. Lungs/Pleura: Bilateral, peripheral ground-glass airspace opacities. Slight upper lobe predominance. No lung masses or suspicious nodules. No interstitial thickening. No pleural effusion or pneumothorax. Musculoskeletal: No fracture or acute finding. No osteoblastic or osteolytic lesions. Review of the MIP images confirms the above findings. CT ABDOMEN and PELVIS FINDINGS Hepatobiliary: Decreased attenuation of the liver consistent with fatty infiltration. No liver mass or focal lesion. Liver normal in size. Normal gallbladder. No bile duct dilation. Pancreas: Unremarkable. No pancreatic ductal dilatation or surrounding inflammatory changes. Spleen: Normal in size without focal abnormality. Adrenals/Urinary Tract: Adrenal glands are unremarkable. Kidneys are normal, without renal calculi, focal lesion, or hydronephrosis. Bladder is unremarkable. Stomach/Bowel: Stomach unremarkable other than the large hiatal hernia. Small bowel and colon are normal in caliber. No wall thickening. No inflammation. There are colonic diverticula mostly along the left colon. No diverticulitis. Normal appendix visualized. Vascular/Lymphatic: Mild aortic atherosclerosis.  No  aneurysm. No enlarged lymph nodes. Reproductive: Small partly calcified uterine fibroids. Uterus normal in overall size. No ovarian/adnexal masses. Other: No abdominal wall hernia or abnormality. No abdominopelvic ascites. Musculoskeletal: No fracture or acute finding. No osteoblastic or osteolytic lesions. Review of the MIP images confirms the above findings. IMPRESSION: CTA CHEST 1. No evidence of a pulmonary embolism. 2. Bilateral peripheral ground-glass airspace lung opacities with a slight upper lobe predominance. Findings consistent with multifocal infection. Consider atypical infection including viral infection. Noninfectious lung inflammation may also have this appearance. 3. No other acute findings in the chest. 4. Large hiatal hernia. CT ABDOMEN AND PELVIS 1. No acute findings within the abdomen or pelvis. 2. Hepatic steatosis. 3. Aortic atherosclerosis. 4. Scattered colonic diverticula without diverticulitis. Electronically Signed   By: Amie Portland M.D.   On: 01/15/2019 12:01   Dg Chest Port 1 View  Result Date: 01/15/2019 CLINICAL DATA:  Fall 7/25, chest pain/SOB since then. Hx of stroke, lupus, hypertension, diabetes, coronary artery disease, asthma, afib. Nonsmoker. EXAM: PORTABLE CHEST 1 VIEW COMPARISON:  None. FINDINGS: Cardiac silhouette is mildly enlarged. No mediastinal or hilar masses. Subtle hazy airspace opacities are noted in the peripheral left mid and lower lung, possibly in the peripheral right mid lung. There are mildly prominent bronchovascular markings diffusely. No convincing pleural effusion.  No pneumothorax. Skeletal structures are grossly intact.  IMPRESSION: 1. Subtle hazy airspace opacities in the left mid and lower lung and possibly right peripheral mid lung. Consider multifocal pneumonia if there are consistent clinical findings. Electronically Signed   By: Amie Portland M.D.   On: 01/15/2019 10:26   Dg Chest Port 1v Same Day  Result Date: 01/18/2019 CLINICAL DATA:   Shortness of breath EXAM: PORTABLE CHEST 1 VIEW COMPARISON:  January 15, 2019 FINDINGS: The study is extremely limited due to positioning. Patchy airspace opacities seen throughout both lungs, left greater than right. The cardiomediastinal silhouette is obscured. IMPRESSION: Extremely limited exam due to patient positioning. Airspace opacities throughout both lungs. Electronically Signed   By: Jonna Clark M.D.   On: 01/18/2019 11:10      TODAY-DAY OF DISCHARGE:  Subjective:   Swathi Gae Dry today has no headache,no chest abdominal pain,no new weakness tingling or numbness, feels much better wants to go home today.   Objective:   Blood pressure (!) 116/56, pulse 63, temperature 98.2 F (36.8 C), resp. rate (!) 21, height 5\' 4"  (1.626 m), weight 115.6 kg, SpO2 95 %.  Intake/Output Summary (Last 24 hours) at 01/27/2019 1028 Last data filed at 01/27/2019 0617 Gross per 24 hour  Intake 1680 ml  Output --  Net 1680 ml   Filed Weights   01/22/19 0500 01/23/19 0500 01/27/19 0427  Weight: 121.7 kg 120.2 kg 115.6 kg    Exam: Awake Alert, Oriented *3, No new F.N deficits, Normal affect [place].AT,PERRAL Supple Neck,No JVD, No cervical lymphadenopathy appriciated.  Symmetrical Chest wall movement, Good air movement bilaterally, CTAB RRR,No Gallops,Rubs or new Murmurs, No Parasternal Heave +ve B.Sounds, Abd Soft, Non tender, No organomegaly appriciated, No rebound -guarding or rigidity. No Cyanosis, Clubbing or edema, No new Rash or bruise   PERTINENT RADIOLOGIC STUDIES: Ct Angio Chest Pe W And/or Wo Contrast  Result Date: 01/15/2019 CLINICAL DATA:  EMS call for CP and SHOB x 1 week . Pt reported Pain increased with movement. Pt has Dx of A-fib and a ablation the patient . Pt given 324 ASA and one NGT SL . BP 120/70 prior to meds and BP 74/40 after meds. On arrival EMS reported BP 120/70. Pt reports relief of CP From meds. HR 80, CBG 137, 97.2 temp. EXAM: CT ANGIOGRAPHY CHEST CT ABDOMEN AND  PELVIS WITH CONTRAST TECHNIQUE: Multidetector CT imaging of the chest was performed using the standard protocol during bolus administration of intravenous contrast. Multiplanar CT image reconstructions and MIPs were obtained to evaluate the vascular anatomy. Multidetector CT imaging of the abdomen and pelvis was performed using the standard protocol during bolus administration of intravenous contrast. CONTRAST:  OMNIPAQUE IOHEXOL 350 MG/ML SOLN COMPARISON:  None. FINDINGS: CTA CHEST FINDINGS Cardiovascular: There is satisfactory opacification of the pulmonary arteries to the segmental level. There is no evidence of a pulmonary embolism. Heart is normal in size. No pericardial effusion. Minimal three-vessel coronary artery calcifications. Great vessels are normal in caliber. Mild aortic atherosclerosis. No dissection. Mediastinum/Nodes: Large hiatal hernia, lying to the right of the lower thoracic spine. Esophagus unremarkable. Trachea widely patent. Normal thyroid. No neck base, axillary, mediastinal or hilar masses or enlarged lymph nodes. Lungs/Pleura: Bilateral, peripheral ground-glass airspace opacities. Slight upper lobe predominance. No lung masses or suspicious nodules. No interstitial thickening. No pleural effusion or pneumothorax. Musculoskeletal: No fracture or acute finding. No osteoblastic or osteolytic lesions. Review of the MIP images confirms the above findings. CT ABDOMEN and PELVIS FINDINGS Hepatobiliary: Decreased attenuation of the liver consistent with  fatty infiltration. No liver mass or focal lesion. Liver normal in size. Normal gallbladder. No bile duct dilation. Pancreas: Unremarkable. No pancreatic ductal dilatation or surrounding inflammatory changes. Spleen: Normal in size without focal abnormality. Adrenals/Urinary Tract: Adrenal glands are unremarkable. Kidneys are normal, without renal calculi, focal lesion, or hydronephrosis. Bladder is unremarkable. Stomach/Bowel: Stomach  unremarkable other than the large hiatal hernia. Small bowel and colon are normal in caliber. No wall thickening. No inflammation. There are colonic diverticula mostly along the left colon. No diverticulitis. Normal appendix visualized. Vascular/Lymphatic: Mild aortic atherosclerosis.  No aneurysm. No enlarged lymph nodes. Reproductive: Small partly calcified uterine fibroids. Uterus normal in overall size. No ovarian/adnexal masses. Other: No abdominal wall hernia or abnormality. No abdominopelvic ascites. Musculoskeletal: No fracture or acute finding. No osteoblastic or osteolytic lesions. Review of the MIP images confirms the above findings. IMPRESSION: CTA CHEST 1. No evidence of a pulmonary embolism. 2. Bilateral peripheral ground-glass airspace lung opacities with a slight upper lobe predominance. Findings consistent with multifocal infection. Consider atypical infection including viral infection. Noninfectious lung inflammation may also have this appearance. 3. No other acute findings in the chest. 4. Large hiatal hernia. CT ABDOMEN AND PELVIS 1. No acute findings within the abdomen or pelvis. 2. Hepatic steatosis. 3. Aortic atherosclerosis. 4. Scattered colonic diverticula without diverticulitis. Electronically Signed   By: Amie Portlandavid  Ormond M.D.   On: 01/15/2019 12:01   Ct Abdomen Pelvis W Contrast  Result Date: 01/15/2019 CLINICAL DATA:  EMS call for CP and SHOB x 1 week . Pt reported Pain increased with movement. Pt has Dx of A-fib and a ablation the patient . Pt given 324 ASA and one NGT SL . BP 120/70 prior to meds and BP 74/40 after meds. On arrival EMS reported BP 120/70. Pt reports relief of CP From meds. HR 80, CBG 137, 97.2 temp. EXAM: CT ANGIOGRAPHY CHEST CT ABDOMEN AND PELVIS WITH CONTRAST TECHNIQUE: Multidetector CT imaging of the chest was performed using the standard protocol during bolus administration of intravenous contrast. Multiplanar CT image reconstructions and MIPs were obtained to  evaluate the vascular anatomy. Multidetector CT imaging of the abdomen and pelvis was performed using the standard protocol during bolus administration of intravenous contrast. CONTRAST:  100mL OMNIPAQUE IOHEXOL 350 MG/ML SOLN COMPARISON:  None. FINDINGS: CTA CHEST FINDINGS Cardiovascular: There is satisfactory opacification of the pulmonary arteries to the segmental level. There is no evidence of a pulmonary embolism. Heart is normal in size. No pericardial effusion. Minimal three-vessel coronary artery calcifications. Great vessels are normal in caliber. Mild aortic atherosclerosis. No dissection. Mediastinum/Nodes: Large hiatal hernia, lying to the right of the lower thoracic spine. Esophagus unremarkable. Trachea widely patent. Normal thyroid. No neck base, axillary, mediastinal or hilar masses or enlarged lymph nodes. Lungs/Pleura: Bilateral, peripheral ground-glass airspace opacities. Slight upper lobe predominance. No lung masses or suspicious nodules. No interstitial thickening. No pleural effusion or pneumothorax. Musculoskeletal: No fracture or acute finding. No osteoblastic or osteolytic lesions. Review of the MIP images confirms the above findings. CT ABDOMEN and PELVIS FINDINGS Hepatobiliary: Decreased attenuation of the liver consistent with fatty infiltration. No liver mass or focal lesion. Liver normal in size. Normal gallbladder. No bile duct dilation. Pancreas: Unremarkable. No pancreatic ductal dilatation or surrounding inflammatory changes. Spleen: Normal in size without focal abnormality. Adrenals/Urinary Tract: Adrenal glands are unremarkable. Kidneys are normal, without renal calculi, focal lesion, or hydronephrosis. Bladder is unremarkable. Stomach/Bowel: Stomach unremarkable other than the large hiatal hernia. Small bowel and colon are normal in  caliber. No wall thickening. No inflammation. There are colonic diverticula mostly along the left colon. No diverticulitis. Normal appendix  visualized. Vascular/Lymphatic: Mild aortic atherosclerosis.  No aneurysm. No enlarged lymph nodes. Reproductive: Small partly calcified uterine fibroids. Uterus normal in overall size. No ovarian/adnexal masses. Other: No abdominal wall hernia or abnormality. No abdominopelvic ascites. Musculoskeletal: No fracture or acute finding. No osteoblastic or osteolytic lesions. Review of the MIP images confirms the above findings. IMPRESSION: CTA CHEST 1. No evidence of a pulmonary embolism. 2. Bilateral peripheral ground-glass airspace lung opacities with a slight upper lobe predominance. Findings consistent with multifocal infection. Consider atypical infection including viral infection. Noninfectious lung inflammation may also have this appearance. 3. No other acute findings in the chest. 4. Large hiatal hernia. CT ABDOMEN AND PELVIS 1. No acute findings within the abdomen or pelvis. 2. Hepatic steatosis. 3. Aortic atherosclerosis. 4. Scattered colonic diverticula without diverticulitis. Electronically Signed   By: Amie Portland M.D.   On: 01/15/2019 12:01   Dg Chest Port 1 View  Result Date: 01/15/2019 CLINICAL DATA:  Fall 7/25, chest pain/SOB since then. Hx of stroke, lupus, hypertension, diabetes, coronary artery disease, asthma, afib. Nonsmoker. EXAM: PORTABLE CHEST 1 VIEW COMPARISON:  None. FINDINGS: Cardiac silhouette is mildly enlarged. No mediastinal or hilar masses. Subtle hazy airspace opacities are noted in the peripheral left mid and lower lung, possibly in the peripheral right mid lung. There are mildly prominent bronchovascular markings diffusely. No convincing pleural effusion.  No pneumothorax. Skeletal structures are grossly intact. IMPRESSION: 1. Subtle hazy airspace opacities in the left mid and lower lung and possibly right peripheral mid lung. Consider multifocal pneumonia if there are consistent clinical findings. Electronically Signed   By: Amie Portland M.D.   On: 01/15/2019 10:26   Dg Chest  Port 1v Same Day  Result Date: 01/18/2019 CLINICAL DATA:  Shortness of breath EXAM: PORTABLE CHEST 1 VIEW COMPARISON:  January 15, 2019 FINDINGS: The study is extremely limited due to positioning. Patchy airspace opacities seen throughout both lungs, left greater than right. The cardiomediastinal silhouette is obscured. IMPRESSION: Extremely limited exam due to patient positioning. Airspace opacities throughout both lungs. Electronically Signed   By: Jonna Clark M.D.   On: 01/18/2019 11:10     PERTINENT LAB RESULTS: CBC: Recent Labs    01/26/19 0120 01/27/19 0305  WBC 12.2* 10.0  HGB 14.0 14.2  HCT 42.1 43.3  PLT 131* 121*   CMET CMP     Component Value Date/Time   NA 136 01/27/2019 0305   NA 141 07/27/2018 1653   K 4.4 01/27/2019 0305   CL 94 (L) 01/27/2019 0305   CO2 32 01/27/2019 0305   GLUCOSE 65 (L) 01/27/2019 0305   BUN 31 (H) 01/27/2019 0305   BUN 18 07/27/2018 1653   CREATININE 1.07 (H) 01/27/2019 0305   CALCIUM 7.9 (L) 01/27/2019 0305   PROT 6.0 (L) 01/27/2019 0305   PROT 7.0 07/27/2018 1653   ALBUMIN 2.4 (L) 01/27/2019 0305   ALBUMIN 3.4 (L) 07/27/2018 1653   AST 28 01/27/2019 0305   ALT 33 01/27/2019 0305   ALKPHOS 58 01/27/2019 0305   BILITOT 1.5 (H) 01/27/2019 0305   BILITOT 0.3 07/27/2018 1653   GFRNONAA 55 (L) 01/27/2019 0305   GFRAA >60 01/27/2019 0305    GFR Estimated Creatinine Clearance: 67.2 mL/min (A) (by C-G formula based on SCr of 1.07 mg/dL (H)). No results for input(s): LIPASE, AMYLASE in the last 72 hours. No results for input(s): CKTOTAL, CKMB,  CKMBINDEX, TROPONINI in the last 72 hours. Invalid input(s): POCBNP Recent Labs    01/26/19 0120 01/27/19 0305  DDIMER 3.20* 2.59*   No results for input(s): HGBA1C in the last 72 hours. No results for input(s): CHOL, HDL, LDLCALC, TRIG, CHOLHDL, LDLDIRECT in the last 72 hours. No results for input(s): TSH, T4TOTAL, T3FREE, THYROIDAB in the last 72 hours.  Invalid input(s): FREET3 Recent Labs     01/26/19 0120 01/27/19 0305  FERRITIN 151 136   Coags: No results for input(s): INR in the last 72 hours.  Invalid input(s): PT Microbiology: No results found for this or any previous visit (from the past 240 hour(s)).  FURTHER DISCHARGE INSTRUCTIONS:  Get Medicines reviewed and adjusted: Please take all your medications with you for your next visit with your Primary MD  Laboratory/radiological data: Please request your Primary MD to go over all hospital tests and procedure/radiological results at the follow up, please ask your Primary MD to get all Hospital records sent to his/her office.  In some cases, they will be blood work, cultures and biopsy results pending at the time of your discharge. Please request that your primary care M.D. goes through all the records of your hospital data and follows up on these results.  Also Note the following: If you experience worsening of your admission symptoms, develop shortness of breath, life threatening emergency, suicidal or homicidal thoughts you must seek medical attention immediately by calling 911 or calling your MD immediately  if symptoms less severe.  You must read complete instructions/literature along with all the possible adverse reactions/side effects for all the Medicines you take and that have been prescribed to you. Take any new Medicines after you have completely understood and accpet all the possible adverse reactions/side effects.   Do not drive when taking Pain medications or sleeping medications (Benzodaizepines)  Do not take more than prescribed Pain, Sleep and Anxiety Medications. It is not advisable to combine anxiety,sleep and pain medications without talking with your primary care practitioner  Special Instructions: If you have smoked or chewed Tobacco  in the last 2 yrs please stop smoking, stop any regular Alcohol  and or any Recreational drug use.  Wear Seat belts while driving.  Please note: You were cared  for by a hospitalist during your hospital stay. Once you are discharged, your primary care physician will handle any further medical issues. Please note that NO REFILLS for any discharge medications will be authorized once you are discharged, as it is imperative that you return to your primary care physician (or establish a relationship with a primary care physician if you do not have one) for your post hospital discharge needs so that they can reassess your need for medications and monitor your lab values.  Total Time spent coordinating discharge including counseling, education and face to face time equals 35 minutes.  SignedJeoffrey Massed 01/27/2019 10:28 AM

## 2019-07-25 ENCOUNTER — Telehealth: Payer: Self-pay | Admitting: *Deleted

## 2019-07-25 NOTE — Telephone Encounter (Signed)
I spoke with the patient on yesterday and is insisting on getting the vaccine ,I explained to her we are vaccinating patient's 65 and older ,she is 64 . She requested I list her medical conditions asthma, lupus, heart disease she states she is currently on oxygen .Ms Lissa Hoard also states she was at [facility] for 12 days with covid and her family was called in  due to her being so sick .She has also advised she will call the CDC with the information due to her being told she is sick enough to get the vaccine now if she does not get one ,she wants to speak with management, Patient has been advised of state guide lines.

## 2019-08-10 ENCOUNTER — Ambulatory Visit: Payer: Self-pay | Attending: Internal Medicine

## 2019-08-10 DIAGNOSIS — Z23 Encounter for immunization: Secondary | ICD-10-CM | POA: Insufficient documentation

## 2019-08-10 NOTE — Progress Notes (Signed)
   Covid-19 Vaccination Clinic  Name:  Yakira Duquette    MRN: [id number] DOB: 1955-10-20  08/10/2019  Ms. Gae Dry was observed post Covid-19 immunization for 15 minutes without incidence. She was provided with Vaccine Information Sheet and instruction to access the V-Safe system.   Ms. Gae Dry was instructed to call 911 with any severe reactions post vaccine: Marland Kitchen Difficulty breathing  . Swelling of your face and throat  . A fast heartbeat  . A bad rash all over your body  . Dizziness and weakness    Immunizations Administered    Name Date Dose VIS Date Route   Pfizer COVID-19 Vaccine 08/10/2019  4:24 PM 0.3 mL 05/26/2019 Intramuscular   Manufacturer: [employer], [employer]   Lot: [id number]   NDC: [address]-5

## 2019-09-05 ENCOUNTER — Ambulatory Visit: Payer: Self-pay | Attending: Internal Medicine

## 2019-09-05 DIAGNOSIS — Z23 Encounter for immunization: Secondary | ICD-10-CM

## 2019-09-05 NOTE — Progress Notes (Signed)
   Covid-19 Vaccination Clinic  Name:  Dwanna Goshert    MRN: [id number] DOB: 03-26-1956  09/05/2019  Ms. Gae Dry was observed post Covid-19 immunization for 15 minutes without incident. She was provided with Vaccine Information Sheet and instruction to access the V-Safe system.   Ms. Gae Dry was instructed to call 911 with any severe reactions post vaccine: Marland Kitchen Difficulty breathing  . Swelling of face and throat  . A fast heartbeat  . A bad rash all over body  . Dizziness and weakness   Immunizations Administered    Name Date Dose VIS Date Route   Pfizer COVID-19 Vaccine 09/05/2019  2:32 PM 0.3 mL 05/26/2019 Intramuscular   Manufacturer: [employer], [employer]   Lot: [id number]   NDC: [address]-2

## 2020-06-27 ENCOUNTER — Emergency Department (HOSPITAL_COMMUNITY)
Admission: EM | Admit: 2020-06-27 | Discharge: 2020-06-27 | Disposition: A | Discharge status: Home / Self Care | Payer: BLUE CROSS/BLUE SHIELD | Attending: Emergency Medicine | Admitting: Emergency Medicine

## 2020-06-27 ENCOUNTER — Encounter (HOSPITAL_COMMUNITY): Payer: Self-pay

## 2020-06-27 ENCOUNTER — Other Ambulatory Visit: Payer: Self-pay

## 2020-06-27 DIAGNOSIS — R040 Epistaxis: Secondary | ICD-10-CM | POA: Diagnosis not present

## 2020-06-27 DIAGNOSIS — Z5321 Procedure and treatment not carried out due to patient leaving prior to being seen by health care provider: Secondary | ICD-10-CM | POA: Diagnosis not present

## 2020-06-27 NOTE — ED Triage Notes (Signed)
Patient states she had a nose bleed yesterday. Patient states she held her"head back and the blood ran to her stomach and began vomiting."

## 2020-12-12 IMAGING — DX PORTABLE CHEST - 1 VIEW
1 series · 1 of 1 positions shown · non-contrast
Comparison: None.

CLINICAL DATA: Fall [DATE], chest pain/SOB since then. Hx of stroke,
lupus, hypertension, diabetes, coronary artery disease, asthma,
afib. Nonsmoker.

EXAM:
PORTABLE CHEST 1 VIEW

[chest ap]
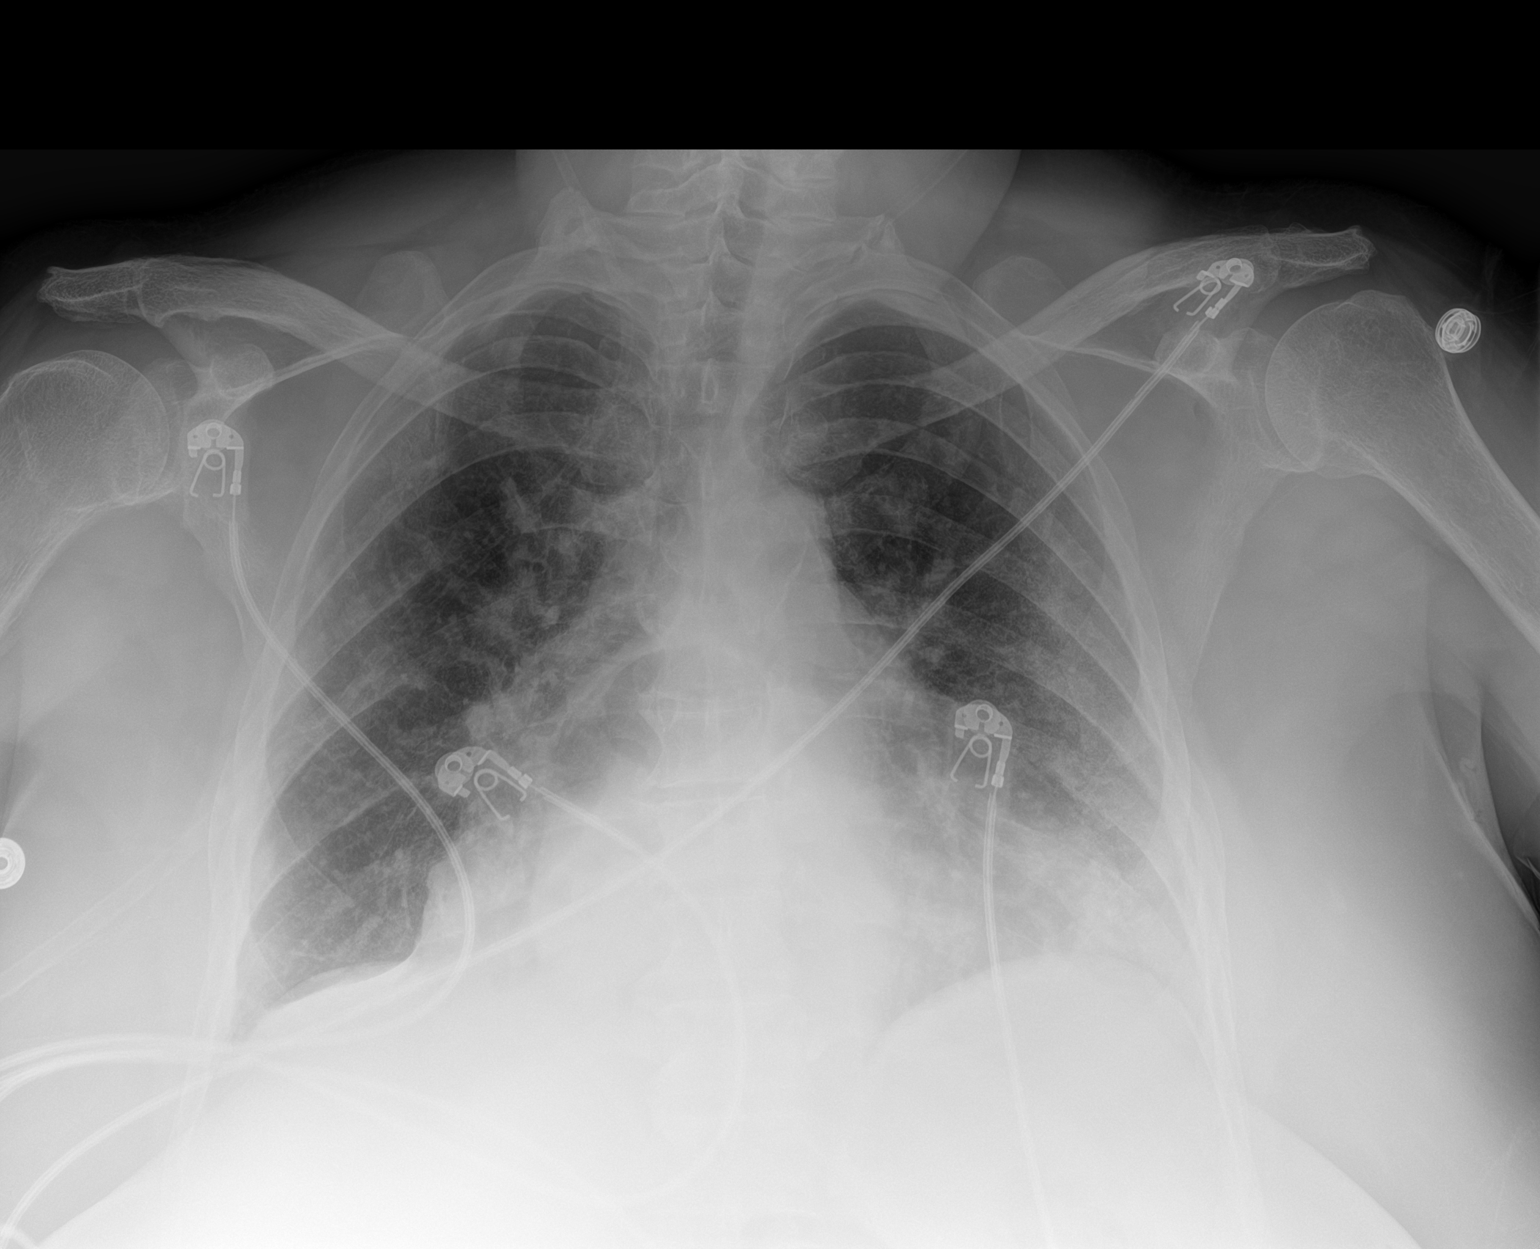

[1 of 1 positions shown; findings below may reference images not displayed]

FINDINGS: Cardiac silhouette is mildly enlarged. No mediastinal or hilar
masses.

Subtle hazy airspace opacities are noted in the peripheral left mid
and lower lung, possibly in the peripheral right mid lung. There are
mildly prominent bronchovascular markings diffusely.

No convincing pleural effusion.  No pneumothorax.

Skeletal structures are grossly intact.
IMPRESSION: 1. Subtle hazy airspace opacities in the left mid and lower lung and
possibly right peripheral mid lung. Consider multifocal pneumonia if
there are consistent clinical findings.

## 2020-12-15 IMAGING — DX PORTABLE CHEST - 1 VIEW SAME DAY
1 series · 1 of 1 positions shown · non-contrast
Comparison: January 15, 2019

CLINICAL DATA: Shortness of breath

EXAM:
PORTABLE CHEST 1 VIEW

[chest]
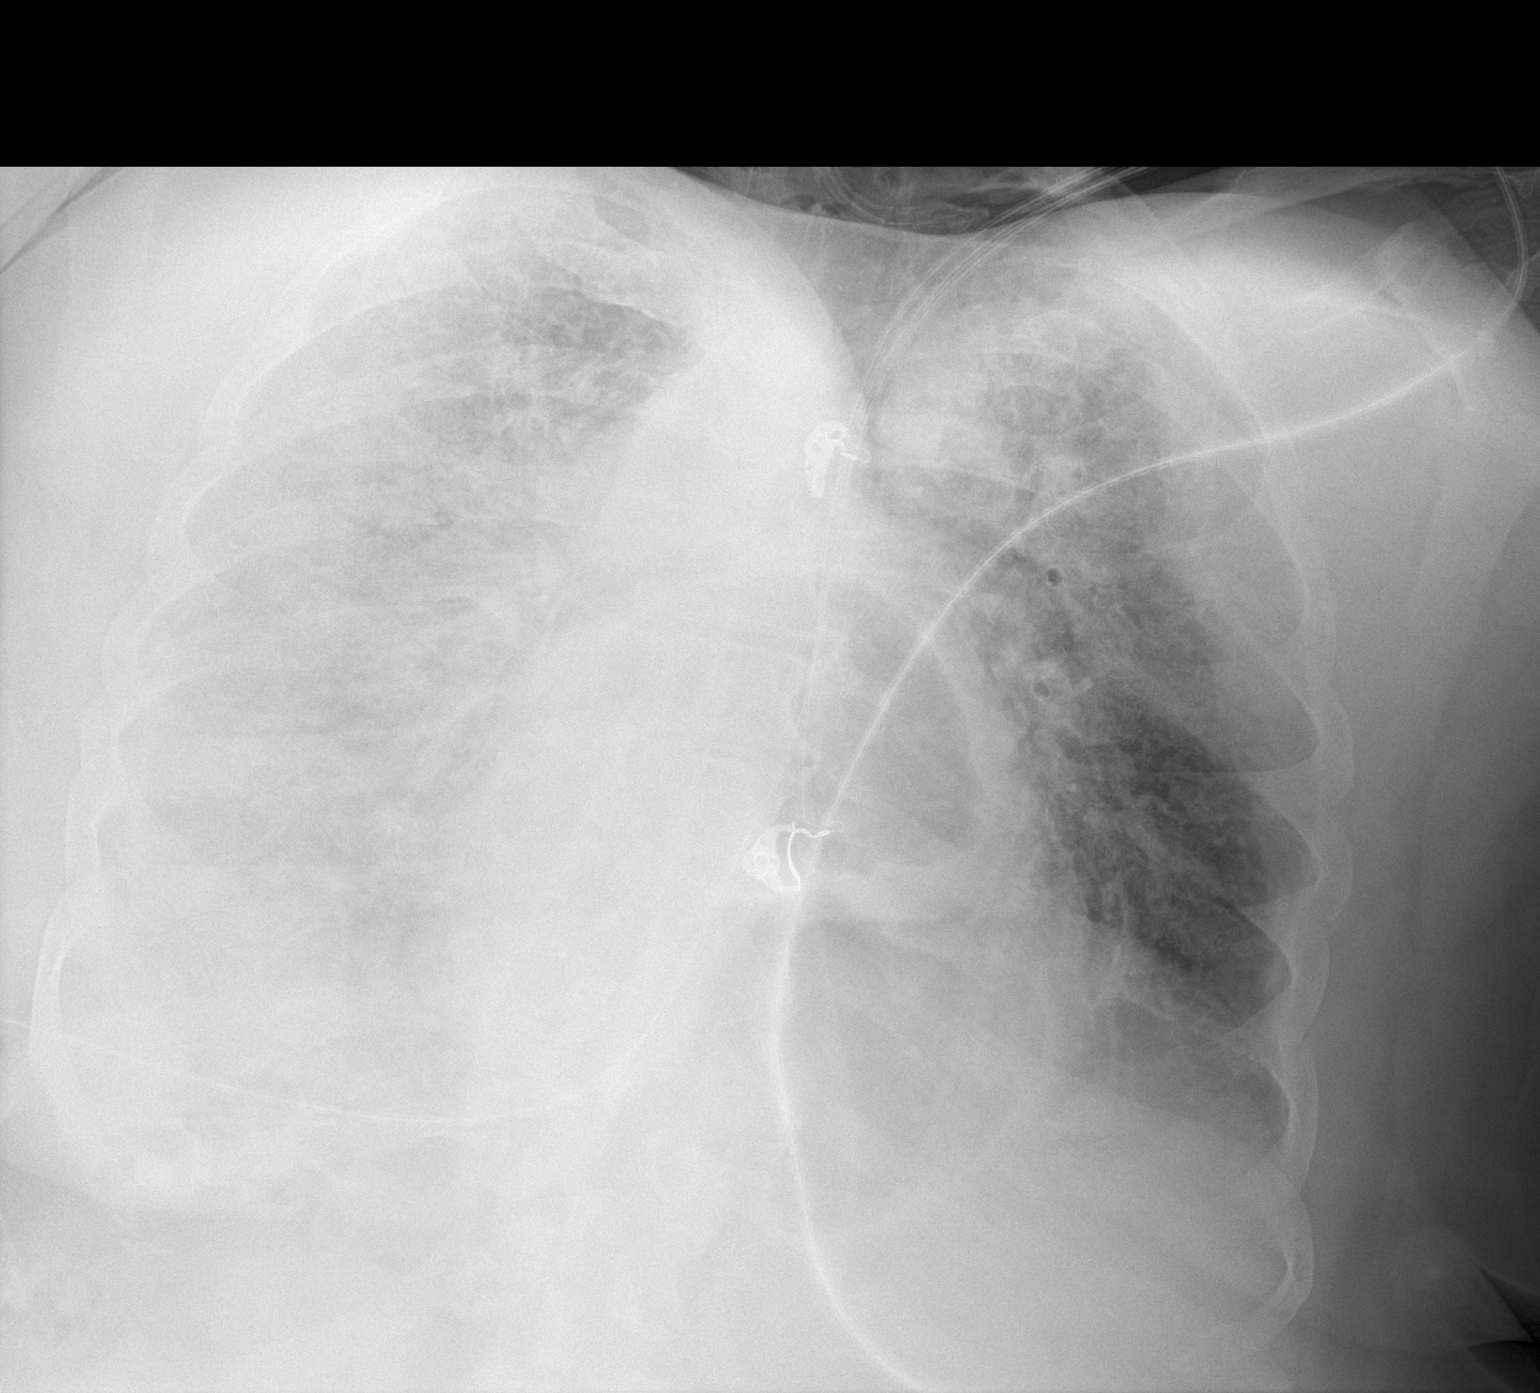

[1 of 1 positions shown; findings below may reference images not displayed]

FINDINGS: The study is extremely limited due to positioning. Patchy airspace
opacities seen throughout both lungs, left greater than right. The
cardiomediastinal silhouette is obscured.
IMPRESSION: Extremely limited exam due to patient positioning. Airspace
opacities throughout both lungs.

## 2021-07-23 ENCOUNTER — Other Ambulatory Visit: Payer: Self-pay

## 2021-07-23 ENCOUNTER — Emergency Department (HOSPITAL_COMMUNITY)
Admission: EM | Admit: 2021-07-23 | Discharge: 2021-07-23 | Disposition: A | Discharge status: Home / Self Care | Payer: Medicare Other | Attending: Emergency Medicine | Admitting: Emergency Medicine

## 2021-07-23 ENCOUNTER — Encounter (HOSPITAL_COMMUNITY): Payer: Self-pay | Admitting: Emergency Medicine

## 2021-07-23 ENCOUNTER — Emergency Department (HOSPITAL_BASED_OUTPATIENT_CLINIC_OR_DEPARTMENT_OTHER)
Admission: EM | Admit: 2021-07-23 | Discharge: 2021-07-23 | Disposition: A | Discharge status: Home / Self Care | Payer: Medicare Other | Source: Home / Self Care | Attending: Emergency Medicine | Admitting: Emergency Medicine

## 2021-07-23 ENCOUNTER — Emergency Department (HOSPITAL_COMMUNITY): Payer: Medicare Other

## 2021-07-23 DIAGNOSIS — Z79899 Other long term (current) drug therapy: Secondary | ICD-10-CM | POA: Diagnosis not present

## 2021-07-23 DIAGNOSIS — Z7901 Long term (current) use of anticoagulants: Secondary | ICD-10-CM | POA: Insufficient documentation

## 2021-07-23 DIAGNOSIS — Z794 Long term (current) use of insulin: Secondary | ICD-10-CM | POA: Diagnosis not present

## 2021-07-23 DIAGNOSIS — M7989 Other specified soft tissue disorders: Secondary | ICD-10-CM

## 2021-07-23 DIAGNOSIS — R7401 Elevation of levels of liver transaminase levels: Secondary | ICD-10-CM | POA: Diagnosis not present

## 2021-07-23 DIAGNOSIS — W19XXXA Unspecified fall, initial encounter: Secondary | ICD-10-CM | POA: Insufficient documentation

## 2021-07-23 DIAGNOSIS — S20211A Contusion of right front wall of thorax, initial encounter: Secondary | ICD-10-CM | POA: Diagnosis not present

## 2021-07-23 DIAGNOSIS — M25562 Pain in left knee: Secondary | ICD-10-CM | POA: Insufficient documentation

## 2021-07-23 DIAGNOSIS — R2242 Localized swelling, mass and lump, left lower limb: Secondary | ICD-10-CM | POA: Insufficient documentation

## 2021-07-23 DIAGNOSIS — R0602 Shortness of breath: Secondary | ICD-10-CM | POA: Insufficient documentation

## 2021-07-23 DIAGNOSIS — R001 Bradycardia, unspecified: Secondary | ICD-10-CM | POA: Insufficient documentation

## 2021-07-23 DIAGNOSIS — S29001A Unspecified injury of muscle and tendon of front wall of thorax, initial encounter: Secondary | ICD-10-CM | POA: Diagnosis present

## 2021-07-23 LAB — CBC
HCT: 41.5 % (ref 36.0–46.0)
Hemoglobin: 13.4 g/dL (ref 12.0–15.0)
MCH: 32.7 pg (ref 26.0–34.0)
MCHC: 32.3 g/dL (ref 30.0–36.0)
MCV: 101.2 fL — ABNORMAL HIGH (ref 80.0–100.0)
Platelets: 160 10*3/uL (ref 150–400)
RBC: 4.1 MIL/uL (ref 3.87–5.11)
RDW: 20 % — ABNORMAL HIGH (ref 11.5–15.5)
WBC: 5.8 10*3/uL (ref 4.0–10.5)
nRBC: 0 % (ref 0.0–0.2)

## 2021-07-23 LAB — COMPREHENSIVE METABOLIC PANEL
ALT: 36 U/L (ref 0–44)
AST: 54 U/L — ABNORMAL HIGH (ref 15–41)
Albumin: 3 g/dL — ABNORMAL LOW (ref 3.5–5.0)
Alkaline Phosphatase: 145 U/L — ABNORMAL HIGH (ref 38–126)
Anion gap: 6 (ref 5–15)
BUN: 17 mg/dL (ref 8–23)
CO2: 28 mmol/L (ref 22–32)
Calcium: 8.4 mg/dL — ABNORMAL LOW (ref 8.9–10.3)
Chloride: 106 mmol/L (ref 98–111)
Creatinine, Ser: 1.02 mg/dL — ABNORMAL HIGH (ref 0.44–1.00)
GFR, Estimated: >60 mL/min (ref >60)
Glucose, Bld: 73 mg/dL (ref 70–99)
Potassium: 3.9 mmol/L (ref 3.5–5.1)
Sodium: 140 mmol/L (ref 135–145)
Total Bilirubin: 0.8 mg/dL (ref 0.3–1.2)
Total Protein: 7.5 g/dL (ref 6.5–8.1)

## 2021-07-23 LAB — URINALYSIS, ROUTINE W REFLEX MICROSCOPIC
Bacteria, UA: NONE SEEN
Bilirubin Urine: NEGATIVE
Glucose, UA: NEGATIVE mg/dL
Hgb urine dipstick: NEGATIVE
Ketones, ur: NEGATIVE mg/dL
Nitrite: NEGATIVE
Protein, ur: 30 mg/dL — AB
Specific Gravity, Urine: 1.016 (ref 1.005–1.030)
pH: 7 (ref 5.0–8.0)

## 2021-07-23 MED ORDER — HYDROCODONE-ACETAMINOPHEN 5-325 MG PO TABS
1.0000 | ORAL_TABLET | Freq: Four times a day (QID) | ORAL | 0 refills | Status: DC | PRN
Start: 2021-07-23 — End: 2021-09-15

## 2021-07-23 MED ORDER — HYDROCODONE-ACETAMINOPHEN 5-325 MG PO TABS: 1.0000 | ORAL_TABLET | Freq: Four times a day (QID) | ORAL | 0 refills | Status: DC | PRN

## 2021-07-23 NOTE — ED Triage Notes (Signed)
Patient reports RUQ abdominal pain and feeling her heart beat, feels like it is racing. HR 45 on monitor. Reports SOB w/ exertion.

## 2021-07-23 NOTE — ED Provider Notes (Signed)
[facility]-EMERGENCY DEPT Provider Note   CSN: [id number] Arrival date & time: 07/23/21  1629     History  Chief Complaint  Patient presents with   Abdominal Pain   Shortness of Breath    Stacey Burns is a 66 y.o. female.   Abdominal Pain Associated symptoms: shortness of breath   Associated symptoms: no fever   Shortness of Breath Associated symptoms: abdominal pain   Associated symptoms: no fever     Pt states she has been having pain in her back for a couple of weeks now.  The pain is in her back of her ribs.  She also has been noticing that her heart rate has been running low.  She has been having trouble with falls and wonder if she hurt it when she fell. No fever.  No vomiting.  No abdomen pain.  The pain is in the right flank area.  It increases with walking.  Intermittent falls.  Thinks she is just stumbling.  No syncope or pre syncope  Pt did see her pcp in oct.  She had some xrays but everything ok. Pt also saw her cardiologist in January and has further evaluation scheduled.  She is also going to see a pulmonary doctor for her shortness of breath.  No new changes with that.  Some swelling in the left leg.  More than usual.  Some pain in her left leg when walking.   Pt did have xrays at her doctors office.  Home Medications Prior to Admission medications   Medication Sig Start Date End Date Taking? Authorizing Provider  acetaminophen (TYLENOL) 325 MG tablet Take 650 mg by mouth every 6 (six) hours as needed for pain or moderate pain. 01/11/19  Yes [provider]  albuterol (VENTOLIN HFA) 108 (90 Base) MCG/ACT inhaler Inhale 2 puffs into the lungs every 6 (six) hours as needed for wheezing or shortness of breath.   Yes [provider]  amiodarone (PACERONE) 200 MG tablet Take 1 tablet (200 mg total) by mouth daily. 01/27/19  Yes Ghimire, Werner Lean, MD  amLODipine (NORVASC) 10 MG tablet Take 5 mg by mouth daily.   Yes  [provider]  apixaban (ELIQUIS) 5 MG TABS tablet Take 1 tablet (5 mg total) by mouth 2 (two) times daily. 08/29/18  Yes Burns, Stacey N, DO  atorvastatin (LIPITOR) 80 MG tablet Take 80 mg by mouth daily. 07/05/21  Yes [provider]  azaTHIOprine (IMURAN) 50 MG tablet Take 175 mg by mouth every morning. 01/11/19  Yes [provider]  baclofen (LIORESAL) 10 MG tablet Take 1 tablet (10 mg total) by mouth at bedtime. 08/02/18  Yes Burns, Stacey N, DO  Dulaglutide (TRULICITY) 0.75 MG/0.5ML SOPN Inject 0.75 mg into the skin every 14 (fourteen) days.   Yes [provider]  furosemide (LASIX) 40 MG tablet Take 1 tablet (40 mg total) by mouth daily. Patient taking differently: Take 20 mg by mouth daily. 08/02/18  Yes Allayne Stack, DO  HYDROcodone-acetaminophen (NORCO/VICODIN) 5-325 MG tablet Take 1 tablet by mouth every 6 (six) hours as needed. 07/23/21  Yes Linwood Dibbles, MD  insulin NPH-regular Human (70-30) 100 UNIT/ML injection 28 units with breakfast, 15 units with dinner Patient taking differently: 10-23 Units See admin instructions. 23 units with breakfast, 10 units with dinner 08/02/18  Yes Burns, Stacey N, DO  lisinopril (PRINIVIL,ZESTRIL) 40 MG tablet Take 1 tablet (40 mg total) by mouth daily. 08/02/18  Yes Allayne Stack,  DO  omeprazole (PRILOSEC) 20 MG capsule Take 1 capsule (20 mg total) by mouth daily. 08/02/18  Yes Burns, Stacey N, DO  ondansetron (ZOFRAN-ODT) 4 MG disintegrating tablet Take 4 mg by mouth every 8 (eight) hours as needed for vomiting or nausea. 06/26/21  Yes [provider]  glucose blood test strip Use as instructed 08/02/18   Allayne Stack, DO  insulin NPH Human (HUMULIN N,NOVOLIN N) 100 UNIT/ML injection Inject into skin, sliding scale.  <150 0 U  150-199: 2U  200-249: 4 U 250-299: 6 U  300-349: 8 U  350-400: 10 U  > 400 call doc Patient not taking: Reported on 07/23/2021 08/02/18   Allayne Stack, DO  Insulin  Syringe-Needle U-100 30G X 5/16" 0.3 ML MISC 2 each by Does not apply route 2 (two) times daily. 08/02/18   Allayne Stack, DO  predniSONE (DELTASONE) 10 MG tablet Take 30 mg daily for 1 day, 20 mg daily for 1 day, 10 mg daily for 1 , then stop Patient not taking: Reported on 07/23/2021 01/27/19   Maretta Bees, MD  tiotropium (SPIRIVA HANDIHALER) 18 MCG inhalation capsule Place 1 capsule (18 mcg total) into inhaler and inhale daily. Patient not taking: Reported on 01/15/2019 08/02/18 10/01/18  Allayne Stack, DO      Allergies    Penicillins    Review of Systems   Review of Systems  Constitutional:  Negative for fever.  Respiratory:  Positive for shortness of breath.   Gastrointestinal:  Positive for abdominal pain.   Physical Exam Updated Vital Signs BP (!) 160/66    Pulse (!) 41    Temp 98.7 F (37.1 C) (Oral)    Resp 15    Ht 1.626 m (5\' 4" )    Wt 117 kg    SpO2 100%    BMI 44.27 kg/m  Physical Exam Vitals and nursing note reviewed.  Constitutional:      General: She is not in acute distress.    Appearance: She is well-developed.  HENT:     Head: Normocephalic and atraumatic.     Right Ear: External ear normal.     Left Ear: External ear normal.  Eyes:     General: No scleral icterus.       Right eye: No discharge.        Left eye: No discharge.     Conjunctiva/sclera: Conjunctivae normal.  Neck:     Trachea: No tracheal deviation.  Cardiovascular:     Rate and Rhythm: Normal rate and regular rhythm.  Pulmonary:     Effort: Pulmonary effort is normal. No respiratory distress.     Breath sounds: Normal breath sounds. No stridor. No wheezing or rales.  Chest:     Chest wall: Tenderness present.  Breasts:    Left: Inverted nipple: right side.  Abdominal:     General: Bowel sounds are normal. There is no distension.     Palpations: Abdomen is soft.     Tenderness: There is no abdominal tenderness. There is no guarding or rebound.  Musculoskeletal:        General:  No tenderness or deformity.     Cervical back: Neck supple.     Left lower leg: Swelling present.     Comments: Mild ttp left knee   Skin:    General: Skin is warm and Burns.     Findings: No rash.  Neurological:     General: No focal deficit present.  Mental Status: She is alert.     Cranial Nerves: No cranial nerve deficit (no facial droop, extraocular movements intact, no slurred speech).     Sensory: No sensory deficit.     Motor: No abnormal muscle tone or seizure activity.     Coordination: Coordination normal.  Psychiatric:        Mood and Affect: Mood normal.    ED Results / Procedures / Treatments   Labs (all labs ordered are listed, but only abnormal results are displayed) Labs Reviewed  CBC - Abnormal; Notable for the following components:      Result Value   MCV 101.2 (*)    RDW 20.0 (*)    All other components within normal limits  COMPREHENSIVE METABOLIC PANEL - Abnormal; Notable for the following components:   Creatinine, Ser 1.02 (*)    Calcium 8.4 (*)    Albumin 3.0 (*)    AST 54 (*)    Alkaline Phosphatase 145 (*)    All other components within normal limits  URINALYSIS, ROUTINE W REFLEX MICROSCOPIC - Abnormal; Notable for the following components:   Protein, ur 30 (*)    Leukocytes,Ua TRACE (*)    All other components within normal limits    EKG EKG Interpretation  Date/Time:  Wednesday July 23 2021 17:02:46 EST Ventricular Rate:  47 PR Interval:  196 QRS Duration: 98 QT Interval:  522 QTC Calculation: 462 R Axis:   39 Text Interpretation: Sinus bradycardia Since last tracing rate slower Confirmed by Linwood DibblesKnapp, Delona Clasby [phone]([id number]) on 07/23/2021 5:30:25 PM  Radiology DG Ribs Unilateral W/Chest Right  Result Date: 07/23/2021 CLINICAL DATA:  Multiple falls over the past 2 weeks.  Pain. EXAM: RIGHT RIBS AND CHEST - 3+ VIEW COMPARISON:  01/15/2019 FINDINGS: Cardiac enlargement. Prominent soft tissue in the right cardiophrenic angle may represent atelectasis,  prominent fat pad, or pericardial cyst. No change since prior study. No pleural effusions. No pneumothorax. Aortic calcification. The right ribs appear intact. No acute displaced fractures are demonstrated. No focal bone lesion or bone destruction. Bone cortex appears intact. IMPRESSION: Cardiac enlargement.  No acute displaced right rib fractures. Electronically Signed   By: Burman NievesWilliam  Stevens M.D.   On: 07/23/2021 18:57   VAS US LOWER EXTREMITY VENOUS (DVT) (7a-7p)  Result Date: 07/23/2021  Lower Venous DVT Study Patient Name:  Stacey Burns  Date of Exam:   07/23/2021 Medical Rec #: [id number]             Accession #:    [id number] Date of Birth: [birth year]             Patient Gender: F Patient Age:   [age] years [facility] Procedure:      VAS US LOWER EXTREMITY VENOUS (DVT) Referring Phys: Danylah Holden --------------------------------------------------------------------------------  Indications: Swelling.  Risk Factors: None identified. Limitations: Poor ultrasound/tissue interface. Comparison Study: No prior studies. Performing Technologist: Chanda BusingGregory Collins RVT  Examination Guidelines: A complete evaluation includes B-mode imaging, spectral Doppler, color Doppler, and power Doppler as needed of all accessible portions of each vessel. Bilateral testing is considered an integral part of a complete examination. Limited examinations for reoccurring indications may be performed as noted. The reflux portion of the exam is performed with the patient in reverse Trendelenburg.  +-----+---------------+---------+-----------+----------+--------------+  RIGHT Compressibility Phasicity Spontaneity Properties Thrombus Aging  +-----+---------------+---------+-----------+----------+--------------+  CFV   Full            Yes       Yes                                    +-----+---------------+---------+-----------+----------+--------------+    +---------+---------------+---------+-----------+----------+--------------+  LEFT      Compressibility Phasicity Spontaneity Properties Thrombus Aging  +---------+---------------+---------+-----------+----------+--------------+  CFV       Full            Yes       Yes                                    +---------+---------------+---------+-----------+----------+--------------+  SFJ       Full                                                             +---------+---------------+---------+-----------+----------+--------------+  FV Prox   Full                                                             +---------+---------------+---------+-----------+----------+--------------+  FV Mid    Full                                                             +---------+---------------+---------+-----------+----------+--------------+  FV Distal Full                                                             +---------+---------------+---------+-----------+----------+--------------+  PFV       Full                                                             +---------+---------------+---------+-----------+----------+--------------+  POP       Full            Yes       Yes                                    +---------+---------------+---------+-----------+----------+--------------+  PTV       Full                                                             +---------+---------------+---------+-----------+----------+--------------+  PERO      Full                                                             +---------+---------------+---------+-----------+----------+--------------+  Summary: RIGHT: - No evidence of common femoral vein obstruction.  LEFT: - There is no evidence of deep vein thrombosis in the lower extremity.  - No cystic structure found in the popliteal fossa.  *See table(s) above for measurements and observations.    Preliminary     Procedures Procedures    Medications Ordered in ED Medications - No  data to display  ED Course/ Medical Decision Making/ A&P Clinical Course as of 07/23/21 2041  Wed Jul 23, 2021  1831 Doppler study negative for DVT [JK]  1831 CBC(!) CBC normal [JK]  1936 Chest x-ray does not show any acute rib fractures per radiologist report [JK]  2037 Urinalysis without signs of infection.  Metabolic panel looks [JK]    Clinical Course User Index [JK] Linwood Dibbles, MD                           Medical Decision Making Amount and/or Complexity of Data Reviewed External Data Reviewed: notes.    Details: Prior records from [facility] visits Labs: ordered. Decision-making details documented in ED Course. Radiology: ordered. ECG/medicine tests: ordered.  Risk Prescription drug management.   Right-sided rib pain.  Patient fell recently was concerned that she may have broken a rib.  Fortunately no signs of rib fractures on x-ray.  No signs of pneumonia.  Laboratory tests are otherwise unremarkable.  No anemia.  Slight crease in her LFTs with the patient's not having any right upper quadrant tenderness.  I doubt biliary etiology.  Patient does have sinus bradycardia.  She is not having any syncopal episodes she is able ambulate without difficulty.  Recommend close outpatient follow-up with her cardiologist.  Evaluation and diagnostic testing in the emergency department does not suggest an emergent condition requiring admission or immediate intervention beyond what has been performed at this time.  The patient is safe for discharge and has been instructed to return immediately for worsening symptoms, change in symptoms or any other concerns.         Final Clinical Impression(s) / ED Diagnoses Final diagnoses:  Contusion of rib on right side, initial encounter  Sinus bradycardia    Rx / DC Orders ED Discharge Orders          Ordered    HYDROcodone-acetaminophen (NORCO/VICODIN) 5-325 MG tablet  Every 6 hours PRN        07/23/21 2038              Linwood Dibbles,  MD 07/23/21 2042

## 2021-07-23 NOTE — ED Notes (Signed)
Pt states that she has had multiple falls over the past 2wks. States she doesn't know what causes her to fall.   Pt is complaining mostly about right side back pain

## 2021-07-23 NOTE — Progress Notes (Signed)
Left lower extremity venous duplex has been completed. Preliminary results can be found in CV Proc through chart review.  Results were given to Dr. Lynelle Doctor.  07/23/21 6:30 PM Olen Cordial RVT

## 2021-07-23 NOTE — Discharge Instructions (Addendum)
The test today in the ED were reassuring.  No signs of rib fractures or pneumonia.  No signs of urinary tract infection.  Your heart rate is slower than normal but your blood pressure is fine..  Please follow-up with your cardiologist to discuss further valuation.

## 2021-07-23 NOTE — ED Notes (Signed)
In & out no needed d/t patient voiding on her own

## 2021-09-15 ENCOUNTER — Encounter: Payer: Self-pay | Admitting: Cardiology

## 2021-09-15 ENCOUNTER — Ambulatory Visit: Payer: Medicare Other | Admitting: Cardiology

## 2021-09-15 VITALS — BP 120/51 | HR 42 | Temp 96.4°F | Resp 16 | Ht 64.0 in | Wt 248.0 lb

## 2021-09-15 DIAGNOSIS — I480 Paroxysmal atrial fibrillation: Secondary | ICD-10-CM

## 2021-09-15 DIAGNOSIS — I2510 Atherosclerotic heart disease of native coronary artery without angina pectoris: Secondary | ICD-10-CM

## 2021-09-15 DIAGNOSIS — I700 Atherosclerosis of aorta: Secondary | ICD-10-CM

## 2021-09-15 DIAGNOSIS — R001 Bradycardia, unspecified: Secondary | ICD-10-CM

## 2021-09-15 DIAGNOSIS — Z8673 Personal history of transient ischemic attack (TIA), and cerebral infarction without residual deficits: Secondary | ICD-10-CM

## 2021-09-15 DIAGNOSIS — Z794 Long term (current) use of insulin: Secondary | ICD-10-CM

## 2021-09-15 DIAGNOSIS — N1832 Chronic kidney disease, stage 3b: Secondary | ICD-10-CM

## 2021-09-15 DIAGNOSIS — Z7901 Long term (current) use of anticoagulants: Secondary | ICD-10-CM

## 2021-09-15 DIAGNOSIS — Z79899 Other long term (current) drug therapy: Secondary | ICD-10-CM

## 2021-09-15 MED ORDER — AMIODARONE HCL 200 MG PO TABS: 100.0000 mg | ORAL_TABLET | Freq: Every day | ORAL | 0 refills | Status: DC

## 2021-09-15 NOTE — Progress Notes (Signed)
? ?ID:  Stacey Burns, DOB 10/22/1955, MRN [id number] ? ?PCP:  Andria Frames, PA-C  ?Cardiologist:  Rex Kras, DO, [facility] (established care September 15, 2021) ?Former Cardiology Providers: Horton Finer, MD   ? ?REASON FOR CONSULT: Paroxysmal atrial fibrillation and Bradycardia  ? ?REQUESTING PHYSICIAN:  ?Andria Frames, PA-C ?[address] ?[place],  [place] [address] ? ?Chief Complaint  ?Patient presents with  ? Atrial Fibrillation  ? New Patient (Initial Visit)  ? ? ?HPI  ?Stacey Burns is a 66 y.o. African-American female whose past medical history and cardiovascular risk factors include: Paroxysmal atrial fibrillation, history of direct-current cardioversion, insulin-dependent diabetes mellitus, hyperlipidemia, hypertension, aortic atherosclerosis, CAD, sleep apnea on CPAP, history of stroke (per EMR / patient unaware), lupus, former smoker, Hx of COVID infection, post menopausal, obesity due to excess calories.  ? ?She is referred to the office at the request of Andria Frames, PA-C for evaluation of paroxysmal atrial fibrillation. ? ?Atrial fibrillation: ?Per patient diagnosed around 2015.  Has undergone cardioversion x2 which she describes as "they shutdown my heart twice and did a reboot."  Per EMR underwent ablation in 2018 while in [place] (see care Everywhere OV 12/23/2018).  Has been on amiodarone since 2015 according to the patient and she is unaware if anybody is monitoring for amiodarone toxicity/side effects.  She is currently on Eliquis for thromboembolic prophylaxis.  She does not endorse evidence of bleeding or history of intracranial / gastrointestinal bleeding. ? ?Patient was referred to the practice for atrial fibrillation management; however, patient is more concerned with regards to her slow heart rate.  Patient complains of feeling more tired, fatigue, drowsy throughout the day.  Patient states that has been going on for the last year but  progressively getting worse.  She denies any near-syncope or syncopal event.  But reports that she is 4? since January 2023.  She is unable to provide additional history or comment on if they were mechanical falls or some other etiology.  She currently walks with a cane. ? ?FUNCTIONAL STATUS: ?Walks with a  cane. No structured exercise program or daily routine.  ? ?ALLERGIES: ?Allergies  ?Allergen Reactions  ? Penicillins Hives and Swelling  ?  Did it involve swelling of the face/tongue/throat, SOB, or low BP? Yes ?Did it involve sudden or severe rash/hives, skin peeling, or any reaction on the inside of your mouth or nose? No ?Did you need to seek medical attention at a hospital or doctor's office? No ?When did it last happen? <less than 10 years      ?If all above answers are ?NO?, may proceed with cephalosporin use. ?  ? ? ?MEDICATION LIST PRIOR TO VISIT: ?Current Meds  ?Medication Sig  ? acetaminophen (TYLENOL) 325 MG tablet Take 650 mg by mouth every 6 (six) hours as needed for pain or moderate pain.  ? albuterol (VENTOLIN HFA) 108 (90 Base) MCG/ACT inhaler Inhale 2 puffs into the lungs every 6 (six) hours as needed for wheezing or shortness of breath.  ? amiodarone (PACERONE) 200 MG tablet Take 0.5 tablets (100 mg total) by mouth daily.  ? apixaban (ELIQUIS) 5 MG TABS tablet Take 1 tablet (5 mg total) by mouth 2 (two) times daily.  ? atorvastatin (LIPITOR) 80 MG tablet Take 80 mg by mouth daily.  ? azaTHIOprine (IMURAN) 50 MG tablet Take 175 mg by mouth daily at 12 noon. Three tablets and a half once a day  ? baclofen (LIORESAL) 10 MG tablet  Take 1 tablet (10 mg total) by mouth at bedtime. (Patient taking differently: Take 10 mg by mouth as needed.)  ? Dulaglutide (TRULICITY) A999333 0000000 SOPN Inject 0.75 mg into the skin every 14 (fourteen) days.  ? Fluticasone-Umeclidin-Vilant (TRELEGY ELLIPTA) 200-62.5-25 MCG/ACT AEPB Inhale 1 puff into the lungs daily.  ? furosemide (LASIX) 40 MG tablet Take 1 tablet (40  mg total) by mouth daily.  ? glucose blood test strip Use as instructed  ? insulin NPH Human (HUMULIN N,NOVOLIN N) 100 UNIT/ML injection Inject into skin, sliding scale.  ?<150 0 U  ?150-199: 2U  ?200-249: 4 U ?250-299: 6 U  ?300-349: 8 U  ?350-400: 10 U  ?> 400 call doc  ? insulin NPH-regular Human (70-30) 100 UNIT/ML injection 28 units with breakfast, 15 units with dinner (Patient taking differently: 10-23 Units See admin instructions. 23 units with breakfast, 10 units with dinner)  ? Insulin Syringe-Needle U-100 30G X 5/16" 0.3 ML MISC 2 each by Does not apply route 2 (two) times daily.  ? lisinopril (PRINIVIL,ZESTRIL) 40 MG tablet Take 1 tablet (40 mg total) by mouth daily.  ? omeprazole (PRILOSEC) 20 MG capsule Take 1 capsule (20 mg total) by mouth daily.  ? ondansetron (ZOFRAN-ODT) 4 MG disintegrating tablet Take 4 mg by mouth every 8 (eight) hours as needed for vomiting or nausea.  ? [DISCONTINUED] amiodarone (PACERONE) 200 MG tablet Take 1 tablet (200 mg total) by mouth daily.  ? [DISCONTINUED] amLODipine (NORVASC) 10 MG tablet Take 5 mg by mouth daily.  ?  ? ?PAST MEDICAL HISTORY: ?Past Medical History:  ?Diagnosis Date  ? Asthma   ? Coronary artery disease   ? CPAP (continuous positive airway pressure) dependence   ? Diabetes mellitus without complication ([place])   ? Hypertension   ? Lupus ([place])   ? Paroxysmal atrial fibrillation ([facility])   ? Stroke [facility])   ? ? ?PAST SURGICAL HISTORY: ?Past Surgical History:  ?Procedure Laterality Date  ? ATRIAL FIBRILLATION ABLATION    ? CHOLECYSTECTOMY    ? ? ?FAMILY HISTORY: ?The patient family history includes Breast cancer in her sister and sister; Stroke in her mother. ? ?SOCIAL HISTORY:  ?The patient  reports that she has never smoked. She has never used smokeless tobacco. She reports that she does not drink alcohol and does not use drugs. ? ?REVIEW OF SYSTEMS: ?Review of Systems  ?Constitutional: Positive for malaise/fatigue.  ?Cardiovascular:  Negative for chest pain,  dyspnea on exertion, leg swelling, near-syncope, orthopnea, palpitations, paroxysmal nocturnal dyspnea and syncope.  ?Respiratory:  Positive for shortness of breath (chronic and stable).   ? ?PHYSICAL EXAM: ? ?  09/15/2021  ?  9:22 AM 07/23/2021  ?  8:30 PM 07/23/2021  ?  7:30 PM  ?Vitals with BMI  ?Height 5\' 4"     ?Weight 248 lbs    ?BMI 42.55    ?Systolic 123456 [id number]  ?Diastolic 51 66 67  ?Pulse 42 41 46  ? ? ?CONSTITUTIONAL: Appears older than stated age, hemodynamically stable, no acute distress, ambulates with a cane  ?SKIN: Skin is warm and dry. No cyanosis. No pallor. No jaundice ?HEAD: Normocephalic and atraumatic.  ?EYES: No scleral icterus ?MOUTH/THROAT: Moist oral membranes.  ?NECK: No JVD present. No thyromegaly noted. No carotid bruits  ?LYMPHATIC: No visible cervical adenopathy.  ?CHEST Normal respiratory effort. No intercostal retractions  ?LUNGS: Clear to auscultation bilaterally.  No stridor. No wheezes. No rales.  ?CARDIOVASCULAR: Bradycardia, positive S1-S2, no murmurs rubs or gallops appreciated. ?  ABDOMINAL: Obese, soft, nontender, nondistended, positive bowel sounds in all 4 quadrants, no apparent ascites.  ?EXTREMITIES: No peripheral edema, warm to touch, 1+ bilateral DP and PT pulses, discoid rash present bilaterally. ?HEMATOLOGIC: No significant bruising ?NEUROLOGIC: Oriented to person, place, and time. Nonfocal. Normal muscle tone.  ?PSYCHIATRIC: Normal mood and affect. Normal behavior. Cooperative ? ?RADIOLOGY: ?CT chest without contrast: ?Available in Care Everywhere. ?Examination date 08/31/2021 ?1. Pulmonary parenchymal pattern of fibrosis, as described above, minimally improved from 03/12/2019 and compatible with the sequelae of COVID-19 pneumonia. Findings are suggestive of an alternative diagnosis (not UIP) per consensus guidelines: Diagnosis of Idiopathic Pulmonary Fibrosis: An Official ATS/ERS/JRS/ALAT Clinical Practice Guideline. [place], Iss 5, [id number], Feb 13 2017. ?2. New upper lobe pulmonary nodules. Non-contrast chest CT at 3-6 months is recommended. If the nodules are stable at time of repeat CT, then future CT at 18-24 months (from today's scan) is considered

## 2021-09-18 ENCOUNTER — Ambulatory Visit: Payer: Self-pay | Admitting: Cardiology

## 2021-09-18 ENCOUNTER — Inpatient Hospital Stay: Payer: Medicare Other

## 2021-09-18 DIAGNOSIS — N1832 Chronic kidney disease, stage 3b: Secondary | ICD-10-CM

## 2021-09-18 DIAGNOSIS — R001 Bradycardia, unspecified: Secondary | ICD-10-CM

## 2021-10-08 ENCOUNTER — Inpatient Hospital Stay (HOSPITAL_COMMUNITY)
Admission: AD | Admit: 2021-10-08 | Discharge: 2021-10-14 | DRG: 243 | Disposition: A | Discharge status: Home Health | Payer: Medicare Other | Source: Ambulatory Visit | Attending: Cardiology | Admitting: Cardiology

## 2021-10-08 DIAGNOSIS — I480 Paroxysmal atrial fibrillation: Secondary | ICD-10-CM | POA: Diagnosis not present

## 2021-10-08 DIAGNOSIS — Z803 Family history of malignant neoplasm of breast: Secondary | ICD-10-CM | POA: Diagnosis not present

## 2021-10-08 DIAGNOSIS — Z7901 Long term (current) use of anticoagulants: Secondary | ICD-10-CM | POA: Diagnosis not present

## 2021-10-08 DIAGNOSIS — I700 Atherosclerosis of aorta: Secondary | ICD-10-CM | POA: Diagnosis not present

## 2021-10-08 DIAGNOSIS — I455 Other specified heart block: Secondary | ICD-10-CM

## 2021-10-08 DIAGNOSIS — Z87891 Personal history of nicotine dependence: Secondary | ICD-10-CM | POA: Diagnosis not present

## 2021-10-08 DIAGNOSIS — J449 Chronic obstructive pulmonary disease, unspecified: Secondary | ICD-10-CM | POA: Diagnosis present

## 2021-10-08 DIAGNOSIS — E119 Type 2 diabetes mellitus without complications: Secondary | ICD-10-CM | POA: Diagnosis present

## 2021-10-08 DIAGNOSIS — I10 Essential (primary) hypertension: Secondary | ICD-10-CM | POA: Diagnosis present

## 2021-10-08 DIAGNOSIS — Z794 Long term (current) use of insulin: Secondary | ICD-10-CM

## 2021-10-08 DIAGNOSIS — Z6841 Body Mass Index (BMI) 40.0 and over, adult: Secondary | ICD-10-CM | POA: Diagnosis not present

## 2021-10-08 DIAGNOSIS — D689 Coagulation defect, unspecified: Secondary | ICD-10-CM | POA: Diagnosis present

## 2021-10-08 DIAGNOSIS — Z823 Family history of stroke: Secondary | ICD-10-CM | POA: Diagnosis not present

## 2021-10-08 DIAGNOSIS — E669 Obesity, unspecified: Secondary | ICD-10-CM | POA: Diagnosis present

## 2021-10-08 DIAGNOSIS — E785 Hyperlipidemia, unspecified: Secondary | ICD-10-CM | POA: Diagnosis present

## 2021-10-08 DIAGNOSIS — Z79899 Other long term (current) drug therapy: Secondary | ICD-10-CM

## 2021-10-08 DIAGNOSIS — I2721 Secondary pulmonary arterial hypertension: Secondary | ICD-10-CM | POA: Diagnosis not present

## 2021-10-08 DIAGNOSIS — Z7985 Long-term (current) use of injectable non-insulin antidiabetic drugs: Secondary | ICD-10-CM | POA: Diagnosis not present

## 2021-10-08 DIAGNOSIS — Z8673 Personal history of transient ischemic attack (TIA), and cerebral infarction without residual deficits: Secondary | ICD-10-CM | POA: Diagnosis not present

## 2021-10-08 DIAGNOSIS — R001 Bradycardia, unspecified: Secondary | ICD-10-CM | POA: Diagnosis not present

## 2021-10-08 DIAGNOSIS — Z8616 Personal history of COVID-19: Secondary | ICD-10-CM | POA: Diagnosis not present

## 2021-10-08 DIAGNOSIS — I495 Sick sinus syndrome: Secondary | ICD-10-CM | POA: Diagnosis not present

## 2021-10-08 DIAGNOSIS — I2510 Atherosclerotic heart disease of native coronary artery without angina pectoris: Secondary | ICD-10-CM | POA: Diagnosis present

## 2021-10-08 DIAGNOSIS — M3214 Glomerular disease in systemic lupus erythematosus: Secondary | ICD-10-CM | POA: Diagnosis not present

## 2021-10-08 DIAGNOSIS — G4733 Obstructive sleep apnea (adult) (pediatric): Secondary | ICD-10-CM | POA: Diagnosis present

## 2021-10-08 DIAGNOSIS — Z880 Allergy status to penicillin: Secondary | ICD-10-CM | POA: Diagnosis not present

## 2021-10-08 DIAGNOSIS — M329 Systemic lupus erythematosus, unspecified: Secondary | ICD-10-CM | POA: Diagnosis present

## 2021-10-08 DIAGNOSIS — Z7951 Long term (current) use of inhaled steroids: Secondary | ICD-10-CM

## 2021-10-08 LAB — COMPREHENSIVE METABOLIC PANEL
ALT: 58 U/L — ABNORMAL HIGH (ref 0–44)
AST: 96 U/L — ABNORMAL HIGH (ref 15–41)
Albumin: 2.6 g/dL — ABNORMAL LOW (ref 3.5–5.0)
Alkaline Phosphatase: 126 U/L (ref 38–126)
Anion gap: 4 — ABNORMAL LOW (ref 5–15)
BUN: 14 mg/dL (ref 8–23)
CO2: 25 mmol/L (ref 22–32)
Calcium: 8.7 mg/dL — ABNORMAL LOW (ref 8.9–10.3)
Chloride: 110 mmol/L (ref 98–111)
Creatinine, Ser: 1.07 mg/dL — ABNORMAL HIGH (ref 0.44–1.00)
GFR, Estimated: 58 mL/min — ABNORMAL LOW (ref >60)
Glucose, Bld: 93 mg/dL (ref 70–99)
Potassium: 4.5 mmol/L (ref 3.5–5.1)
Sodium: 139 mmol/L (ref 135–145)
Total Bilirubin: 1.1 mg/dL (ref 0.3–1.2)
Total Protein: 7.4 g/dL (ref 6.5–8.1)

## 2021-10-08 LAB — CBC
HCT: 38.9 % (ref 36.0–46.0)
Hemoglobin: 13.3 g/dL (ref 12.0–15.0)
MCH: 35.1 pg — ABNORMAL HIGH (ref 26.0–34.0)
MCHC: 34.2 g/dL (ref 30.0–36.0)
MCV: 102.6 fL — ABNORMAL HIGH (ref 80.0–100.0)
Platelets: 113 10*3/uL — ABNORMAL LOW (ref 150–400)
RBC: 3.79 MIL/uL — ABNORMAL LOW (ref 3.87–5.11)
RDW: 19.3 % — ABNORMAL HIGH (ref 11.5–15.5)
WBC: 4.2 10*3/uL (ref 4.0–10.5)
nRBC: 0 % (ref 0.0–0.2)

## 2021-10-08 LAB — TSH: TSH: 1.216 u[IU]/mL (ref 0.350–4.500)

## 2021-10-08 LAB — HEMOGLOBIN A1C
Hgb A1c MFr Bld: 6.6 % — ABNORMAL HIGH (ref 4.8–5.6)
Mean Plasma Glucose: 142.72 mg/dL

## 2021-10-08 LAB — GLUCOSE, CAPILLARY
Glucose-Capillary: 83 mg/dL (ref 70–99)
Glucose-Capillary: 125 mg/dL — ABNORMAL HIGH (ref 70–99)

## 2021-10-08 LAB — BRAIN NATRIURETIC PEPTIDE: B Natriuretic Peptide: 211 pg/mL — ABNORMAL HIGH (ref 0.0–100.0)

## 2021-10-08 LAB — MAGNESIUM: Magnesium: 2.2 mg/dL (ref 1.7–2.4)

## 2021-10-08 LAB — HIV ANTIBODY (ROUTINE TESTING W REFLEX): HIV Screen 4th Generation wRfx: NONREACTIVE

## 2021-10-08 MED ORDER — INSULIN ASPART 100 UNIT/ML IJ SOLN
0.0000 [IU] | Freq: Three times a day (TID) | INTRAMUSCULAR | Status: DC
  Administered 2021-10-09 – 2021-10-10 (×2): 4 [IU] via SUBCUTANEOUS
  Administered 2021-10-10: 3 [IU] via SUBCUTANEOUS
  Administered 2021-10-11 – 2021-10-12 (×3): 4 [IU] via SUBCUTANEOUS

## 2021-10-08 MED ORDER — ONDANSETRON 4 MG PO TBDP
4.0000 mg | ORAL_TABLET | Freq: Three times a day (TID) | ORAL | Status: DC | PRN
  Administered 2021-10-11: 4 mg via ORAL
  Filled 2021-10-08: qty 1

## 2021-10-08 MED ORDER — LISINOPRIL 20 MG PO TABS: 40.0000 mg | ORAL_TABLET | Freq: Every day | ORAL | Status: DC

## 2021-10-08 MED ORDER — INSULIN ASPART 100 UNIT/ML IJ SOLN: 0.0000 [IU] | Freq: Three times a day (TID) | INTRAMUSCULAR | Status: DC

## 2021-10-08 MED ORDER — ATORVASTATIN CALCIUM 80 MG PO TABS
80.0000 mg | ORAL_TABLET | Freq: Every day | ORAL | Status: DC
Start: 2021-10-09 — End: 2021-10-14
  Administered 2021-10-09 – 2021-10-14 (×6): 80 mg via ORAL
  Filled 2021-10-08 (×6): qty 1

## 2021-10-08 MED ORDER — ONDANSETRON HCL 4 MG/2ML IJ SOLN
4.0000 mg | Freq: Three times a day (TID) | INTRAMUSCULAR | Status: DC | PRN
  Administered 2021-10-08: 4 mg via INTRAVENOUS

## 2021-10-08 MED ORDER — FLUTICASONE FUROATE-VILANTEROL 200-25 MCG/ACT IN AEPB
1.0000 | INHALATION_SPRAY | Freq: Every day | RESPIRATORY_TRACT | Status: DC
  Administered 2021-10-09 – 2021-10-12 (×4): 1 via RESPIRATORY_TRACT
  Filled 2021-10-08: qty 28

## 2021-10-08 MED ORDER — HEPARIN SODIUM (PORCINE) 5000 UNIT/ML IJ SOLN
5000.0000 [IU] | Freq: Three times a day (TID) | INTRAMUSCULAR | Status: DC
  Administered 2021-10-08 – 2021-10-09 (×2): 5000 [IU] via SUBCUTANEOUS
  Filled 2021-10-08 (×2): qty 1

## 2021-10-08 MED ORDER — AMIODARONE HCL 200 MG PO TABS: 100.0000 mg | ORAL_TABLET | Freq: Every day | ORAL | Status: DC

## 2021-10-08 MED ORDER — ALBUTEROL SULFATE (2.5 MG/3ML) 0.083% IN NEBU: 2.5000 mg | INHALATION_SOLUTION | Freq: Four times a day (QID) | RESPIRATORY_TRACT | Status: DC | PRN

## 2021-10-08 MED ORDER — AZATHIOPRINE 50 MG PO TABS
175.0000 mg | ORAL_TABLET | Freq: Every day | ORAL | Status: DC
  Administered 2021-10-09 – 2021-10-12 (×4): 175 mg via ORAL
  Filled 2021-10-08 (×5): qty 4

## 2021-10-08 MED ORDER — LISINOPRIL 20 MG PO TABS
40.0000 mg | ORAL_TABLET | Freq: Every day | ORAL | Status: DC
  Administered 2021-10-09 – 2021-10-14 (×7): 40 mg via ORAL
  Filled 2021-10-08 (×7): qty 2

## 2021-10-08 MED ORDER — ONDANSETRON HCL 4 MG/2ML IJ SOLN: 4.0000 mg | Freq: Once | INTRAMUSCULAR | Status: DC

## 2021-10-08 MED ORDER — HYDRALAZINE HCL 20 MG/ML IJ SOLN
10.0000 mg | Freq: Three times a day (TID) | INTRAMUSCULAR | Status: DC | PRN
  Administered 2021-10-08: 10 mg via INTRAVENOUS
  Filled 2021-10-08: qty 1

## 2021-10-08 MED ORDER — UMECLIDINIUM BROMIDE 62.5 MCG/ACT IN AEPB
1.0000 | INHALATION_SPRAY | Freq: Every day | RESPIRATORY_TRACT | Status: DC
  Administered 2021-10-09 – 2021-10-12 (×4): 1 via RESPIRATORY_TRACT
  Filled 2021-10-08: qty 7

## 2021-10-08 NOTE — H&P (Signed)
HISTORY AND PHYSICAL  ?Patient ID: ?Dunmore ?MRN: [id number] ?DOB/AGE: [birth year] 66 y.o. ? ?Admit date: 10/08/2021 ?Attending physician: Rex Kras, DO, [facility] ?Primary Physician:  Andria Frames, PA-C ? ?Chief complaint: low heart rate and abnormal monitor ? ?HPI:  ?Stacey Burns is a 66 y.o. female who presents with a chief complaint of "low heart rate and abnormal monitor." Her past medical history and cardiovascular risk factors include: Paroxysmal atrial fibrillation, history of direct-current cardioversion, insulin-dependent diabetes mellitus type 2, hyperlipidemia, hypertension, aortic atherosclerosis, history of CAD (per patient), sleep apnea not on CPAP, history of stroke (per EMR), lupus, former smoker, history of COVID-19 infection, postmenopausal woman, obesity due to excess calories. ? ?She establish care with the practice in April 2023 for management of atrial fibrillation and bradycardia.  She has had atrial fibrillation since 2015 and has been cardioverted twice and has been on amiodarone for rhythm control and oral anticoagulation for thromboembolic prophylaxis.  She was on rate control strategy but was discontinued secondary to underlying bradycardia. ? ?Patient states that she has been very concerned with a slow heart rate in addition to feeling tired, fatigued, drowsy throughout the day, short of breath with activity and the symptoms have been progressive over the last several years.  She denies near-syncope or syncopal event.  But has fallen 4 times since January 2023 to the best of her recollection no loss of consciousness. ? ?At the last office visit the shared decision was to proceed with an extended Holter monitor to evaluate for dysrhythmias, check TSH, and ischemic work-up. ? ?Her extended Holter monitor results came back today which notes an average heart rate of 43 bpm, underlying rhythm sinus, 488 episodes of pauses 3 seconds or longer (longest being 4.4 seconds  on/12/2021 at 9:17 AM.  Given these findings and her clinical presentation she was advised to be evaluated by EP.  No outpatient appointments are available and patient presents to the hospital for further evaluation and management. ? ?ALLERGIES: ?Allergies  ?Allergen Reactions  ? Penicillins Hives and Swelling  ?  Did it involve swelling of the face/tongue/throat, SOB, or low BP? Yes ?Did it involve sudden or severe rash/hives, skin peeling, or any reaction on the inside of your mouth or nose? No ?Did you need to seek medical attention at a hospital or doctor's office? No ?When did it last happen? <less than 10 years      ?If all above answers are ?NO?, may proceed with cephalosporin use. ?  ? ? ?PAST MEDICAL HISTORY: ?Past Medical History:  ?Diagnosis Date  ? Asthma   ? Coronary artery disease   ? CPAP (continuous positive airway pressure) dependence   ? Diabetes mellitus without complication ([place])   ? Hypertension   ? Lupus ([place])   ? Paroxysmal atrial fibrillation (H[facility])   ? [facility]roke [facility]   ? ? ?PAST SURGICAL HISTORY: ?Past Surgical History:  ?Procedure Laterality Date  ? ATRIAL FIBRILLATION ABLATION    ? CHOLECYSTECTOMY    ? ? ?FAMILY HISTORY: ?The patient family history includes Breast cancer in her sister and sister; Stroke in her mother. ?  ?SOCIAL HISTORY:  ?The patient  reports that she has never smoked. She has never used smokeless tobacco. She reports that she does not drink alcohol and does not use drugs. ? ?MEDICATIONS: ?Current Outpatient Medications  ?Medication Instructions  ? acetaminophen (TYLENOL) 650 mg, Oral, Every 6 hours PRN  ? albuterol (VENTOLIN HFA) 108 (90 Base) MCG/ACT inhaler 2 puffs, Inhalation, Every  6 hours PRN  ? amiodarone (PACERONE) 100 mg, Oral, Daily  ? apixaban (ELIQUIS) 5 mg, Oral, 2 times daily  ? atorvastatin (LIPITOR) 80 mg, Oral, Daily  ? azaTHIOprine (IMURAN) 175 mg, Oral, Daily, Three tablets and a half once a day  ? baclofen (LIORESAL) 10 mg, Oral, Daily at bedtime  ?  Fluticasone-Umeclidin-Vilant (TRELEGY ELLIPTA) 200-62.5-25 MCG/ACT AEPB 1 puff, Inhalation, Daily  ? furosemide (LASIX) 40 mg, Oral, Daily  ? glucose blood test strip Use as instructed  ? insulin NPH-regular Human (70-30) 100 UNIT/ML injection 28 units with breakfast, 15 units with dinner  ? Insulin Syringe-Needle U-100 30G X 5/16" 0.3 ML MISC 2 each, Does not apply, 2 times daily  ? lisinopril (ZESTRIL) 40 mg, Oral, Daily  ? omeprazole (PRILOSEC) 20 mg, Oral, Daily  ? ondansetron (ZOFRAN-ODT) 4 mg, Oral, Every 8 hours PRN  ? Trulicity [id number] mg, Subcutaneous, Every 14 days  ? ? ? ? ?REVIEW OF SYSTEMS: ?Review of Systems  ?Constitutional: Positive for malaise/fatigue. Negative for chills and fever.  ?HENT:  Negative for hoarse voice and nosebleeds.   ?Eyes:  Negative for discharge, double vision and pain.  ?Cardiovascular:  Negative for chest pain, claudication, dyspnea on exertion, leg swelling, near-syncope, orthopnea, palpitations, paroxysmal nocturnal dyspnea and syncope.  ?     Last fall (6 weeks ago) and 4 falls since Jan 2023.  ?Respiratory:  Positive for shortness of breath. Negative for hemoptysis.   ?Hematologic/Lymphatic: Negative for bleeding problem.  ?Musculoskeletal:  Negative for muscle cramps and myalgias.  ?Gastrointestinal:  Negative for abdominal pain, constipation, diarrhea, hematemesis, hematochezia, melena, nausea and vomiting.  ?Neurological:  Positive for dizziness and light-headedness.  ?All other systems reviewed and are negative. ? ?PHYSICAL EXAM: ? ?  10/08/2021  ?  4:43 PM 09/15/2021  ?  9:22 AM 07/23/2021  ?  8:30 PM  ?Vitals with BMI  ?Height 5\' 4"  5\' 4"    ?Weight 250 lbs 7 oz 248 lbs   ?BMI 42.97 42.55   ?Systolic  123456 0000000  ?Diastolic  51 66  ?Pulse 48 42 41  ? ? ?No intake or output data in the 24 hours ending 10/08/21 1650  ?Net IO Since Admission: No IO data has been entered for this period [10/08/21 1650] ?CONSTITUTIONAL: Appears older than stated age, hemodynamically stable, ambulates  with a cane, accompanied by family member at bedside. ?SKIN: Skin is warm and dry.  Discoid rash noted. No cyanosis. No pallor. No jaundice ?HEAD: Normocephalic and atraumatic.  ?EYES: No scleral icterus ?MOUTH/THROAT: Moist oral membranes.  ?NECK: No JVD present. No thyromegaly noted. No carotid bruits  ?LYMPHATIC: No visible cervical adenopathy.  ?CHEST Normal respiratory effort. No intercostal retractions  ?LUNGS: Clear to auscultation bilaterally.  No stridor. No wheezes. No rales.  ?CARDIOVASCULAR: Bradycardic, positive S1-S2, no murmurs rubs or gallops appreciated. ?ABDOMINAL: Obese, soft, nontender, nondistended, positive bowel sounds in all 4 quadrants, no apparent ascites.  ?EXTREMITIES: No peripheral edema, warm to touch, tender, 1+ bilateral DP and PT pulses, discoid rash present. ?HEMATOLOGIC: No significant bruising ?NEUROLOGIC: Oriented to person, place, and time. Nonfocal. Normal muscle tone.  ?PSYCHIATRIC: Normal mood and affect. Normal behavior. Cooperative ? ?RADIOLOGY: ?No results found. ? ?LABORATORY DATA: ?Lab Results  ?Component Value Date  ? WBC 5.8 07/23/2021  ? HGB 13.4 07/23/2021  ? HCT 41.5 07/23/2021  ? MCV 101.2 (H) 07/23/2021  ? PLT 160 07/23/2021  ? No results for input(s): NA, K, CL, CO2, BUN, CREATININE, CALCIUM, PROT, BILITOT, ALKPHOS, ALT,  AST, GLUCOSE in the last 168 hours. ? ?Invalid input(s): LABALBU ? ?Lipid Panel  ?Lab Results  ?Component Value Date  ? TRIG 70 01/17/2019  ? ? ?BNP (last 3 results) ?No results for input(s): BNP in the last 8760 hours. ? ?HEMOGLOBIN A1C ?Lab Results  ?Component Value Date  ? HGBA1C 7.1 (H) 01/15/2019  ? MPG 157.07 01/15/2019  ? ? ?Cardiac Panel (last 3 results) ?No results for input(s): CKTOTAL, CKMB, RELINDX in the last 8760 hours. ? ?Invalid input(s): TROPONINHS ? ?Lab Results  ?Component Value Date  ? CKTOTAL 256 (H) 01/17/2019  ?  ? ?TSH ?No results for input(s): TSH in the last 8760 hours.  ? ? ?CARDIAC DATABASE: ?EKG: ?September 15, 2021: Sinus  bradycardia, 42 bpm, nonspecific T wave abnormality, without underlying injury pattern. ?October 08, 2021: Pending ? ?Echocardiogram: ?08/28/2021 at [facility]: ?Per report

## 2021-10-08 NOTE — Progress Notes (Addendum)
Patient arrived as direct admit to 4E20  per Cardiology s/t symptomatic bradycardia with pauses detected on home monitor.  Telemetry monitor applied and CCMD notified.  CHG bath and skin assessment completed.  Transcutaneous pacer pads placed on patient and code cart outside room per MD.  Patient oriented to unit and room to include call light and phone.  Will continue to monitor. ?

## 2021-10-08 NOTE — Progress Notes (Signed)
Dr. Odis Hollingshead text-paged @2049  regarding the following: ? ?-pt's endorsement of nausea and request for antiemetic ?-pt's BP 174/63 despite administration of PRN IV Hydralazine. ?

## 2021-10-09 DIAGNOSIS — R001 Bradycardia, unspecified: Secondary | ICD-10-CM

## 2021-10-09 LAB — GLUCOSE, CAPILLARY
Glucose-Capillary: 144 mg/dL — ABNORMAL HIGH (ref 70–99)
Glucose-Capillary: 165 mg/dL — ABNORMAL HIGH (ref 70–99)
Glucose-Capillary: 98 mg/dL (ref 70–99)
Glucose-Capillary: 163 mg/dL — ABNORMAL HIGH (ref 70–99)

## 2021-10-09 LAB — LIPID PANEL
Cholesterol: 66 mg/dL (ref 0–200)
HDL: 30 mg/dL — ABNORMAL LOW (ref >40)
LDL Cholesterol: 34 mg/dL (ref 0–99)
Total CHOL/HDL Ratio: 2.2 RATIO
Triglycerides: 12 mg/dL (ref <150)
VLDL: 2 mg/dL (ref 0–40)

## 2021-10-09 LAB — LDL CHOLESTEROL, DIRECT: Direct LDL: 24.3 mg/dL (ref 0–99)

## 2021-10-09 MED ORDER — SODIUM CHLORIDE 0.9% FLUSH
3.0000 mL | Freq: Two times a day (BID) | INTRAVENOUS | Status: DC
  Administered 2021-10-09 – 2021-10-12 (×7): 3 mL via INTRAVENOUS

## 2021-10-09 NOTE — Consult Note (Signed)
?Cardiology Consultation:  ? ?Patient ID: Stacey Burns ?MRN: [id number]; DOB: [birth year] ? ?Admit date: 10/08/2021 ?Date of Consult: 10/09/2021 ? ?PCP:  Andria Frames, PA-C ?  ?[facility] Providers ?Cardiologist:  Dr. Mortimer Fries ([facility]) >> Dr. Terri Skains ? ? ?Patient Profile:  ? ?Stacey Burns is a 66 y.o. female with a hx of HTN, HLD, DM, Obesity, sleeps apnea (w/CPAP), AFib, ? Mention of stroke hx,  LUPUS nephritis, COPD, who is being seen 10/09/2021 for the evaluation of bradycardia at the request of Dr. Terri Skains. ? ?Afib/AAD Hx ?Diagnosed 2015 ?Uncertain a presumed AFib ablation in [place] ?Amiodarone reportedly started started 2015, I find it going back to at least 2020 ? ? ?History of Present Illness:  ? ?She saw Dr. Nash Shearer Jan 2023 for follow up and seems pre-op risk assessment, planned for updated echo, cardia PET stress with SOB/DOE, and a monitor ?LVEF by TTE 60-65% w/no VHD ?I dont see the PET stress done yet ? ?Referred to Dr. Terri Skains out patient this month for 2nd opinion on her AFib management and bradycardia, pt reports progressive fatigue, weakness, poor exertional capacity with SOB, this over years time frame, reporting a few falls, no syncope.  He planned for stress test and monitoring.  Gibe her recent CT chest with fibrosis and her bradycardia, amiodarone dose reduced to 100mg  daily ? ?Monitor  ?Patch Wear Time:  12 days and 11 hours  ?Dominant rhythm sinus rhythm, followed by sinus bradycardia (94% burden) with intermittent junctional escape rhythm. ?Heart rate 25-116 bpm.  Avg HR 43bpm. ?No atrial fibrillation, ventricular tachycardia, high grade AV block. ?Total ventricular ectopic burden <1%. ?Total supraventricular ectopic burden 2.1%. ?488 pauses noted which were 3 seconds or longer. ?Longest pause:09/19/2021 at 9:17 AM 4.4 seconds. ?Second longest pause: 09/24/2021 at 6:12 AM 4.3 seconds. ?Third longest pause: 09/24/2021 at 1:44 PM 4.2 seconds. ?Patient  triggered events: 0. ? ?The patient was referred to EP in the out patient setting though with no early available appointments  she came to the hospital for further management. ? ?LABS ?K+ 4.5 ?BUN/Creat 14/1.07 ?Mag 2.2 ?Albumin 2.6 ?AST 96 ?ALT 58 ?WBC 4.2 ?H/H 13/38 ?Plts 113 ?TSH 1/216 ? ?Home meds ?Chronic amiodarone ?No other nodal blocking agents ? ?She is on Imuran, immunosuppressed ? ?A/c with Xarelto, recently switched for financial reasons, though unclear, started on DVT treatment dose  ?Our RPH is working on clarifying this ? ? ?The patient reports at least a year of marked fatigue, poor exertional capacity and a few falls that she has no clear explanation for, though no clear syncope ? ?Past Medical History:  ?Diagnosis Date  ? Asthma   ? Coronary artery disease   ? CPAP (continuous positive airway pressure) dependence   ? Diabetes mellitus without complication ([place])   ? Hypertension   ? Lupus ([place])   ? Paroxysmal atrial fibrillation (HCC)   ? Stroke [facility])   ? ? ?Past Surgical History:  ?Procedure Laterality Date  ? ATRIAL FIBRILLATION ABLATION    ? CHOLECYSTECTOMY    ?  ? ?Home Medications:  ?Prior to Admission medications   ?Medication Sig Start Date End Date Taking? Authorizing Provider  ?acetaminophen (TYLENOL) 325 MG tablet Take 650 mg by mouth every 6 (six) hours as needed for pain or moderate pain. 01/11/19  Yes [provider]  ?albuterol (VENTOLIN HFA) 108 (90 Base) MCG/ACT inhaler Inhale 2 puffs into the lungs every 6 (six) hours as needed for wheezing or shortness of breath.  Yes [provider]  ?amiodarone (PACERONE) 200 MG tablet Take 0.5 tablets (100 mg total) by mouth daily. 09/15/21 10/15/21 Yes Tolia, Sunit, DO  ?atorvastatin (LIPITOR) 80 MG tablet Take 80 mg by mouth daily. 07/05/21  Yes [provider]  ?azaTHIOprine (IMURAN) 50 MG tablet Take 175 mg by mouth daily at 12 noon. Three tablets and a half once a day 01/11/19  Yes [provider]  ?baclofen  (LIORESAL) 10 MG tablet Take 1 tablet (10 mg total) by mouth at bedtime. ?Patient taking differently: Take 10 mg by mouth daily as needed for muscle spasms. 08/02/18  Yes Patriciaann Clan, DO  ?Dulaglutide (TRULICITY) A999333 0000000 SOPN Inject 0.75 mg into the skin every 14 (fourteen) days.   Yes [provider]  ?Fluticasone-Umeclidin-Vilant (TRELEGY ELLIPTA) 200-62.5-25 MCG/ACT AEPB Inhale 1 puff into the lungs daily as needed (sob). 09/09/21  Yes [provider]  ?HYDROcodone-acetaminophen (NORCO/VICODIN) 5-325 MG tablet Take 1 tablet by mouth every 6 (six) hours as needed for moderate pain or severe pain. 09/22/21  Yes [provider]  ?insulin NPH-regular Human (70-30) 100 UNIT/ML injection 28 units with breakfast, 15 units with dinner ?Patient taking differently: 20 Units every evening. 08/02/18  Yes Patriciaann Clan, DO  ?lisinopril (PRINIVIL,ZESTRIL) 40 MG tablet Take 1 tablet (40 mg total) by mouth daily. 08/02/18  Yes Darrelyn Hillock N, DO  ?ondansetron (ZOFRAN-ODT) 4 MG disintegrating tablet Take 4 mg by mouth every 8 (eight) hours as needed for vomiting or nausea. 06/26/21  Yes [provider]  ?Dollene Primrose PACK Take 15-20 mg by mouth as directed. TAKE 15 MG TWICE DAILY FOR 1ST 21 DAYS THEN 20 MG DAILY WITH DINNER FOR REMAINING TREATMENT 09/22/21  Yes [provider]  ?apixaban (ELIQUIS) 5 MG TABS tablet Take 1 tablet (5 mg total) by mouth 2 (two) times daily. ?Patient not taking: Reported on 10/08/2021 08/29/18   Patriciaann Clan, DO  ?furosemide (LASIX) 40 MG tablet Take 1 tablet (40 mg total) by mouth daily. ?Patient not taking: Reported on 10/08/2021 08/02/18   Patriciaann Clan, DO  ?glucose blood test strip Use as instructed 08/02/18   Patriciaann Clan, DO  ?Insulin Syringe-Needle U-100 30G X 5/16" 0.3 ML MISC 2 each by Does not apply route 2 (two) times daily. 08/02/18   Patriciaann Clan, DO  ?omeprazole (PRILOSEC) 20 MG capsule Take 1 capsule (20 mg total)  by mouth daily. ?Patient not taking: Reported on 10/08/2021 08/02/18   Patriciaann Clan, DO  ? ? ?Inpatient Medications: ?Scheduled Meds: ? amiodarone  100 mg Oral Daily  ? atorvastatin  80 mg Oral Daily  ? azaTHIOprine  175 mg Oral Q1200  ? fluticasone furoate-vilanterol  1 puff Inhalation Daily  ? And  ? umeclidinium bromide  1 puff Inhalation Daily  ? heparin  5,000 Units Subcutaneous Q8H  ? insulin aspart  0-20 Units Subcutaneous TID WC  ? lisinopril  40 mg Oral Daily  ? ?Continuous Infusions: ? ?PRN Meds: ?albuterol, hydrALAZINE, ondansetron (ZOFRAN) IV, ondansetron ? ?Allergies:    ?Allergies  ?Allergen Reactions  ? Penicillins Hives and Swelling  ?  Did it involve swelling of the face/tongue/throat, SOB, or low BP? Yes ?Did it involve sudden or severe rash/hives, skin peeling, or any reaction on the inside of your mouth or nose? No ?Did you need to seek medical attention at a hospital or doctor's office? No ?When did it last happen? <less than 10 years      ?  If all above answers are ?NO?, may proceed with cephalosporin use. ?  ? ? ?Social History:   ?Social History  ? ?Socioeconomic History  ? Marital status: Widowed  ?  Spouse name: Not on file  ? Number of children: Not on file  ? Years of education: Not on file  ? Highest education level: Not on file  ?Occupational History  ? Not on file  ?Tobacco Use  ? Smoking status: Never  ? Smokeless tobacco: Never  ?Vaping Use  ? Vaping Use: Never used  ?Substance and Sexual Activity  ? Alcohol use: Never  ? Drug use: Never  ? Sexual activity: Not on file  ?Other Topics Concern  ? Not on file  ?Social History Narrative  ? Lives with multiple family members who are not isolating  ? ?Social Determinants of Health  ? ?Financial Resource Strain: Not on file  ?Food Insecurity: Not on file  ?Transportation Needs: Not on file  ?Physical Activity: Not on file  ?Stress: Not on file  ?Social Connections: Not on file  ?Intimate Partner Violence: Not on file  ?  ?Family History:    ?Family History  ?Problem Relation Age of Onset  ? Stroke Mother   ?     blood clot in brain  ? Breast cancer Sister   ? Breast cancer Sister   ?  ? ?ROS:  ?Please see the history of present illness.  ?Al

## 2021-10-09 NOTE — Progress Notes (Signed)
Progress Note ? ?[name] ?Date of Encounter: 10/09/2021 ? ?Attending physician: Rex Kras, DO ?Primary care provider: Andria Frames, PA-C ? ?Subjective: ?Stacey Burns is a 66 y.o. female who was seen and examined at bedside  ?No chest pain, shortness of breath, syncope.  ?HR 40-50s and no pauses > 3sec since hospitalization yesterday.  ?Case discussed and reviewed with her nurse. ? ?Objective: ?Vital Signs in the last 24 hours: ?Temp:  [98.4 ?F (36.9 ?C)-98.8 ?F (37.1 ?C)] 98.6 ?F (37 ?C) (04/27 [id number]) ?Pulse Rate:  [46-73] 54 (04/27 0733) ?Resp:  [15-19] 18 (04/27 0733) ?BP: (138-179)/(56-105) 144/56 (04/27 [id number]) ?SpO2:  [95 %-100 %] 98 % (04/27 0852) ?Weight:  [112.7 kg-113.6 kg] 112.7 kg (04/27 0431) ? ?Intake/Output: ? ?Intake/Output Summary (Last 24 hours) at 10/09/2021 1224 ?Last data filed at 10/08/2021 1916 ?Gross per 24 hour  ?Intake 360 ml  ?Output --  ?Net 360 ml  ?  ?Net IO Since Admission: 360 mL [10/09/21 1224] ? ?Weights:  ?Filed Weights  ? 10/08/21 1643 10/09/21 0431  ?Weight: 113.6 kg 112.7 kg  ? ? ?Telemetry: Personally reviewed. SB w/ HR between 40-50s, rare PVCs, no pauses >3sec ? ?Physical examination: ?PHYSICAL EXAM: ? ?  10/09/2021  ?  7:33 AM 10/09/2021  ?  4:31 AM 10/09/2021  ? 12:05 AM  ?Vitals with BMI  ?Weight  248 lbs 8 oz   ?BMI  42.63   ?Systolic [id number]  ?Diastolic 56 123456 57  ?Pulse 54 46 73  ? ? ?General: Appears older than stated age, hemodynamically stable, ambulates with a cane, no acute distress. ?HEENT: Normocephalic, atraumatic, no scleral icterus or xanthelasmas, no JVP, no carotid bruits. ?Heart: Bradycardic, regular, positive S1-S2, no murmurs rubs or gallops appreciated. ?Chest: Clear to auscultation bilaterally, equal rise and fall of chest cavity, no wheezes rales or rhonchi's. ?Abdomen: Is obese, soft, nontender, nondistended, positive bowel sounds in all 4 quadrants. ?Extremities: No pitting edema, warm to touch, discoid rash  present. ?Neuro: Cranial nerves II to XII are grossly intact, nonfocal, normal muscle tone. ? ?Lab Results: ?Hematology ?Recent Labs  ?Lab 10/08/21 ?1816  ?WBC 4.2  ?RBC 3.79*  ?HGB 13.3  ?HCT 38.9  ?MCV 102.6*  ?MCH 35.1*  ?MCHC 34.2  ?RDW 19.3*  ?PLT 113*  ? ? ?Chemistry ?Recent Labs  ?Lab 10/08/21 ?1816  ?NA 139  ?K 4.5  ?CL 110  ?CO2 25  ?GLUCOSE 93  ?BUN 14  ?CREATININE 1.07*  ?CALCIUM 8.7*  ?PROT 7.4  ?ALBUMIN 2.6*  ?AST 96*  ?ALT 58*  ?ALKPHOS 126  ?BILITOT 1.1  ?GFRNONAA 58*  ?ANIONGAP 4*  ?  ? ?Cardiac Enzymes: ?Cardiac Panel (last 3 results) ?No results for input(s): CKTOTAL, CKMB, TROPONINIHS, RELINDX in the last 72 hours. ? ?BNP (last 3 results) ?Recent Labs  ?  10/08/21 ?1816  ?BNP 211.0*  ? ? ?ProBNP (last 3 results) ?No results for input(s): PROBNP in the last 8760 hours. ? ? ?DDimer No results for input(s): DDIMER in the last 168 hours.  ? ?Hemoglobin A1c:  ?Lab Results  ?Component Value Date  ? HGBA1C 6.6 (H) 10/08/2021  ? MPG 142.72 10/08/2021  ? ? ?TSH  ?Recent Labs  ?  10/08/21 ?1816  ?TSH 1.216  ? ? ?Lipid Panel  ?   ?Component Value Date/Time  ? CHOL 66 10/09/2021 0148  ? TRIG 12 10/09/2021 0148  ? HDL 30 (L) 10/09/2021 0148  ? CHOLHDL 2.2 10/09/2021 0148  ? VLDL 2  10/09/2021 0148  ? [place] 34 10/09/2021 0148  ? LDLDIRECT 24.3 10/09/2021 0148  ? ? ?Imaging: ?LONG TERM MONITOR (3-14 DAYS) ? ?Result Date: 10/08/2021 ?14 day extended Holter monitor: Patch Wear Time:  12 days and 11 hours Dominant rhythm sinus rhythm, followed by sinus bradycardia (94% burden) with intermittent junctional escape rhythm. Heart rate 25-116 bpm.  Avg HR 43bpm. No atrial fibrillation, ventricular tachycardia, high grade AV block. Total ventricular ectopic burden <1%. Total supraventricular ectopic burden 2.1%. 488 pauses noted which were 3 seconds or longer. Longest pause:09/19/2021 at 9:17 AM 4.4 seconds. Second longest pause: 09/24/2021 at 6:12 AM 4.3 seconds. Third longest pause: 09/24/2021 at 1:44 PM 4.2 seconds.  Patient triggered events: 0.  ? ?CARDIAC DATABASE: ?EKG: ?September 15, 2021: Sinus bradycardia, 42 bpm, nonspecific T wave abnormality, without underlying injury pattern. ?October 08, 2021: Sinus bradycardia, 48 bpm, without underlying ischemia or injury pattern. ?October 09, 2021: Sinus rhythm with junctional escape 43 bpm. ?  ?Echocardiogram: ?08/28/2021 at [facility]: ?Per report LVEF 60-65%, mildly dilated left atrium, trace MR, mild TR, mild pulmonary hypertension 41 mmHg, no pericardial effusion ? ?Scheduled Meds: ? atorvastatin  80 mg Oral Daily  ? azaTHIOprine  175 mg Oral Q1200  ? fluticasone furoate-vilanterol  1 puff Inhalation Daily  ? And  ? umeclidinium bromide  1 puff Inhalation Daily  ? insulin aspart  0-20 Units Subcutaneous TID WC  ? lisinopril  40 mg Oral Daily  ? sodium chloride flush  3 mL Intravenous Q12H  ? ? ?Continuous Infusions: ? ? ?PRN Meds: ?albuterol, hydrALAZINE, ondansetron (ZOFRAN) IV, ondansetron  ? ?IMPRESSION & RECOMMENDATIONS: ?Stacey Burns is a 66 y.o. African-American female whose past medical history and cardiac risk factors include: Paroxysmal atrial fibrillation, history of direct-current cardioversion, insulin-dependent diabetes mellitus type 2, hyperlipidemia, hypertension, aortic atherosclerosis, history of CAD (per patient), sleep apnea not on CPAP, history of stroke (per EMR), lupus, former smoker, history of COVID-19 infection, postmenopausal woman, obesity due to excess calories. ? ?Impression: ?Symptomatic bradycardia ?Sinus node dysfunction. ?Sinus pause. ?Paroxysmal atrial fibrillation. ?Long-term oral anticoagulation. ?History of direct-current cardioversion ?Insulin-dependent diabetes mellitus type 2. ?Hypertension. ?Hyperlipidemia. ?Aortic atherosclerosis. ?History of sleep apnea not on CPAP. ?Lupus. ?Former smoker. ?History of COVID-19 infection. ? ?Recommendations: ?Patient was electively admitted to the hospital given her  symptomatic bradycardia and cardiac monitor performed in an ambulatory setting concerning for sinus node dysfunction.  She was noted to have approximately 488 pauses which were 3 seconds or longer and the longest pause was 4.4 seconds during the daytime.  She is symptomatic. ? ?No identifiable reversible causes. ? ?Given her history of atrial fibrillation she was on oral anticoagulation.  Last dose 10/07/2021 per patient. ? ?Cardiac electrophysiology has evaluated the patient this morning, recommendations greatly appreciated.  Tentatively scheduled for pacemaker on Monday, Oct 13, 2021.  Final recommendations are still pending requested the EP team to consider pacemaker sooner if medically safe as patient would like to attend her brother's funeral this weekend. ? ?Hemoglobin A1c is very well controlled.  Continue sliding scale insulin. ? ?Fasting lipid profile reviewed, well controlled, continue current statin therapy. ? ?Continue telemetry. ? ?Patient's questions and concerns were addressed to her satisfaction. She voices understanding of the instructions provided during this encounter.  ? ?This note was created using a voice recognition software as a result there may be grammatical errors inadvertently enclosed that do not reflect the nature of this encounter. Every attempt is made to correct  such errors. ? ?Total time spent: 35 minutes. ? ?Rex Kras, DO, FACC ? ?Pager: [phone] ?Office: [phone] ?10/09/2021, 12:24 PM   ? ? ?

## 2021-10-10 LAB — CBC
HCT: 37.6 % (ref 36.0–46.0)
Hemoglobin: 13.1 g/dL (ref 12.0–15.0)
MCH: 35.1 pg — ABNORMAL HIGH (ref 26.0–34.0)
MCHC: 34.8 g/dL (ref 30.0–36.0)
MCV: 100.8 fL — ABNORMAL HIGH (ref 80.0–100.0)
Platelets: 110 10*3/uL — ABNORMAL LOW (ref 150–400)
RBC: 3.73 MIL/uL — ABNORMAL LOW (ref 3.87–5.11)
RDW: 19.5 % — ABNORMAL HIGH (ref 11.5–15.5)
WBC: 3.5 10*3/uL — ABNORMAL LOW (ref 4.0–10.5)
nRBC: 0 % (ref 0.0–0.2)

## 2021-10-10 LAB — GLUCOSE, CAPILLARY
Glucose-Capillary: 119 mg/dL — ABNORMAL HIGH (ref 70–99)
Glucose-Capillary: 183 mg/dL — ABNORMAL HIGH (ref 70–99)
Glucose-Capillary: 125 mg/dL — ABNORMAL HIGH (ref 70–99)
Glucose-Capillary: 221 mg/dL — ABNORMAL HIGH (ref 70–99)

## 2021-10-10 LAB — BASIC METABOLIC PANEL
Anion gap: 3 — ABNORMAL LOW (ref 5–15)
BUN: 12 mg/dL (ref 8–23)
CO2: 23 mmol/L (ref 22–32)
Calcium: 8.1 mg/dL — ABNORMAL LOW (ref 8.9–10.3)
Chloride: 110 mmol/L (ref 98–111)
Creatinine, Ser: 0.99 mg/dL (ref 0.44–1.00)
GFR, Estimated: >60 mL/min (ref >60)
Glucose, Bld: 188 mg/dL — ABNORMAL HIGH (ref 70–99)
Potassium: 4 mmol/L (ref 3.5–5.1)
Sodium: 136 mmol/L (ref 135–145)

## 2021-10-10 NOTE — Progress Notes (Signed)
?  Transition of Care (TOC) Screening Note ? ? ?Patient Details  ?Name: Stacey Burns ?Date of Birth: 03-26-1956 ? ? ?Transition of Care (TOC) CM/SW Contact:    ?Zenda Alpers, Lenn Sink, RN ?Phone Number: ?10/10/2021, 2:39 PM ? ? ? ?Transition of Care Department [facility]) has reviewed patient and no TOC needs have been identified at this time. We will continue to monitor patient advancement through interdisciplinary progression rounds. If new patient transition needs arise, please place a TOC consult. ?  ?

## 2021-10-10 NOTE — Care Management Important Message (Signed)
Important Message ? ?Patient Details  ?Name: Stacey Burns ?MRN: [id number] ?Date of Birth: [birth year] ? ? ?Medicare Important Message Given:  Yes ? ? ? ? ?Stacey Burns ?10/10/2021, 7:39 AM ?

## 2021-10-10 NOTE — Progress Notes (Signed)
Progress Note ? ?[name] ?Date of Encounter: 10/10/2021 ? ?Attending physician: Rex Kras, DO ?Primary care provider: Andria Frames, PA-C ? ?Subjective: ?Stacey Burns is a 66 y.o. female who was seen and examined at bedside at approximately 8:00 AM  ?Patient denies chest pain, syncope, near syncope, shortness of breath.  Denies dizziness. ?Telemetry reviewed, heart rate 30-50s beats per minute.  No pauses >3 seconds in the last 24 hours. ? ?Objective: ?Vital Signs in the last 24 hours: ?Temp:  [98 ?F (36.7 ?C)-98.8 ?F (37.1 ?C)] 98 ?F (36.7 ?C) (04/28 [id number]) ?Pulse Rate:  [47-55] 51 (04/28 0821) ?Resp:  [12-18] 16 (04/28 [id number]) ?BP: (135-154)/(46-65) 140/54 (04/28 [id number]) ?SpO2:  [95 %-100 %] 100 % (04/28 [id number]) ?Weight:  [114.1 kg] 114.1 kg (04/28 0500) ? ?Intake/Output: ? ?Intake/Output Summary (Last 24 hours) at 10/10/2021 0903 ?Last data filed at 10/09/2021 2000 ?Gross per 24 hour  ?Intake 243 ml  ?Output --  ?Net 243 ml  ?  ?Net IO Since Admission: 603 mL [10/10/21 0903] ? ?Weights:  ?Filed Weights  ? 10/08/21 1643 10/09/21 0431 10/10/21 0500  ?Weight: 113.6 kg 112.7 kg 114.1 kg  ? ? ?Telemetry: ?Sinus bradycardia.  Heart rate 30s-50s bpm  ? ?Physical examination: ?PHYSICAL EXAM: ? ?  10/10/2021  ?  8:21 AM 10/10/2021  ?  8:07 AM 10/10/2021  ?  5:00 AM  ?Vitals with BMI  ?Weight   251 lbs 9 oz  ?BMI   43.16  ?Systolic [id number]    ?Diastolic 54    ?Pulse 51 51   ? ? ?General: Appears older than stated age, hemodynamically stable, ambulates with a cane, no acute distress. ?HEENT: Normocephalic, atraumatic, no scleral icterus or xanthelasmas, no JVP, no carotid bruits. ?Heart: Bradycardic, regular, positive S1-S2, no murmurs rubs or gallops appreciated. ?Chest: Clear to auscultation bilaterally, equal rise and fall of chest cavity, no wheezes rales or rhonchi's. ?Abdomen: Obese, soft, nontender, nondistended, positive bowel sounds in all 4 quadrants. ?Extremities: No pitting edema, warm  to touch, discoid rash present. ?Neuro: Cranial nerves II to XII are grossly intact, nonfocal, normal muscle tone. ? ?Lab Results: ?Hematology ?Recent Labs  ?Lab 10/08/21 ?1816  ?WBC 4.2  ?RBC 3.79*  ?HGB 13.3  ?HCT 38.9  ?MCV 102.6*  ?MCH 35.1*  ?MCHC 34.2  ?RDW 19.3*  ?PLT 113*  ? ? ?Chemistry ?Recent Labs  ?Lab 10/08/21 ?1816  ?NA 139  ?K 4.5  ?CL 110  ?CO2 25  ?GLUCOSE 93  ?BUN 14  ?CREATININE 1.07*  ?CALCIUM 8.7*  ?PROT 7.4  ?ALBUMIN 2.6*  ?AST 96*  ?ALT 58*  ?ALKPHOS 126  ?BILITOT 1.1  ?GFRNONAA 58*  ?ANIONGAP 4*  ?  ? ?Cardiac Enzymes: ?Cardiac Panel (last 3 results) ?No results for input(s): CKTOTAL, CKMB, TROPONINIHS, RELINDX in the last 72 hours. ? ?BNP (last 3 results) ?Recent Labs  ?  10/08/21 ?1816  ?BNP 211.0*  ? ? ?ProBNP (last 3 results) ?No results for input(s): PROBNP in the last 8760 hours. ? ? ?DDimer No results for input(s): DDIMER in the last 168 hours.  ? ?Hemoglobin A1c:  ?Lab Results  ?Component Value Date  ? HGBA1C 6.6 (H) 10/08/2021  ? MPG 142.72 10/08/2021  ? ? ?TSH  ?Recent Labs  ?  10/08/21 ?1816  ?TSH 1.216  ? ? ?Lipid Panel  ?   ?Component Value Date/Time  ? CHOL 66 10/09/2021 0148  ? TRIG 12 10/09/2021 0148  ? HDL 30 (L) 10/09/2021 0148  ?  CHOLHDL 2.2 10/09/2021 0148  ? VLDL 2 10/09/2021 0148  ? [place] 34 10/09/2021 0148  ? LDLDIRECT 24.3 10/09/2021 0148  ? ? ?Imaging: ?LONG TERM MONITOR (3-14 DAYS) ? ?Result Date: 10/08/2021 ?14 day extended Holter monitor: Patch Wear Time:  12 days and 11 hours Dominant rhythm sinus rhythm, followed by sinus bradycardia (94% burden) with intermittent junctional escape rhythm. Heart rate 25-116 bpm.  Avg HR 43bpm. No atrial fibrillation, ventricular tachycardia, high grade AV block. Total ventricular ectopic burden <1%. Total supraventricular ectopic burden 2.1%. 488 pauses noted which were 3 seconds or longer. Longest pause:09/19/2021 at 9:17 AM 4.4 seconds. Second longest pause: 09/24/2021 at 6:12 AM 4.3 seconds. Third longest pause: 09/24/2021 at  1:44 PM 4.2 seconds. Patient triggered events: 0.  ? ?CARDIAC DATABASE: ?EKG: ?September 15, 2021: Sinus bradycardia, 42 bpm, nonspecific T wave abnormality, without underlying injury pattern. ?October 08, 2021: Sinus bradycardia, 48 bpm, without underlying ischemia or injury pattern. ?October 09, 2021: Sinus rhythm with junctional escape 43 bpm. ?  ?Echocardiogram: ?08/28/2021 at [facility]: ?Per report LVEF 60-65%, mildly dilated left atrium, trace MR, mild TR, mild pulmonary hypertension 41 mmHg, no pericardial effusion ? ?Scheduled Meds: ? atorvastatin  80 mg Oral Daily  ? azaTHIOprine  175 mg Oral Q1200  ? fluticasone furoate-vilanterol  1 puff Inhalation Daily  ? And  ? umeclidinium bromide  1 puff Inhalation Daily  ? insulin aspart  0-20 Units Subcutaneous TID WC  ? lisinopril  40 mg Oral Daily  ? sodium chloride flush  3 mL Intravenous Q12H  ? ? ?Continuous Infusions: ? ? ?PRN Meds: ?albuterol, hydrALAZINE, ondansetron (ZOFRAN) IV, ondansetron  ? ?IMPRESSION & RECOMMENDATIONS: ?Stacey Burns is a 66 y.o. African-American female whose past medical history and cardiac risk factors include: Paroxysmal atrial fibrillation, history of direct-current cardioversion, insulin-dependent diabetes mellitus type 2, hyperlipidemia, hypertension, aortic atherosclerosis, history of CAD (per patient), sleep apnea not on CPAP, history of stroke (per EMR), lupus, former smoker, history of COVID-19 infection, postmenopausal woman, obesity due to excess calories. ? ?Impression: ?Symptomatic bradycardia ?Sinus node dysfunction. ?Sinus pause. ?Paroxysmal atrial fibrillation. ?Long-term oral anticoagulation. ?History of direct-current cardioversion ?Insulin-dependent diabetes mellitus type 2. ?Hypertension. ?Hyperlipidemia. ?Aortic atherosclerosis. ?History of sleep apnea not on CPAP. ?Lupus. ?Former smoker. ?History of COVID-19 infection. ? ?Recommendations: ?Patient was admitted to the hospital  given symptomatic bradycardia and ambulatory cardiac monitor revealing for 488 pauses that were >3 seconds with the longest pause 4.4 seconds. ? ?She does have history of atrial fibrillation, therefore has been on oral anticoagulation with the last dose 10/07/2021 per patient.  Patient has been evaluated by electrophysiology, no identifiable reversible causes, therefore plan is to undergo pacemaker implantation for sinus node dysfunction on Monday, Oct 13, 2021.  Continue telemetry. ? ?In regard to diabetes mellitus, continue sliding scale insulin.  Given hyperlipidemia continue statin therapy.  ? ?Patient was seen in collaboration with Dr. Terri Skains.  ? ? ?Stacey Berthold, PA-C ?10/10/2021, 9:13 AM ?Office: [phone] ? ?

## 2021-10-10 NOTE — Progress Notes (Signed)
Telemetry reviewed SB high 30's-50's ?No AV block or pausing ?Given high dose xarelto coming in, planned for PPM on Monday with Dr. Lalla Brothers. ?Dr. Lalla Brothers discussed staying in patient/rational for timing of pacing with the patient and her brother (on the phone) yesterday, they were in agreement/understood. ?She is on the schedule, orders written. ?EP will see again Monday AM, please recall if needed sooner ? ?Francis Dowse, PA-C ?

## 2021-10-11 DIAGNOSIS — R001 Bradycardia, unspecified: Secondary | ICD-10-CM

## 2021-10-11 LAB — CBC
HCT: 35.4 % — ABNORMAL LOW (ref 36.0–46.0)
Hemoglobin: 12.2 g/dL (ref 12.0–15.0)
MCH: 34.4 pg — ABNORMAL HIGH (ref 26.0–34.0)
MCHC: 34.5 g/dL (ref 30.0–36.0)
MCV: 99.7 fL (ref 80.0–100.0)
Platelets: 98 10*3/uL — ABNORMAL LOW (ref 150–400)
RBC: 3.55 MIL/uL — ABNORMAL LOW (ref 3.87–5.11)
RDW: 19.6 % — ABNORMAL HIGH (ref 11.5–15.5)
WBC: 4.4 10*3/uL (ref 4.0–10.5)
nRBC: 0 % (ref 0.0–0.2)

## 2021-10-11 LAB — GLUCOSE, CAPILLARY
Glucose-Capillary: 105 mg/dL — ABNORMAL HIGH (ref 70–99)
Glucose-Capillary: 164 mg/dL — ABNORMAL HIGH (ref 70–99)
Glucose-Capillary: 180 mg/dL — ABNORMAL HIGH (ref 70–99)
Glucose-Capillary: 122 mg/dL — ABNORMAL HIGH (ref 70–99)

## 2021-10-11 LAB — BASIC METABOLIC PANEL
Anion gap: 3 — ABNORMAL LOW (ref 5–15)
BUN: 14 mg/dL (ref 8–23)
CO2: 25 mmol/L (ref 22–32)
Calcium: 8 mg/dL — ABNORMAL LOW (ref 8.9–10.3)
Chloride: 111 mmol/L (ref 98–111)
Creatinine, Ser: 1.1 mg/dL — ABNORMAL HIGH (ref 0.44–1.00)
GFR, Estimated: 55 mL/min — ABNORMAL LOW (ref >60)
Glucose, Bld: 120 mg/dL — ABNORMAL HIGH (ref 70–99)
Potassium: 4.2 mmol/L (ref 3.5–5.1)
Sodium: 139 mmol/L (ref 135–145)

## 2021-10-11 NOTE — Progress Notes (Signed)
Progress Note ? ?[name] ?Date of Encounter: 10/11/2021 ? ?Attending physician: Tessa Lerner, DO ?Primary care provider: Virgilio Belling, PA-C ? ?Subjective: ?Stacey Burns is a 66 y.o. female who was seen and examined at bedside  ?Remains asymptomatic. ?Sitting up in chair. ?Telemetry personally reviewed and discussed with central telemetry over the last 24 hours heart rate ranging between 30-40 bpm, no pauses greater than 3 seconds ? ?Objective: ?Vital Signs in the last 24 hours: ?Temp:  [98.1 ?F (36.7 ?C)-98.4 ?F (36.9 ?C)] 98.2 ?F (36.8 ?C) (04/29 1300) ?Pulse Rate:  [38-52] 38 (04/29 1300) ?Resp:  [12-20] 18 (04/29 1300) ?BP: (108-159)/(44-66) 139/59 (04/29 1300) ?SpO2:  [96 %-99 %] 99 % (04/29 1300) ?Weight:  [112.3 kg] 112.3 kg (04/29 0401) ? ?Intake/Output: ? ?Intake/Output Summary (Last 24 hours) at 10/11/2021 1508 ?Last data filed at 10/11/2021 0900 ?Gross per 24 hour  ?Intake 480 ml  ?Output --  ?Net 480 ml  ?  ?Net IO Since Admission: 1,326 mL [10/11/21 1508] ? ?Weights:  ?Filed Weights  ? 10/09/21 0431 10/10/21 0500 10/11/21 0401  ?Weight: 112.7 kg 114.1 kg 112.3 kg  ? ? ?Telemetry: Personally reviewed. SB w/ intermittent junctional rhythm, no pauses >3sec ? ?Physical examination: ?PHYSICAL EXAM: ? ?  10/11/2021  ?  1:00 PM 10/11/2021  ?  9:18 AM 10/11/2021  ?  9:14 AM  ?Vitals with BMI  ?Systolic 139 157 962  ?Diastolic 59 62 62  ?Pulse 38 47 51  ? ? ?General: Appears older than stated age, hemodynamically stable, ambulates with a cane, no acute distress. ?HEENT: Normocephalic, atraumatic, no scleral icterus or xanthelasmas, no JVP, no carotid bruits. ?Heart: Bradycardic, regular, positive S1-S2, no murmurs rubs or gallops appreciated. ?Chest: Clear to auscultation bilaterally, equal rise and fall of chest cavity, no wheezes rales or rhonchi's. ?Abdomen: Is obese, soft, nontender, nondistended, positive bowel sounds in all 4 quadrants. ?Extremities: No pitting edema, warm  to touch, discoid rash present.  SCDs present ?Neuro: Cranial nerves II to XII are grossly intact, nonfocal, normal muscle tone. ? ?Lab Results: ?Hematology ?Recent Labs  ?Lab 10/08/21 ?1816 10/10/21 ?2297 10/11/21 ?0102  ?WBC 4.2 3.5* 4.4  ?RBC 3.79* 3.73* 3.55*  ?HGB 13.3 13.1 12.2  ?HCT 38.9 37.6 35.4*  ?MCV 102.6* 100.8* 99.7  ?MCH 35.1* 35.1* 34.4*  ?MCHC 34.2 34.8 34.5  ?RDW 19.3* 19.5* 19.6*  ?PLT 113* 110* 98*  ? ? ?Chemistry ?Recent Labs  ?Lab 10/08/21 ?1816 10/10/21 ?9892 10/11/21 ?0102  ?NA 139 136 139  ?K 4.5 4.0 4.2  ?CL 110 110 111  ?CO2 25 23 25   ?GLUCOSE 93 188* 120*  ?BUN 14 12 14   ?CREATININE 1.07* 0.99 1.10*  ?CALCIUM 8.7* 8.1* 8.0*  ?PROT 7.4  --   --   ?ALBUMIN 2.6*  --   --   ?AST 96*  --   --   ?ALT 58*  --   --   ?ALKPHOS 126  --   --   ?BILITOT 1.1  --   --   ?GFRNONAA 58* >60 55*  ?ANIONGAP 4* 3* 3*  ?  ? ?Cardiac Enzymes: ?Cardiac Panel (last 3 results) ?No results for input(s): CKTOTAL, CKMB, TROPONINIHS, RELINDX in the last 72 hours. ? ?BNP (last 3 results) ?Recent Labs  ?  10/08/21 ?1816  ?BNP 211.0*  ? ? ?ProBNP (last 3 results) ?No results for input(s): PROBNP in the last 8760 hours. ? ? ?DDimer No results for input(s): DDIMER in the last 168  hours.  ? ?Hemoglobin A1c:  ?Lab Results  ?Component Value Date  ? HGBA1C 6.6 (H) 10/08/2021  ? MPG 142.72 10/08/2021  ? ? ?TSH  ?Recent Labs  ?  10/08/21 ?1816  ?TSH 1.216  ? ? ?Lipid Panel  ?   ?Component Value Date/Time  ? CHOL 66 10/09/2021 0148  ? TRIG 12 10/09/2021 0148  ? HDL 30 (L) 10/09/2021 0148  ? CHOLHDL 2.2 10/09/2021 0148  ? VLDL 2 10/09/2021 0148  ? LDLCALC 34 10/09/2021 0148  ? LDLDIRECT 24.3 10/09/2021 0148  ? ? ?Imaging: ?No results found. ? ?CARDIAC DATABASE: ?EKG: ?September 15, 2021: Sinus bradycardia, 42 bpm, nonspecific T wave abnormality, without underlying injury pattern. ?October 08, 2021: Sinus bradycardia, 48 bpm, without underlying ischemia or injury pattern. ?October 09, 2021: Sinus rhythm with junctional escape 43 bpm. ?   ?Echocardiogram: ?08/28/2021 at [facility]: ?Per report LVEF 60-65%, mildly dilated left atrium, trace MR, mild TR, mild pulmonary hypertension 41 mmHg, no pericardial effusion ? ?Scheduled Meds: ? atorvastatin  80 mg Oral Daily  ? azaTHIOprine  175 mg Oral Q1200  ? fluticasone furoate-vilanterol  1 puff Inhalation Daily  ? And  ? umeclidinium bromide  1 puff Inhalation Daily  ? insulin aspart  0-20 Units Subcutaneous TID WC  ? lisinopril  40 mg Oral Daily  ? sodium chloride flush  3 mL Intravenous Q12H  ? ? ?Continuous Infusions: ? ? ?PRN Meds: ?albuterol, hydrALAZINE, ondansetron (ZOFRAN) IV, ondansetron  ? ?IMPRESSION & RECOMMENDATIONS: ?Stacey Burns is a 66 y.o. African-American female whose past medical history and cardiac risk factors include: Paroxysmal atrial fibrillation, history of direct-current cardioversion, insulin-dependent diabetes mellitus type 2, hyperlipidemia, hypertension, aortic atherosclerosis, history of CAD (per patient), sleep apnea not on CPAP, history of stroke (per EMR), lupus, former smoker, history of COVID-19 infection, postmenopausal woman, obesity due to excess calories. ? ?Impression: ?Symptomatic bradycardia ?Sinus node dysfunction. ?Sinus pause. ?Paroxysmal atrial fibrillation. ?Long-term oral anticoagulation. ?History of direct-current cardioversion ?Insulin-dependent diabetes mellitus type 2. ?Hypertension. ?Hyperlipidemia. ?Aortic atherosclerosis. ?History of sleep apnea not on CPAP. ?Lupus. ?Former smoker. ?History of COVID-19 infection. ? ?Recommendations: ?Electively admitted to the hospital for symptomatic bradycardia as a cardiac monitor noted findings to suggest sinus node dysfunction.  The longest pause was 4.4 seconds. ? ?No identifiable reversible cause.  Therefore consulted electrophysiology for possible pacemaker evaluation.  Given the recent use of oral anticoagulants given her history of paroxysmal atrial fibrillation  shared decision was to plan for pacemaker implantation on Monday Oct 13, 2021. ? ?Currently patient is not on oral anticoagulation for thromboembolic prophylaxis.  And also not on subcu heparin for DVT prophylaxis.  She does have SCDs that she is wearing intermittently.  I have asked her to walk in the hallways with assistance as well. ? ?Her telemetry personally reviewed and also discussed with central telemetry as well. ? ?Hemoglobin A1c is very well controlled.  Continue sliding scale insulin. ? ?Fasting lipid profile reviewed, well controlled, continue current statin therapy. ? ?Plan of care discussed with the patient and her brother over the phone. ? ?This note was created using a voice recognition software as a result there may be grammatical errors inadvertently enclosed that do not reflect the nature of this encounter. Every attempt is made to correct such errors. ? ?Total time spent: 25 minutes. ? ?Tessa Lerner, DO, FACC ? ?Pager: [phone] ?Office: [phone] ?10/11/2021, 3:08 PM   ? ? ?

## 2021-10-12 LAB — CBC
HCT: 37.2 % (ref 36.0–46.0)
Hemoglobin: 12.6 g/dL (ref 12.0–15.0)
MCH: 34.4 pg — ABNORMAL HIGH (ref 26.0–34.0)
MCHC: 33.9 g/dL (ref 30.0–36.0)
MCV: 101.6 fL — ABNORMAL HIGH (ref 80.0–100.0)
Platelets: 107 10*3/uL — ABNORMAL LOW (ref 150–400)
RBC: 3.66 MIL/uL — ABNORMAL LOW (ref 3.87–5.11)
RDW: 19.8 % — ABNORMAL HIGH (ref 11.5–15.5)
WBC: 3.8 10*3/uL — ABNORMAL LOW (ref 4.0–10.5)
nRBC: 0 % (ref 0.0–0.2)

## 2021-10-12 LAB — BASIC METABOLIC PANEL
Anion gap: 4 — ABNORMAL LOW (ref 5–15)
BUN: 13 mg/dL (ref 8–23)
CO2: 26 mmol/L (ref 22–32)
Calcium: 8.1 mg/dL — ABNORMAL LOW (ref 8.9–10.3)
Chloride: 108 mmol/L (ref 98–111)
Creatinine, Ser: 1.05 mg/dL — ABNORMAL HIGH (ref 0.44–1.00)
GFR, Estimated: 59 mL/min — ABNORMAL LOW (ref >60)
Glucose, Bld: 123 mg/dL — ABNORMAL HIGH (ref 70–99)
Potassium: 4 mmol/L (ref 3.5–5.1)
Sodium: 138 mmol/L (ref 135–145)

## 2021-10-12 LAB — GLUCOSE, CAPILLARY
Glucose-Capillary: 119 mg/dL — ABNORMAL HIGH (ref 70–99)
Glucose-Capillary: 116 mg/dL — ABNORMAL HIGH (ref 70–99)
Glucose-Capillary: 194 mg/dL — ABNORMAL HIGH (ref 70–99)
Glucose-Capillary: 204 mg/dL — ABNORMAL HIGH (ref 70–99)

## 2021-10-12 MED ORDER — CHLORHEXIDINE GLUCONATE 4 % EX LIQD
60.0000 mL | Freq: Once | CUTANEOUS | Status: AC
Start: 2021-10-13 — End: 2021-10-13
  Administered 2021-10-13: 4 via TOPICAL
  Filled 2021-10-12: qty 60

## 2021-10-12 MED ORDER — SODIUM CHLORIDE 0.9 % IV SOLN: 250.0000 mL | INTRAVENOUS | Status: DC

## 2021-10-12 MED ORDER — SODIUM CHLORIDE 0.9% FLUSH: 3.0000 mL | INTRAVENOUS | Status: DC | PRN

## 2021-10-12 MED ORDER — VANCOMYCIN HCL 1500 MG/300ML IV SOLN
1500.0000 mg | INTRAVENOUS | Status: AC
  Administered 2021-10-13: 1500 mg via INTRAVENOUS
  Filled 2021-10-12: qty 300

## 2021-10-12 MED ORDER — SODIUM CHLORIDE 0.9 % IV SOLN: INTRAVENOUS | Status: DC

## 2021-10-12 MED ORDER — SODIUM CHLORIDE 0.9 % IV SOLN
80.0000 mg | INTRAVENOUS | Status: AC
  Administered 2021-10-13: 80 mg

## 2021-10-12 MED ORDER — CHLORHEXIDINE GLUCONATE 4 % EX LIQD
60.0000 mL | Freq: Once | CUTANEOUS | Status: AC
Start: 2021-10-12 — End: 2021-10-12
  Administered 2021-10-12: 4 via TOPICAL
  Filled 2021-10-12: qty 60

## 2021-10-12 NOTE — Evaluation (Signed)
Physical Therapy Evaluation ?Patient Details ?Name: Stacey Burns ?MRN: [id number] ?DOB: 16-Apr-1956 ?Today's Date: 10/12/2021 ? ?History of Present Illness ? Pt is a 66 y.o. female who presented 10/08/21 with symptomatic bradicardia/sinus pause. Plan for potential PPM 5/1. PMH: asthma, CAD, DM, HTN, lupus, paroxysmal afib, CVA ?  ?Clinical Impression ? Pt presents with condition above. PTA, she was mod I with a QC for mobility, primarily staying on the main level of her 2-level house with 3-4 STE. She lives with her daughter. She endorses x4 falls in the last year in which she believes was due to getting lightheaded from her bradycardia. Currently, pt is functioning at her baseline. Will continue to follow acutely after she receives her pacemaker to ensure pt comprehends and maintains pacemaker precautions and to address any changes in status if there are any following the procedure. Recommending pt continue to work with HHPT at d/c. ?   ? ?Recommendations for follow up therapy are one component of a multi-disciplinary discharge planning process, led by the attending physician.  Recommendations may be updated based on patient status, additional functional criteria and insurance authorization. ? ?Follow Up Recommendations Home health PT (continue with current HHPT agency) ? ?  ?Assistance Recommended at Discharge PRN  ?Patient can return home with the following ? Assist for transportation ? ?  ?Equipment Recommendations None recommended by PT  ?Recommendations for Other Services ?    ?  ?Functional Status Assessment Patient has not had a recent decline in their functional status  ? ?  ?Precautions / Restrictions Precautions ?Precautions: Fall;Other (comment) ?Precaution Comments: watch HR (low) ?Restrictions ?Weight Bearing Restrictions: No  ? ?  ? ?Mobility ? Bed Mobility ?  ?  ?  ?  ?  ?  ?  ?General bed mobility comments: Up in chair upon arrival. ?  ? ?Transfers ?Overall transfer level: Modified  independent ?Equipment used: Quad cane ?  ?  ?  ?  ?  ?  ?  ?General transfer comment: No LOB, able to come to stand safely without assistance. ?  ? ?Ambulation/Gait ?Ambulation/Gait assistance: Modified independent (Device/Increase time) ?Gait Distance (Feet): 360 Feet ?Assistive device: Quad cane ?Gait Pattern/deviations: Step-through pattern, Decreased stride length ?Gait velocity: reduced ?Gait velocity interpretation: 1.31 - 2.62 ft/sec, indicative of limited community ambulator ?  ?General Gait Details: Pt with slightly slowed, but steady gait with QC. No LOB. ? ?Stairs ?  ?  ?  ?  ?  ? ?Wheelchair Mobility ?  ? ?Modified Rankin (Stroke Patients Only) ?  ? ?  ? ?Balance Overall balance assessment: Mild deficits observed, not formally tested ?  ?  ?  ?  ?  ?  ?  ?  ?  ?  ?  ?  ?  ?  ?  ?  ?  ?  ?   ? ? ? ?Pertinent Vitals/Pain Pain Assessment ?Pain Assessment: Faces ?Faces Pain Scale: No hurt ?Pain Intervention(s): Monitored during session  ? ? ?Home Living Family/patient expects to be discharged to:: Private residence ?Living Arrangements: Children (daughter who works) ?Available Help at Discharge: Family;Available PRN/intermittently ?Type of Home: House ?Home Access: Stairs to enter ?Entrance Stairs-Rails: Right;Left ?Entrance Stairs-Number of Steps: 3-4 ?Alternate Level Stairs-Number of Steps: flight ?Home Layout: Two level ?Home Equipment: Rollator (4 wheels);Cane - quad;Cane - single point;BSC/3in1;Grab bars - tub/shower ?   ?  ?Prior Function Prior Level of Function : Independent/Modified Independent ?  ?  ?  ?  ?  ?  ?Mobility  Comments: Uses QC. x4 falls in past year when gets lightheaded. ?  ?  ? ? ?Hand Dominance  ?   ? ?  ?Extremity/Trunk Assessment  ? Upper Extremity Assessment ?Upper Extremity Assessment: Overall WFL for tasks assessed ?  ? ?Lower Extremity Assessment ?Lower Extremity Assessment: Generalized weakness (noted functionally) ?  ? ?Cervical / Trunk Assessment ?Cervical / Trunk  Assessment: Normal  ?Communication  ? Communication: No difficulties  ?Cognition Arousal/Alertness: Awake/alert ?Behavior During Therapy: [facility] for tasks assessed/performed ?Overall Cognitive Status: Within Functional Limits for tasks assessed ?  ?  ?  ?  ?  ?  ?  ?  ?  ?  ?  ?  ?  ?  ?  ?  ?General Comments: Very aware of her deficits and safety ?  ?  ? ?  ?General Comments General comments (skin integrity, edema, etc.): HR in 30s at rest upon arrival, increased to 50-60s with mobility ? ?  ?Exercises    ? ?Assessment/Plan  ?  ?PT Assessment Patient needs continued PT services  ?PT Problem List Decreased strength;Decreased balance;Decreased mobility;Cardiopulmonary status limiting activity ? ?   ?  ?PT Treatment Interventions DME instruction;Gait training;Functional mobility training;Therapeutic activities;Therapeutic exercise;Stair training;Balance training;Neuromuscular re-education;Patient/family education   ? ?PT Goals (Current goals can be found in the Care Plan section)  ?Acute Rehab PT Goals ?Patient Stated Goal: to get her HR under control ?PT Goal Formulation: With patient ?Time For Goal Achievement: 10/26/21 ?Potential to Achieve Goals: Good ? ?  ?Frequency Min 2X/week ?  ? ? ?Co-evaluation   ?  ?  ?  ?  ? ? ?  ?AM-PAC PT "6 Clicks" Mobility  ?Outcome Measure Help needed turning from your back to your side while in a flat bed without using bedrails?: None ?Help needed moving from lying on your back to sitting on the side of a flat bed without using bedrails?: None ?Help needed moving to and from a bed to a chair (including a wheelchair)?: None ?Help needed standing up from a chair using your arms (e.g., wheelchair or bedside chair)?: None ?Help needed to walk in hospital room?: None ?Help needed climbing 3-5 steps with a railing? : A Little ?6 Click Score: 23 ? ?  ?End of Session   ?Activity Tolerance: Patient tolerated treatment well ?Patient left: in chair;with call bell/phone within reach;with SCD's  reapplied ?Nurse Communication: Mobility status ?PT Visit Diagnosis: Unsteadiness on feet (R26.81);Other abnormalities of gait and mobility (R26.89) ?  ? ?Time: 0932-6712 ?PT Time Calculation (min) (ACUTE ONLY): 9 min ? ? ?Charges:   PT Evaluation ?$PT Eval Low Complexity: 1 Low ?  ?  ?   ? ? ?Raymond Gurney, PT, DPT ?Acute Rehabilitation Services  ?Pager: [phone] ?Office: [phone] ? ? ?Henrene Dodge Pettis ?10/12/2021, 10:49 AM ? ?

## 2021-10-12 NOTE — Progress Notes (Signed)
Progress Note ? ?[name] ?Date of Encounter: 10/12/2021 ? ?Attending physician: Stacey Kras, DO ?Primary care provider: Andria Frames, PA-C ? ?Subjective: ?Stacey Burns is a 66 y.o. female who was seen and examined at bedside  ?Shortness of breath, after laying activities ?Sitting up in chair. ?Episodes of sinus bradycardia with junctional escape. ?3.68-second pause at 7:49 AM 10/12/2021 ? ?Objective: ?Vital Signs in the last 24 hours: ?Temp:  [98 ?F (36.7 ?C)-98.6 ?F (37 ?C)] 98 ?F (36.7 ?C) (04/30 1206) ?Pulse Rate:  [38-55] 55 (04/30 1206) ?Resp:  [15-20] 17 (04/30 1206) ?BP: (117-149)/(45-67) 124/52 (04/30 1206) ?SpO2:  [97 %-100 %] 100 % (04/30 1206) ?Weight:  [114.2 kg] 114.2 kg (04/30 0453) ? ?Intake/Output: ? ?Intake/Output Summary (Last 24 hours) at 10/12/2021 1238 ?Last data filed at 10/11/2021 1800 ?Gross per 24 hour  ?Intake 200 ml  ?Output --  ?Net 200 ml  ?  ?Net IO Since Admission: 1,526 mL [10/12/21 1238] ? ?Weights:  ?Filed Weights  ? 10/10/21 0500 10/11/21 0401 10/12/21 0453  ?Weight: 114.1 kg 112.3 kg 114.2 kg  ? ? ?Telemetry: Personally reviewed. SB w/ intermittent junctional rhythm, 3.68-second pause 7:49 AM 10/12/21 ? ?Physical examination: ?PHYSICAL EXAM: ? ?  10/12/2021  ? 12:06 PM 10/12/2021  ?  9:04 AM 10/12/2021  ?  4:53 AM  ?Vitals with BMI  ?Weight   251 lbs 12 oz  ?BMI   43.19  ?Systolic [id number]   ?Diastolic 52 45   ?Pulse 55 45   ? ? ?General: Appears older than stated age, hemodynamically stable, ambulates with a cane, no acute distress. ?HEENT: Normocephalic, atraumatic, no scleral icterus or xanthelasmas, no JVP, no carotid bruits. ?Heart: Bradycardic, regular, positive S1-S2, no murmurs rubs or gallops appreciated. ?Chest: Clear to auscultation bilaterally, equal rise and fall of chest cavity, no wheezes rales or rhonchi's. ?Abdomen: Is obese, soft, nontender, nondistended, positive bowel sounds in all 4 quadrants. ?Extremities: No pitting edema,  warm to touch, discoid rash present.  SCDs present ?Neuro: Cranial nerves II to XII are grossly intact, nonfocal, normal muscle tone. ? ?Lab Results: ?Hematology ?Recent Labs  ?Lab 10/10/21 ?0905 10/11/21 ?0102 10/12/21 ?0330  ?WBC 3.5* 4.4 3.8*  ?RBC 3.73* 3.55* 3.66*  ?HGB 13.1 12.2 12.6  ?HCT 37.6 35.4* 37.2  ?MCV 100.8* 99.7 101.6*  ?MCH 35.1* 34.4* 34.4*  ?MCHC 34.8 34.5 33.9  ?RDW 19.5* 19.6* 19.8*  ?PLT 110* 98* 107*  ? ? ?Chemistry ?Recent Labs  ?Lab 10/08/21 ?1816 10/10/21 ?[id number] 10/11/21 ?0102 10/12/21 ?0330  ?NA 139 136 139 138  ?K 4.5 4.0 4.2 4.0  ?CL 110 110 111 108  ?CO2 25 23 25 26   ?GLUCOSE 93 188* 120* 123*  ?BUN 14 12 14 13   ?CREATININE 1.07* 0.99 1.10* 1.05*  ?CALCIUM 8.7* 8.1* 8.0* 8.1*  ?PROT 7.4  --   --   --   ?ALBUMIN 2.6*  --   --   --   ?AST 96*  --   --   --   ?ALT 58*  --   --   --   ?ALKPHOS 126  --   --   --   ?BILITOT 1.1  --   --   --   ?GFRNONAA 58* >60 55* 59*  ?ANIONGAP 4* 3* 3* 4*  ?  ? ?Cardiac Enzymes: ?Cardiac Panel (last 3 results) ?No results for input(s): CKTOTAL, CKMB, TROPONINIHS, RELINDX in the last 72 hours. ? ?BNP (last 3 results) ?Recent Labs  ?  10/08/21 ?1816  ?BNP 211.0*  ? ? ?ProBNP (last 3 results) ?No results for input(s): PROBNP in the last 8760 hours. ? ? ?DDimer No results for input(s): DDIMER in the last 168 hours.  ? ?Hemoglobin A1c:  ?Lab Results  ?Component Value Date  ? HGBA1C 6.6 (H) 10/08/2021  ? MPG 142.72 10/08/2021  ? ? ?TSH  ?Recent Labs  ?  10/08/21 ?1816  ?TSH 1.216  ? ? ?Lipid Panel  ?   ?Component Value Date/Time  ? CHOL 66 10/09/2021 0148  ? TRIG 12 10/09/2021 0148  ? HDL 30 (L) 10/09/2021 0148  ? CHOLHDL 2.2 10/09/2021 0148  ? VLDL 2 10/09/2021 0148  ? [place] 34 10/09/2021 0148  ? LDLDIRECT 24.3 10/09/2021 0148  ? ? ?Imaging: ?No results found. ? ?CARDIAC DATABASE: ?EKG: ?September 15, 2021: Sinus bradycardia, 42 bpm, nonspecific T wave abnormality, without underlying injury pattern. ?October 08, 2021: Sinus bradycardia, 48 bpm, without underlying  ischemia or injury pattern. ?October 09, 2021: Sinus rhythm with junctional escape 43 bpm. ?  ?Echocardiogram: ?08/28/2021 at [facility]: ?Per report LVEF 60-65%, mildly dilated left atrium, trace MR, mild TR, mild pulmonary hypertension 41 mmHg, no pericardial effusion ? ?Scheduled Meds: ? atorvastatin  80 mg Oral Daily  ? azaTHIOprine  175 mg Oral Q1200  ? fluticasone furoate-vilanterol  1 puff Inhalation Daily  ? And  ? umeclidinium bromide  1 puff Inhalation Daily  ? insulin aspart  0-20 Units Subcutaneous TID WC  ? lisinopril  40 mg Oral Daily  ? sodium chloride flush  3 mL Intravenous Q12H  ? ? ?Continuous Infusions: ? ? ?PRN Meds: ?albuterol, hydrALAZINE, ondansetron (ZOFRAN) IV, ondansetron  ? ?IMPRESSION & RECOMMENDATIONS: ?Stacey Burns is a 66 y.o. African-American female whose past medical history and cardiac risk factors include: Paroxysmal atrial fibrillation, history of direct-current cardioversion, insulin-dependent diabetes mellitus type 2, hyperlipidemia, hypertension, aortic atherosclerosis, history of CAD (per patient), sleep apnea not on CPAP, history of stroke (per EMR), lupus, former smoker, history of COVID-19 infection, postmenopausal woman, obesity due to excess calories. ? ?Impression: ?Symptomatic bradycardia w/ intermittent junctional escape  ?Sinus node dysfunction. ?Sinus pause. ?Paroxysmal atrial fibrillation. ?Long-term oral anticoagulation. ?History of direct-current cardioversion ?Insulin-dependent diabetes mellitus type 2. ?Hypertension. ?Hyperlipidemia. ?Aortic atherosclerosis. ?History of sleep apnea not on CPAP. ?Lupus. ?Former smoker. ?History of COVID-19 infection. ? ?Recommendations: ?Electively admitted to the hospital for symptomatic bradycardia as a cardiac monitor noted findings to suggest sinus node dysfunction.  The longest pause was 4.4 seconds. ? ?No identifiable reversible cause.  EP has evaluated the patient and plans for  PPM on 10/13/2021.  ? ?She remains bradycardic and intermittent junctional escape. Longest pauses over the last 24hr 3.68sec on telemetry this morning at 749am. Patient continues have shortness of breath w/ ambulation.  ? ?Currently patient is not on oral anticoagulation for thromboembolic prophylaxis.  And also not on subcu heparin for DVT prophylaxis.  She does have SCDs that she is wearing intermittently.  I have asked her to walk in the hallways with assistance as well. Will need guidance from EP after the PPM implant w/ regards to intiating [place] keeping in mind her risk (Lupus, PAFib, etc).  ? ?NPO after midnight.  ? ?Her telemetry personally reviewed and also discussed with central telemetry as well. ? ?Hemoglobin A1c is very well controlled.  Continue sliding scale insulin. ? ?Fasting lipid profile reviewed, well controlled, continue current statin therapy. ? ?This note was created using a voice  recognition software as a result there may be grammatical errors inadvertently enclosed that do not reflect the nature of this encounter. Every attempt is made to correct such errors. ? ?Total time spent: 25 minutes. ? ?Stacey Kras, DO, FACC ? ?Pager: [phone] ?Office: [phone] ?10/12/2021, 12:38 PM   ? ? ?

## 2021-10-12 NOTE — Progress Notes (Signed)
Patient called and felt "funny" stated that her muscles were hurting in her arms. Patient had just eaten lunch and moved from chair to bed. HR 62 when I entered room. Assisted pt to lying position and checked VS and got EKG. Pt returned to her normal state while getting EKG. Called Dr. Odis Hollingshead and made aware. Pt felt nauseous and will give zofran as well. Pt resting with call bell within reach.  Will continue to monitor. ?Thomas Hoff, RN ? ?

## 2021-10-13 ENCOUNTER — Encounter (HOSPITAL_COMMUNITY)
Admission: AD | Disposition: A | Discharge status: Home Health | Payer: Self-pay | Source: Ambulatory Visit | Attending: Cardiology

## 2021-10-13 DIAGNOSIS — I495 Sick sinus syndrome: Principal | ICD-10-CM

## 2021-10-13 DIAGNOSIS — R001 Bradycardia, unspecified: Secondary | ICD-10-CM | POA: Diagnosis not present

## 2021-10-13 HISTORY — PX: PACEMAKER IMPLANT: EP1218

## 2021-10-13 LAB — BASIC METABOLIC PANEL
Anion gap: 3 — ABNORMAL LOW (ref 5–15)
BUN: 11 mg/dL (ref 8–23)
CO2: 27 mmol/L (ref 22–32)
Calcium: 8.3 mg/dL — ABNORMAL LOW (ref 8.9–10.3)
Chloride: 107 mmol/L (ref 98–111)
Creatinine, Ser: 1.01 mg/dL — ABNORMAL HIGH (ref 0.44–1.00)
GFR, Estimated: >60 mL/min (ref >60)
Glucose, Bld: 114 mg/dL — ABNORMAL HIGH (ref 70–99)
Potassium: 4.3 mmol/L (ref 3.5–5.1)
Sodium: 137 mmol/L (ref 135–145)

## 2021-10-13 LAB — GLUCOSE, CAPILLARY
Glucose-Capillary: 111 mg/dL — ABNORMAL HIGH (ref 70–99)
Glucose-Capillary: 101 mg/dL — ABNORMAL HIGH (ref 70–99)
Glucose-Capillary: 101 mg/dL — ABNORMAL HIGH (ref 70–99)
Glucose-Capillary: 124 mg/dL — ABNORMAL HIGH (ref 70–99)

## 2021-10-13 LAB — CBC
HCT: 36.9 % (ref 36.0–46.0)
Hemoglobin: 12.4 g/dL (ref 12.0–15.0)
MCH: 34.3 pg — ABNORMAL HIGH (ref 26.0–34.0)
MCHC: 33.6 g/dL (ref 30.0–36.0)
MCV: 102.2 fL — ABNORMAL HIGH (ref 80.0–100.0)
Platelets: 106 10*3/uL — ABNORMAL LOW (ref 150–400)
RBC: 3.61 MIL/uL — ABNORMAL LOW (ref 3.87–5.11)
RDW: 19.8 % — ABNORMAL HIGH (ref 11.5–15.5)
WBC: 3.4 10*3/uL — ABNORMAL LOW (ref 4.0–10.5)
nRBC: 0 % (ref 0.0–0.2)

## 2021-10-13 LAB — SURGICAL PCR SCREEN
MRSA, PCR: NEGATIVE
Staphylococcus aureus: NEGATIVE

## 2021-10-13 SURGERY — PACEMAKER IMPLANT

## 2021-10-13 MED ORDER — FENTANYL CITRATE (PF) 100 MCG/2ML IJ SOLN
INTRAMUSCULAR | Status: DC | PRN
  Administered 2021-10-13: 12.5 ug via INTRAVENOUS
  Administered 2021-10-13 (×2): 25 ug via INTRAVENOUS
  Administered 2021-10-13: 12.5 ug via INTRAVENOUS

## 2021-10-13 MED ORDER — SODIUM CHLORIDE 0.9 % IV SOLN: 250.0000 mL | INTRAVENOUS | Status: DC

## 2021-10-13 MED ORDER — SODIUM CHLORIDE 0.9 % IV SOLN
INTRAVENOUS | Status: AC
  Filled 2021-10-13: qty 2

## 2021-10-13 MED ORDER — MIDAZOLAM HCL 5 MG/5ML IJ SOLN
INTRAMUSCULAR | Status: AC
  Filled 2021-10-13: qty 5

## 2021-10-13 MED ORDER — HEPARIN (PORCINE) IN NACL 1000-0.9 UT/500ML-% IV SOLN
INTRAVENOUS | Status: AC
  Filled 2021-10-13: qty 500

## 2021-10-13 MED ORDER — LOPERAMIDE HCL 2 MG PO CAPS
2.0000 mg | ORAL_CAPSULE | ORAL | Status: DC | PRN
  Administered 2021-10-13: 2 mg via ORAL
  Filled 2021-10-13: qty 1

## 2021-10-13 MED ORDER — ACETAMINOPHEN 325 MG PO TABS
325.0000 mg | ORAL_TABLET | ORAL | Status: DC | PRN
  Administered 2021-10-13 – 2021-10-14 (×4): 650 mg via ORAL
  Filled 2021-10-13 (×4): qty 2

## 2021-10-13 MED ORDER — HEPARIN (PORCINE) IN NACL 1000-0.9 UT/500ML-% IV SOLN
INTRAVENOUS | Status: DC | PRN
  Administered 2021-10-13: 500 mL

## 2021-10-13 MED ORDER — LIDOCAINE HCL (PF) 1 % IJ SOLN
INTRAMUSCULAR | Status: DC | PRN
Start: 2021-10-13 — End: 2021-10-13
  Administered 2021-10-13: 60 mL

## 2021-10-13 MED ORDER — LIDOCAINE HCL 1 % IJ SOLN
INTRAMUSCULAR | Status: AC
  Filled 2021-10-13: qty 60

## 2021-10-13 MED ORDER — AZATHIOPRINE 50 MG PO TABS
175.0000 mg | ORAL_TABLET | Freq: Every day | ORAL | Status: DC
  Administered 2021-10-13: 175 mg via ORAL
  Filled 2021-10-13 (×2): qty 4

## 2021-10-13 MED ORDER — FENTANYL CITRATE (PF) 100 MCG/2ML IJ SOLN
INTRAMUSCULAR | Status: AC
Start: 2021-10-13 — End: ?
  Filled 2021-10-13: qty 2

## 2021-10-13 MED ORDER — MIDAZOLAM HCL 5 MG/5ML IJ SOLN
INTRAMUSCULAR | Status: DC | PRN
  Administered 2021-10-13: .5 mg via INTRAVENOUS
  Administered 2021-10-13 (×2): 1 mg via INTRAVENOUS
  Administered 2021-10-13: .5 mg via INTRAVENOUS

## 2021-10-13 SURGICAL SUPPLY — 12 items
CABLE SURGICAL S-101-97-12 (CABLE) ×2 IMPLANT
KIT ACCESSORY SELECTRA FIX CVD (MISCELLANEOUS) ×1 IMPLANT
LEAD SELECTRA 3D-55-42 (CATHETERS) ×1 IMPLANT
LEAD SOLIA S PRO MRI 53 (Lead) ×1 IMPLANT
LEAD SOLIA S PRO MRI 60 (Lead) ×1 IMPLANT
MAT PREVALON FULL STRYKER (MISCELLANEOUS) ×1 IMPLANT
PACEMAKER EDORA 8DR-T MRI (Pacemaker) ×1 IMPLANT
PAD DEFIB RADIO PHYSIO CONN (PAD) ×2 IMPLANT
SHEATH 7FR PRELUDE SNAP 13 (SHEATH) ×1 IMPLANT
SHEATH 9FR PRELUDE SNAP 13 (SHEATH) ×1 IMPLANT
SHEATH PROBE COVER 6X72 (BAG) ×1 IMPLANT
TRAY PACEMAKER INSERTION (PACKS) ×2 IMPLANT

## 2021-10-13 NOTE — Progress Notes (Signed)
Progress Note ? ?[name] ?Date of Encounter: 10/13/2021 ? ?Attending physician: Rex Kras, DO ?Primary care provider: Andria Frames, PA-C ? ?Subjective: ?Stacey Burns is a 66 y.o. female who was seen and examined at bedside  ?Continues to feel tired, fatigue, shortness of breath. ?Remains in sinus rhythm overall with intermittent junctional. ?No syncope ?Telemetry reviewed ? ?Objective: ?Vital Signs in the last 24 hours: ?Temp:  [98 ?F (36.7 ?C)-98.5 ?F (36.9 ?C)] 98.2 ?F (36.8 ?C) (05/01 1627) ?Pulse Rate:  [43-54] 54 (05/01 1627) ?Resp:  [15-17] 17 (05/01 1627) ?BP: (112-161)/(49-67) 113/49 (05/01 1627) ?SpO2:  [94 %-100 %] 94 % (05/01 1714) ? ?Intake/Output: ? ?Intake/Output Summary (Last 24 hours) at 10/13/2021 1759 ?Last data filed at 10/12/2021 1945 ?Gross per 24 hour  ?Intake 360 ml  ?Output --  ?Net 360 ml  ?  ?Net IO Since Admission: 2,366 mL [10/13/21 1759] ? ?Weights:  ?Filed Weights  ? 10/10/21 0500 10/11/21 0401 10/12/21 0453  ?Weight: 114.1 kg 112.3 kg 114.2 kg  ? ? ?Telemetry: Personally reviewed. SB w/ intermittent junctional rhythm. ? ?Physical examination: ?PHYSICAL EXAM: ? ?  10/13/2021  ?  4:27 PM 10/13/2021  ? 11:35 AM 10/13/2021  ?  7:33 AM  ?Vitals with BMI  ?Systolic [id number]  ?Diastolic 49 63 62  ?Pulse 54 46 47  ? ? ?General: Appears older than stated age, hemodynamically stable, ambulates with a cane, no acute distress. ?HEENT: Normocephalic, atraumatic, no scleral icterus or xanthelasmas, no JVP, no carotid bruits. ?Heart: Bradycardic, regular, positive S1-S2, no murmurs rubs or gallops appreciated. ?Chest: Clear to auscultation bilaterally, equal rise and fall of chest cavity, no wheezes rales or rhonchi's. ?Abdomen: Is obese, soft, nontender, nondistended, positive bowel sounds in all 4 quadrants. ?Extremities: No pitting edema, warm to touch, discoid rash present.  SCDs present ?Neuro: Cranial nerves II to XII are grossly intact, nonfocal, normal  muscle tone. ?No change in physical examination since yesterday. ? ?Lab Results: ?Hematology ?Recent Labs  ?Lab 10/11/21 ?0102 10/12/21 ?0330 10/13/21 ?0612  ?WBC 4.4 3.8* 3.4*  ?RBC 3.55* 3.66* 3.61*  ?HGB 12.2 12.6 12.4  ?HCT 35.4* 37.2 36.9  ?MCV 99.7 101.6* 102.2*  ?MCH 34.4* 34.4* 34.3*  ?MCHC 34.5 33.9 33.6  ?RDW 19.6* 19.8* 19.8*  ?PLT 98* 107* 106*  ? ? ?Chemistry ?Recent Labs  ?Lab 10/08/21 ?1816 10/10/21 ?[id number] 10/11/21 ?0102 10/12/21 ?0330 10/13/21 ?0612  ?NA 139   < > 139 138 137  ?K 4.5   < > 4.2 4.0 4.3  ?CL 110   < > 111 108 107  ?CO2 25   < > 25 26 27   ?GLUCOSE 93   < > 120* 123* 114*  ?BUN 14   < > 14 13 11   ?CREATININE 1.07*   < > 1.10* 1.05* 1.01*  ?CALCIUM 8.7*   < > 8.0* 8.1* 8.3*  ?PROT 7.4  --   --   --   --   ?ALBUMIN 2.6*  --   --   --   --   ?AST 96*  --   --   --   --   ?ALT 58*  --   --   --   --   ?ALKPHOS 126  --   --   --   --   ?BILITOT 1.1  --   --   --   --   ?GFRNONAA 58*   < > 55* 59* >60  ?ANIONGAP 4*   < >  3* 4* 3*  ? < > = values in this interval not displayed.  ?  ? ?Cardiac Enzymes: ?Cardiac Panel (last 3 results) ?No results for input(s): CKTOTAL, CKMB, TROPONINIHS, RELINDX in the last 72 hours. ? ?BNP (last 3 results) ?Recent Labs  ?  10/08/21 ?1816  ?BNP 211.0*  ? ? ?ProBNP (last 3 results) ?No results for input(s): PROBNP in the last 8760 hours. ? ? ?DDimer No results for input(s): DDIMER in the last 168 hours.  ? ?Hemoglobin A1c:  ?Lab Results  ?Component Value Date  ? HGBA1C 6.6 (H) 10/08/2021  ? MPG 142.72 10/08/2021  ? ? ?TSH  ?Recent Labs  ?  10/08/21 ?1816  ?TSH 1.216  ? ? ?Lipid Panel  ?   ?Component Value Date/Time  ? CHOL 66 10/09/2021 0148  ? TRIG 12 10/09/2021 0148  ? HDL 30 (L) 10/09/2021 0148  ? CHOLHDL 2.2 10/09/2021 0148  ? VLDL 2 10/09/2021 0148  ? [place] 34 10/09/2021 0148  ? LDLDIRECT 24.3 10/09/2021 0148  ? ? ?Imaging: ?No results found. ? ?CARDIAC DATABASE: ?EKG: ?September 15, 2021: Sinus bradycardia, 42 bpm, nonspecific T wave abnormality, without  underlying injury pattern. ?October 08, 2021: Sinus bradycardia, 48 bpm, without underlying ischemia or injury pattern. ?October 09, 2021: Sinus rhythm with junctional escape 43 bpm. ?  ?Echocardiogram: ?08/28/2021 at [facility]: ?Per report LVEF 60-65%, mildly dilated left atrium, trace MR, mild TR, mild pulmonary hypertension 41 mmHg, no pericardial effusion ? ?Scheduled Meds: ? [MAR Hold] atorvastatin  80 mg Oral Daily  ? [MAR Hold] azaTHIOprine  175 mg Oral Q1200  ? [MAR Hold] fluticasone furoate-vilanterol  1 puff Inhalation Daily  ? And  ? [MAR Hold] umeclidinium bromide  1 puff Inhalation Daily  ? gentamicin irrigation  80 mg Irrigation On Call  ? [MAR Hold] insulin aspart  0-20 Units Subcutaneous TID WC  ? [MAR Hold] lisinopril  40 mg Oral Daily  ? [MAR Hold] sodium chloride flush  3 mL Intravenous Q12H  ? ? ?Continuous Infusions: ? sodium chloride 50 mL/hr at 10/13/21 0646  ? sodium chloride    ? vancomycin 1,500 mg (10/13/21 1724)  ? ? ?PRN Meds: ?[MAR Hold] albuterol, fentaNYL, Heparin (Porcine) in NaCl, [MAR Hold] hydrALAZINE, lidocaine (PF), [MAR Hold] loperamide, midazolam, [MAR Hold] ondansetron (ZOFRAN) IV, [MAR Hold] ondansetron, sodium chloride flush  ? ?IMPRESSION & RECOMMENDATIONS: ?Stacey Burns is a 66 y.o. African-American female whose past medical history and cardiac risk factors include: Paroxysmal atrial fibrillation, history of direct-current cardioversion, insulin-dependent diabetes mellitus type 2, hyperlipidemia, hypertension, aortic atherosclerosis, history of CAD (per patient), sleep apnea not on CPAP, history of stroke (per EMR), lupus, former smoker, history of COVID-19 infection, postmenopausal woman, obesity due to excess calories. ? ?Impression: ?Symptomatic bradycardia w/ intermittent junctional escape  ?Sinus node dysfunction. ?Sinus pause. ?Paroxysmal atrial fibrillation. ?Long-term oral anticoagulation. ?History of direct-current  cardioversion ?Insulin-dependent diabetes mellitus type 2. ?Hypertension. ?Hyperlipidemia. ?Aortic atherosclerosis. ?History of sleep apnea not on CPAP. ?Lupus. ?Former smoker. ?History of COVID-19 infection. ? ?Recommendations: ?Electively admitted to the hospital for symptomatic bradycardia due to sinus node dysfunction.  On the cardiac monitor as outpatient she was noted to have sinus pauses longest being 4.4 seconds during the daytime. ? ?At the last office visit patient was dosed appropriately for thromboembolic prophylaxis for A-fib.  However due to insurance difficulties she was changed to Xarelto by another provider and was prescribed a dose for DVT/PE treatment and therefore cardiac electrophysiology  team recommended postponing pacemaker implantation until today. ? ?Cardiac telemetry independently reviewed.  Predominately sinus bradycardia with intermittent junctional rhythm.  She had a pause greater than 3 seconds yesterday 10/12/2021. ? ?Will need guidance from EP after the PPM implant w/ regards to intiating [place] keeping in mind her risk (Lupus, PAFib, etc).  ? ?Her telemetry personally reviewed and also discussed with central telemetry as well. ? ?Hemoglobin A1c is very well controlled.  Continue sliding scale insulin. ? ?Fasting lipid profile reviewed, well controlled, continue current statin therapy. ? ?Physical therapy recommended home health care.  Orders placed and discussed with case manager. ? ?This note was created using a voice recognition software as a result there may be grammatical errors inadvertently enclosed that do not reflect the nature of this encounter. Every attempt is made to correct such errors. ? ?Total time spent: 25 minutes. ? ?Rex Kras, DO, FACC ? ?Pager: [phone] ?Office: [phone] ?10/13/2021, 5:59 PM   ? ? ?

## 2021-10-13 NOTE — Progress Notes (Signed)
Mobility Specialist Progress Note ? ? 10/13/21 1125  ?Mobility  ?Activity Ambulated with assistance to bathroom  ?Level of Assistance Standby assist, set-up cues, supervision of patient - no hands on  ?Assistive Device Four point cane  ?Distance Ambulated (ft) 16 ft  ?Activity Response Tolerated well  ?$Mobility charge 1 Mobility  ? ?Post Mobility: 58 HR, 90% SpO2 ? ?Received pt at EOB requesting assistance to BR. Required elevation from bed to stand up on own but no physical assist needed afterwards. Successful void, returned back to bed w/o fault and call bell in reach.  ? ?Frederico Hamman ?Mobility Specialist ?Phone Number [phone] ? ?

## 2021-10-13 NOTE — Progress Notes (Addendum)
? ?Electrophysiology Rounding Note ? ?[name] ?Date of Encounter: 10/13/2021 ? ?Primary Cardiologist: [facility] -> Dr. Terri Skains.  ?Electrophysiologist: New to Dr. Quentin Ore ? ? ?Subjective  ? ?Feeling OK this am.  ? ?Inpatient Medications  ?  ?Scheduled Meds: ? atorvastatin  80 mg Oral Daily  ? azaTHIOprine  175 mg Oral Q1200  ? fluticasone furoate-vilanterol  1 puff Inhalation Daily  ? And  ? umeclidinium bromide  1 puff Inhalation Daily  ? gentamicin irrigation  80 mg Irrigation On Call  ? insulin aspart  0-20 Units Subcutaneous TID WC  ? lisinopril  40 mg Oral Daily  ? sodium chloride flush  3 mL Intravenous Q12H  ? ?Continuous Infusions: ? sodium chloride 50 mL/hr at 10/13/21 0646  ? sodium chloride    ? vancomycin    ? ?PRN Meds: ?albuterol, hydrALAZINE, ondansetron (ZOFRAN) IV, ondansetron, sodium chloride flush  ? ?Vital Signs  ?  ?Vitals:  ? 10/12/21 2252 10/13/21 0007 10/13/21 0405 10/13/21 [id number]  ?BP: (!) 159/63 (!) 161/67 (!) 146/52 (!) 157/62  ?Pulse: (!) 43 (!) 47 (!) 47 (!) 47  ?Resp: 15 16 16 15   ?Temp: 98.5 ?F (36.9 ?C) 98.1 ?F (36.7 ?C) 98.2 ?F (36.8 ?C) 98 ?F (36.7 ?C)  ?TempSrc: Oral Oral Oral Oral  ?SpO2: 97% 98% 98% 99%  ?Weight:      ?Height:      ? ? ?Intake/Output Summary (Last 24 hours) at 10/13/2021 0756 ?Last data filed at 10/12/2021 1945 ?Gross per 24 hour  ?Intake 840 ml  ?Output --  ?Net 840 ml  ? ?Filed Weights  ? 10/10/21 0500 10/11/21 0401 10/12/21 0453  ?Weight: 114.1 kg 112.3 kg 114.2 kg  ? ? ?Physical Exam  ?  ?GEN- The patient is well appearing, alert and oriented x 3 today.   ?Head- normocephalic, atraumatic ?Eyes-  Sclera clear, conjunctiva pink ?Ears- hearing intact ?Oropharynx- clear ?Neck- supple ?Lungs- Clear to ausculation bilaterally, normal work of breathing ?Heart-  slow but regular  rate and rhythm, no murmurs, rubs or gallops ?GI- soft, NT, ND, + BS ?Extremities- no clubbing or cyanosis. No edema ?Skin- no rash or lesion ?Psych- euthymic mood, full  affect ?Neuro- strength and sensation are intact ? ?Labs  ?  ?CBC ?Recent Labs  ?  10/12/21 ?0330 10/13/21 ?0612  ?WBC 3.8* 3.4*  ?HGB 12.6 12.4  ?HCT 37.2 36.9  ?MCV 101.6* 102.2*  ?PLT 107* 106*  ? ?Basic Metabolic Panel ?Recent Labs  ?  10/12/21 ?0330 10/13/21 ?0612  ?NA 138 137  ?K 4.0 4.3  ?CL 108 107  ?CO2 26 27  ?GLUCOSE 123* 114*  ?BUN 13 11  ?CREATININE 1.05* 1.01*  ?CALCIUM 8.1* 8.3*  ? ?Liver Function Tests ?No results for input(s): AST, ALT, ALKPHOS, BILITOT, PROT, ALBUMIN in the last 72 hours. ?No results for input(s): LIPASE, AMYLASE in the last 72 hours. ?Cardiac Enzymes ?No results for input(s): CKTOTAL, CKMB, CKMBINDEX, TROPONINI in the last 72 hours. ? ? ?Telemetry  ?  ?Sinus bradycardia 40s with daytime rates into 20-30s at times (personally reviewed) ? ?Radiology  ?  ?No results found. ? ?Patient Profile  ?   ?Stacey Burns is a pleasant 66yo woman who I am seeing today for HTN, HLD, DM, obesity, OSA on CPAP, AF, lupus nephritis, COPD who I am seeing today for bradycardia at the request of Dr Terri Skains. ?She had a prior AF ablation in [place] in 2018. Amiodarone has been used for years. ?She has  fallen recently without a clear cause. She also reports symptoms of fatigue, lightheadedness, dyspnea with exertion for the past year. ?For her AF she has been previously prescribed twice daily rivaroxaban. ? ?Assessment & Plan  ?  ?Sinus bradycardia ?Sinus node dysfunction ? ?Explained risks, benefits, and alternatives to PPM implantation, including but not limited to bleeding, infection, pneumothorax, pericardial effusion, lead dislodgement, heart attack, stroke, or death.  Pt verbalized understanding and agrees to proceed today pending lab availability.  ? ? ?For questions or updates, please contact [place] ?Please consult [email or web address] for contact info under Cardiology/STEMI. ? ?Signed, ?Shirley Friar, PA-C  ?10/13/2021, 7:56 AM  ? ?

## 2021-10-13 NOTE — TOC Initial Note (Signed)
Transition of Care (TOC) - Initial/Assessment Note  ?Marvetta Gibbons Therapist, sports, BSN ?Transitions of Care ?Unit 4E- RN Case Manager ?See Treatment Team for direct phone #  ? ? ?Patient Details  ?Name: Stacey Burns ?MRN: [id number] ?Date of Birth: 20-Sep-1955 ? ?Transition of Care (TOC) CM/SW Contact:    ?Dahlia Client, Romeo Rabon, RN ?Phone Number: ?10/13/2021, 2:41 PM ? ?Clinical Narrative:                 ?Pt admitted from home, s/p PPM today.  ?Order placed for HHPT, CM in to speak with pt at bedside- daughter also present.  ?Per conversation with pt- pt reports she is already active with HHPT prior to admit. She would like to continue with therapy at home and wishes to stay with current agency- [facility].  ?Pt also shares she has personal care benefit with her [facility] Medicare but has been unable to find agency that will accept insurance benefit for services- in home care for personal needs. Discussed with patient difference between [facility] "skilled" services that are covered w/ her Medicare and agencies that will service verses the 30 hours/per week for personal care needs that she has with her insurance plan. Provided patient and her daughter with some examples of how to research for the agencies in her area that may can assist and use her insurance for this type of service. Pt and daughter to look further into this benefit and see if they can find an agency to service her, also encouraged her to reach out to her insurance as well as they may have a list of network providers for the in home services.  ? ?Pt voiced that she has needed DME - rollator and cane- no new DME needs at this time.  ? ?Family to provide transport home.  ? ?Call made to [facility] with [place] services confirmed and they will resume on discharge.  ? ?Expected Discharge Plan: [place] ?Barriers to Discharge: No Barriers Identified ? ? ?Patient Goals and CMS Choice ?Patient states their goals for this hospitalization  and ongoing recovery are:: return home w/ family and continue therapy ?CMS Medicare.gov Compare Post Acute Care list provided to:: Patient ?Choice offered to / list presented to : Patient ? ?Expected Discharge Plan and Services ?Expected Discharge Plan: [place] ?  ?Discharge Planning Services: CM Consult ?Post Acute Care Choice: Home Health, Resumption of Svcs/PTA Provider ?Living arrangements for the past 2 months: [place] ?                ?DME Arranged: N/A ?DME Agency: NA ?  ?  ?  ?  ?[place] Agency: [place] ([place]) ?Date HH Agency Contacted: 10/13/21 ?Time [place]: [phone] spoke with at [place]: Ramond Marrow ? ?Prior Living Arrangements/Services ?Living arrangements for the past 2 months: [place] ?Lives with:: Self, Relatives ?Patient language and need for interpreter reviewed:: Yes ?Do you feel safe going back to the place where you live?: Yes      ?Need for Family Participation in Patient Care: Yes (Comment) ?Care giver support system in place?: Yes (comment) ?Current home services: DME (Rollator, cane) ?Criminal Activity/Legal Involvement Pertinent to Current Situation/Hospitalization: No - Comment as needed ? ?Activities of Daily Living ?  ?  ? ?Permission Sought/Granted ?Permission sought to share information with : Customer service manager ?Permission granted to share information with : Yes, Verbal Permission Granted ?   ? Permission granted  to share info w AGENCY: [place] ?   ?   ? ?Emotional Assessment ?Appearance:: Appears stated age ?Attitude/Demeanor/Rapport: Engaged ?Affect (typically observed): Accepting, Appropriate, Pleasant ?Orientation: : Oriented to Self, Oriented to Place, Oriented to  Time, Oriented to Situation ?Alcohol / Substance Use: Not Applicable ?Psych Involvement: No (comment) ? ?Admission diagnosis:  Symptomatic sinus bradycardia [R00.1] ?Patient Active Problem List  ? Diagnosis Date Noted  ? Symptomatic  bradycardia 10/08/2021  ? Sinus pause 10/08/2021  ? Symptomatic sinus bradycardia 10/08/2021  ? Acute respiratory failure with hypoxia ([place]) 01/16/2019  ? Pneumonia due to COVID-19 virus 01/15/2019  ? CPAP (continuous positive airway pressure) dependence   ? Coronary artery disease   ? Asthma   ? Hypertension   ? Paroxysmal atrial fibrillation (HCC)   ? Stroke [facility])   ? Encounter for medication refill 07/27/2018  ? Type 2 diabetes mellitus ([place]) 07/27/2018  ? Essential hypertension 07/27/2018  ? Lupus ([place]) 07/27/2018  ? Clotting disorder ([place]) 07/27/2018  ? Hyperlipidemia 07/27/2018  ? ?PCP:  Andria Frames, PA-C ?Pharmacy:   ?[facility] [address] ?[place] ?[place] [address] ?Phone: [phone] Fax: [phone] ? ?[id number] - [place] ?[place] ?[place] [address] ?Phone: [phone] Fax: [phone] ? ? ? ? ?Social Determinants of Health (SDOH) Interventions ?  ? ?Readmission Risk Interventions ? ?  10/13/2021  ?  2:41 PM  ?Readmission Risk Prevention Plan  ?Post Dischage Appt Complete  ?Medication Screening Complete  ?Transportation Screening Complete  ? ? ? ?

## 2021-10-13 NOTE — Progress Notes (Signed)
VAST consulted to obtain IV access X2 for procedure later today. Assessed bilateral arms utilizing ultrasound; patient's vessels are extremely small. Able to obtain 22G IV in patient's left anterior forearm. Patient without appropriate vessels for access in right forearm. Pt refused IV placement in upper right arm. Unit nurse notified.  ?

## 2021-10-14 ENCOUNTER — Inpatient Hospital Stay (HOSPITAL_COMMUNITY): Payer: Medicare Other

## 2021-10-14 ENCOUNTER — Encounter (HOSPITAL_COMMUNITY): Payer: Self-pay | Admitting: Cardiology

## 2021-10-14 DIAGNOSIS — Z7901 Long term (current) use of anticoagulants: Secondary | ICD-10-CM

## 2021-10-14 DIAGNOSIS — I700 Atherosclerosis of aorta: Secondary | ICD-10-CM

## 2021-10-14 DIAGNOSIS — I10 Essential (primary) hypertension: Secondary | ICD-10-CM

## 2021-10-14 DIAGNOSIS — I495 Sick sinus syndrome: Secondary | ICD-10-CM

## 2021-10-14 LAB — BASIC METABOLIC PANEL
Anion gap: 6 (ref 5–15)
BUN: 15 mg/dL (ref 8–23)
CO2: 23 mmol/L (ref 22–32)
Calcium: 7.9 mg/dL — ABNORMAL LOW (ref 8.9–10.3)
Chloride: 105 mmol/L (ref 98–111)
Creatinine, Ser: 0.98 mg/dL (ref 0.44–1.00)
GFR, Estimated: >60 mL/min (ref >60)
Glucose, Bld: 192 mg/dL — ABNORMAL HIGH (ref 70–99)
Potassium: 4.3 mmol/L (ref 3.5–5.1)
Sodium: 134 mmol/L — ABNORMAL LOW (ref 135–145)

## 2021-10-14 LAB — CBC
HCT: 35.8 % — ABNORMAL LOW (ref 36.0–46.0)
Hemoglobin: 12 g/dL (ref 12.0–15.0)
MCH: 34.1 pg — ABNORMAL HIGH (ref 26.0–34.0)
MCHC: 33.5 g/dL (ref 30.0–36.0)
MCV: 101.7 fL — ABNORMAL HIGH (ref 80.0–100.0)
Platelets: 108 10*3/uL — ABNORMAL LOW (ref 150–400)
RBC: 3.52 MIL/uL — ABNORMAL LOW (ref 3.87–5.11)
RDW: 19.8 % — ABNORMAL HIGH (ref 11.5–15.5)
WBC: 3.8 10*3/uL — ABNORMAL LOW (ref 4.0–10.5)
nRBC: 0 % (ref 0.0–0.2)

## 2021-10-14 LAB — GLUCOSE, CAPILLARY: Glucose-Capillary: 150 mg/dL — ABNORMAL HIGH (ref 70–99)

## 2021-10-14 MED ORDER — RIVAROXABAN 20 MG PO TABS: 20.0000 mg | ORAL_TABLET | Freq: Every day | ORAL | 0 refills | Status: AC

## 2021-10-14 NOTE — Progress Notes (Addendum)
? ?Electrophysiology Rounding Note ? ?[name] ?Date of Encounter: 10/14/2021 ? ?Primary Cardiologist: Dr. Terri Skains ?Electrophysiologist: Dr. Quentin Ore ? ? ?Subjective  ? ?The patient is doing well today.  At this time, the patient denies chest pain, shortness of breath, or any new concerns. ? ?Inpatient Medications  ?  ?Scheduled Meds: ? atorvastatin  80 mg Oral Daily  ? azaTHIOprine  175 mg Oral Q1200  ? fluticasone furoate-vilanterol  1 puff Inhalation Daily  ? And  ? umeclidinium bromide  1 puff Inhalation Daily  ? insulin aspart  0-20 Units Subcutaneous TID WC  ? lisinopril  40 mg Oral Daily  ? sodium chloride flush  3 mL Intravenous Q12H  ? ?Continuous Infusions: ? ?PRN Meds: ?acetaminophen, albuterol, hydrALAZINE, loperamide, ondansetron (ZOFRAN) IV, ondansetron  ? ?Vital Signs  ?  ?Vitals:  ? 10/13/21 1836 10/13/21 2048 10/14/21 0001 10/14/21 0314  ?BP: (!) 152/68 (!) 162/74 (!) 121/55 134/62  ?Pulse: 70 68 70 78  ?Resp:  18 18 20   ?Temp:  97.7 ?F (36.5 ?C) 98.3 ?F (36.8 ?C) 98.5 ?F (36.9 ?C)  ?TempSrc:  Oral Oral Oral  ?SpO2: 99% 100% 98% 99%  ?Weight:      ?Height:      ? ?No intake or output data in the 24 hours ending 10/14/21 [id number] ?Filed Weights  ? 10/10/21 0500 10/11/21 0401 10/12/21 0453  ?Weight: 114.1 kg 112.3 kg 114.2 kg  ? ? ?Physical Exam  ?  ?GEN- The patient is well appearing, alert and oriented x 3 today.   ?Head- normocephalic, atraumatic ?Eyes-  Sclera clear, conjunctiva pink ?Ears- hearing intact ?Oropharynx- clear ?Neck- supple ?Lungs- Clear to ausculation bilaterally, normal work of breathing ?Heart- Regular rate and rhythm, no murmurs, rubs or gallops ?GI- soft, NT, ND, + BS ?Extremities- no clubbing or cyanosis. No edema ?Skin- no rash or lesion ?Psych- euthymic mood, full affect ?Neuro- strength and sensation are intact ? ?Labs  ?  ?CBC ?Recent Labs  ?  10/13/21 ?0612 10/14/21 ?0559  ?WBC 3.4* 3.8*  ?HGB 12.4 12.0  ?HCT 36.9 35.8*  ?MCV 102.2* 101.7*  ?PLT 106* 108*   ? ?Basic Metabolic Panel ?Recent Labs  ?  10/13/21 ?0612 10/14/21 ?0559  ?NA 137 134*  ?K 4.3 4.3  ?CL 107 105  ?CO2 27 23  ?GLUCOSE 114* 192*  ?BUN 11 15  ?CREATININE 1.01* 0.98  ?CALCIUM 8.3* 7.9*  ? ?Liver Function Tests ?No results for input(s): AST, ALT, ALKPHOS, BILITOT, PROT, ALBUMIN in the last 72 hours. ?No results for input(s): LIPASE, AMYLASE in the last 72 hours. ?Cardiac Enzymes ?No results for input(s): CKTOTAL, CKMB, CKMBINDEX, TROPONINI in the last 72 hours. ? ? ?Telemetry  ?  ?AP-VS at 65 (personally reviewed) ? ?Radiology  ?  ?EP PPM/ICD IMPLANT ? ?Result Date: 10/13/2021 ? CONCLUSIONS:  1. Symptomatic bradycardia and sinus node dysfunction  2. Successful dual chamber permanent pacemaker implantation  3.  No early apparent complications.   ? ?Patient Profile  ?   ?[name] is a pleasant 66yo woman who I am seeing today for HTN, HLD, DM, obesity, OSA on CPAP, AF, lupus nephritis, COPD who I am seeing today for bradycardia at the request of Dr Terri Skains. ?She had a prior AF ablation in [place] in 2018. Amiodarone has been used for years. ?She has fallen recently without a clear cause. She also reports symptoms of fatigue, lightheadedness, dyspnea with exertion for the past year. ?For her AF she has been previously  prescribed twice daily rivaroxaban. ? ?Assessment & Plan  ?  ?Sinus bradycardia ?Sinus node dysfunction ?S/p Biotronik DDR PPM  ?CXR stable ?Interrogation stable.  ?Usual follow up scheduled and wound care reviewed with patient.  ?Hold [place] for FIVE DAYS. Resume Saturday night.  ? ?Dr. Quentin Ore has seen. [place] for d/c from EP perspective.  ? ?For questions or updates, please contact [place] ?Please consult [email or web address] for contact info under Cardiology/STEMI. ? ?Signed, ?Shirley Friar, PA-C  ?10/14/2021, 7:12 AM  ? ?

## 2021-10-14 NOTE — Discharge Instructions (Addendum)
After Your Pacemaker ? ? ?You have a Biotronik Pacemaker ? ?ACTIVITY ?Do not lift your arm above shoulder height for 1 week after your procedure. After 7 days, you may progress as below.  ?You should remove your sling 24 hours after your procedure, unless otherwise instructed by your provider.  ? ? ? Tuesday Oct 21, 2021  Wednesday Oct 22, 2021 Thursday Oct 23, 2021 Friday Oct 24, 2021  ? ?Do not lift, push, pull, or carry anything over 10 pounds with the affected arm until 6 weeks (Tuesday November 25, 2021 ) after your procedure.  ? ?You may drive AFTER your wound check, unless you have been told otherwise by your provider.  ? ?Ask your healthcare provider when you can go back to work ? ? ?INCISION/Dressing ?Resume blood thinner after FIVE DAYS (Saturday evening) ? ?If large square, outer bandage is left in place, this can be removed after 24 hours from your procedure. Do not remove steri-strips or glue as below.  ? ?Monitor your Pacemaker site for redness, swelling, and drainage. Call the device clinic at [phone] if you experience these symptoms or fever/chills. ? ?If your incision is sealed with Steri-strips or staples, you may shower 10 days after your procedure or when told by your provider. Do not remove the steri-strips or let the shower hit directly on your site. You may wash around your site with soap and water.   ? ?If you were discharged in a sling, please do not wear this during the day more than 48 hours after your surgery unless otherwise instructed. This may increase the risk of stiffness and soreness in your shoulder.  ? ?Avoid lotions, ointments, or perfumes over your incision until it is well-healed. ? ?You may use a hot tub or a pool AFTER your wound check appointment if the incision is completely closed. ? ?Pacemaker Alerts:  Some alerts are vibratory and others beep. These are NOT emergencies. Please call our office to let us know. If this occurs at night or on weekends, it can wait until the  next business day. Send a remote transmission. ? ?If your device is capable of reading fluid status (for heart failure), you will be offered monthly monitoring to review this with you.  ? ?DEVICE MANAGEMENT ?Remote monitoring is used to monitor your pacemaker from home. This monitoring is scheduled every 91 days by our office. It allows [place] to keep an eye on the functioning of your device to ensure it is working properly. You will routinely see your Electrophysiologist annually (more often if necessary).  ? ?You should receive your ID card for your new device in 4-8 weeks. Keep this card with you at all times once received. Consider wearing a medical alert bracelet or necklace. ? ?Your Pacemaker may be MRI compatible. This will be discussed at your next office visit/wound check.  You should avoid contact with strong electric or magnetic fields.  ? ?Do not use amateur (ham) radio equipment or electric (arc) welding torches. MP3 player headphones with magnets should not be used. Some devices are safe to use if held at least 12 inches (30 cm) from your Pacemaker. These include power tools, lawn mowers, and speakers. If you are unsure if something is safe to use, ask your health care provider. ? ?When using your cell phone, hold it to the ear that is on the opposite side from the Pacemaker. Do not leave your cell phone in a pocket over the Pacemaker. ? ?You may safely use  electric blankets, heating pads, computers, and microwave ovens. ? ?Call the office right away if: ?You have chest pain. ?You feel more short of breath than you have felt before. ?You feel more light-headed than you have felt before. ?Your incision starts to open up. ? ?This information is not intended to replace advice given to you by your health care provider. Make sure you discuss any questions you have with your health care provider.  ?

## 2021-10-14 NOTE — TOC Transition Note (Signed)
Transition of Care (TOC) - CM/SW Discharge Note ?Donn Pierini Charity fundraiser, BSN ?Transitions of Care ?Unit 4E- RN Case Manager ?See Treatment Team for direct phone #  ? ? ?Patient Details  ?Name: Stacey Burns ?MRN: [id number] ?Date of Birth: [birth year] ? ?Transition of Care (TOC) CM/SW Contact:  ?Zenda Alpers, Lenn Sink, RN ?Phone Number: ?10/14/2021, 2:08 PM ? ? ?Clinical Narrative:    ?Pt stable for transition home today, HHPT has been [facility] with [facility] following for restart of care.  ?No further TOC needs noted. Family to transport home.  ? ? ?Final next level of care: Home w Home Health Services ?Barriers to Discharge: No Barriers Identified ? ? ?Patient Goals and CMS Choice ?Patient states their goals for this hospitalization and ongoing recovery are:: return home w/ family and continue therapy ?CMS Medicare.gov Compare Post Acute Care list provided to:: Patient ?Choice offered to / list presented to : Patient ? ?Discharge Placement ?  ?           ?  ? Home w/ HH ?  ?  ? ?Discharge Plan and Services ?  ?Discharge Planning Services: CM Consult ?Post Acute Care Choice: Home Health, Resumption of Svcs/PTA Provider          ?DME Arranged: N/A ?DME Agency: NA ?  ?  ?  ?  ?HH Agency: [facility] ?Date HH Agency Contacted: 10/13/21 ?Time HH Agency Contacted: 1420 ?Representative spoke with at [facility] Agency: [facility] ? ?Social Determinants of Health (SDOH) Interventions ?  ? ? ?Readmission Risk Interventions ? ?  10/13/2021  ?  2:41 PM  ?Readmission Risk Prevention Plan  ?Post Dischage Appt Complete  ?Medication Screening Complete  ?Transportation Screening Complete  ? ? ? ? ? ?

## 2021-10-14 NOTE — Discharge Summary (Signed)
Physician Discharge Summary  ?Patient ID: ?[place] ?MRN: [id number] ?DOB/AGE: Feb 09, 1956 66 y.o. ? ?Admit date: 10/08/2021 ?Discharge date: 10/14/2021 ? ?Primary Discharge Diagnosis: ?Sinus node dysfunction. ?Symptomatic sinus bradycardia. ?Sinus pauses ? ?Secondary Discharge Diagnosis: ?Paroxysmal atrial fibrillation. ?Long-term oral anticoagulation. ?History of direct-current cardioversion. ?Insulin-dependent diabetes mellitus type 2. ?Hypertension.   ?Hyperlipidemia. ?Aortic atherosclerosis. ?History of sleep apnea on CPAP ?Lupus. ?Former smoker.   ?History of COVID-19 infection ? ?Procedures: ?Status post Biotronik DDDR permanent pacemaker implantation Oct 13, 2021 ? ?Hospital Course:  ? ?66 y.o. African-American female  with  Paroxysmal atrial fibrillation, history of direct-current cardioversion, insulin-dependent diabetes mellitus type 2, hyperlipidemia, hypertension, aortic atherosclerosis, history of CAD (per patient), sleep apnea not on CPAP, history of stroke (per EMR), lupus, former smoker, history of COVID-19 infection, postmenopausal woman, obesity due to excess calories. ? ?Establish care with the practice in April 2023 for management of atrial fibrillation and bradycardia.  Given her symptoms of bradycardia which appeared to be likely symptomatic she underwent a cardiac monitor which noted 488 episodes of pauses 3 seconds or longer and the longest pause was 4.4 seconds on September 19, 2021 at 9:17 AM.  Clinically patient has been experiencing tired, fatigued, drowsy throughout the day, short of breath with activity, and has had 4 falls since January 2020 through without a clear unifying diagnoses.  Given the clinical situation and objective findings patient was hospitalized for pacemaker evaluation as outpatient follow-up was not able to be set up urgently. ? ?Patient was hospitalized and no identifiable reversible causes noted.  Remain on telemetry.  And underwent pacemaker implantation on Oct 13, 2021 with Dr. Quentin Ore.  Patient has been evaluated from EP this morning and cleared for discharge.  They recommend holding oral anticoagulation for now and can start back on Saturday, Oct 18, 2021 in the evening.  This was conveyed to both the patient and her brother who is a physician over the phone. ? ? ?Discharge Exam: ?PHYSICAL EXAM: ? ?  10/14/2021  ? 11:37 AM 10/14/2021  ?  8:07 AM 10/14/2021  ?  3:14 AM  ?Vitals with BMI  ?Systolic A999333 A999333 [id number]  ?Diastolic 76 74 62  ?Pulse 78 77 78  ? ?CONSTITUTIONAL: Appears older than stated age, hemodynamically stable, ambulates with a cane, no acute distress.   ?HEENT: Normocephalic, atraumatic, no scleral icterus, no xanthelasmas, no JVP, no carotid bruit.   ?CHEST Normal respiratory effort. No intercostal retractions.  Dressing over the pacemaker site. ?LUNGS: Clear to auscultation bilaterally.  No stridor. No wheezes. No rales.  ?CARDIOVASCULAR: Regular, positive S1-S2, no murmurs rubs or gallops appreciated. ?ABDOMINAL: Obese, soft, nontender, nondistended, positive bowel sounds in all 4 quadrants no apparent ascites.  ?EXTREMITIES: No peripheral edema, warm to touch, discoid rash present, SCDs present ?HEMATOLOGIC: No significant bruising ?NEUROLOGIC: Oriented to person, place, and time. Nonfocal. Normal muscle tone.  ?PSYCHIATRIC: Normal mood and affect. Normal behavior. Cooperative ? ?Recommendations on discharge:  ?Hold oral anticoagulation for now.  Start on Saturday, Oct 18, 2021 in the evening.  Given her CHA2DS2-VASc score and lupus patient is encouraged to increase physical activity as tolerated to prevent thromboembolic events.  If she has symptoms to suggest stroke she is asked to go to come to the ED via EMS. ?Medication compliance encouraged ?Outpatient follow-up in 2 weeks. ?Plan of care discussed with her brother who is a physician over the phone as well. ? ?Significant Diagnostic Studies: ? ?EKG 10/14/2020 atrial paced ventricular sensed rhythm, prolonged AV  conduction, without underlying injury pattern. ? ?Telemetry: Atrial paced ventricular sensed rhythm. ? ?Labs: ?  ?Lab Results  ?Component Value Date  ? WBC 3.8 (L) 10/14/2021  ? HGB 12.0 10/14/2021  ? HCT 35.8 (L) 10/14/2021  ? MCV 101.7 (H) 10/14/2021  ? PLT 108 (L) 10/14/2021  ?  ?Recent Labs  ?Lab 10/08/21 ?1816 10/10/21 ?[id number] 10/14/21 ?0559  ?NA 139   < > 134*  ?K 4.5   < > 4.3  ?CL 110   < > 105  ?CO2 25   < > 23  ?BUN 14   < > 15  ?CREATININE 1.07*   < > 0.98  ?CALCIUM 8.7*   < > 7.9*  ?PROT 7.4  --   --   ?BILITOT 1.1  --   --   ?ALKPHOS 126  --   --   ?ALT 58*  --   --   ?AST 96*  --   --   ?GLUCOSE 93   < > 192*  ? < > = values in this interval not displayed.  ? ?Estimated Creatinine Clearance: 70 mL/min (by C-G formula based on SCr of 0.98 mg/dL). ? ?Lipid Panel  ?   ?Component Value Date/Time  ? CHOL 66 10/09/2021 0148  ? TRIG 12 10/09/2021 0148  ? HDL 30 (L) 10/09/2021 0148  ? CHOLHDL 2.2 10/09/2021 0148  ? VLDL 2 10/09/2021 0148  ? [place] 34 10/09/2021 0148  ? ? ?BNP (last 3 results) ?Recent Labs  ?  10/08/21 ?1816  ?BNP 211.0*  ? ? ?HEMOGLOBIN A1C ?Lab Results  ?Component Value Date  ? HGBA1C 6.6 (H) 10/08/2021  ? MPG 142.72 10/08/2021  ? ? ?Cardiac Panel (last 3 results) ?No results for input(s): CKTOTAL, CKMB, TROPONINI, RELINDX in the last 8760 hours. ? ?Lab Results  ?Component Value Date  ? CKTOTAL 256 (H) 01/17/2019  ?  ? ?TSH ?Recent Labs  ?  10/08/21 ?1816  ?TSH 1.216  ? ? ?Radiology: ?DG Chest 2 View ? ?Result Date: 10/14/2021 ?CLINICAL DATA:  Generalized weakness.  Post pacemaker. EXAM: CHEST - 2 VIEW COMPARISON:  Chest x-ray 01/18/2019. FINDINGS: No consolidation. No visible pleural effusions or pneumothorax. Left subclavian approach dual lead cardiac rhythm maintenance device. Enlarged cardiac silhouette. Moderate hiatal hernia. No displaced rib fracture. Polyarticular degenerative change. IMPRESSION: 1. No evidence of active cardiopulmonary disease. 2. Cardiomegaly. 3. Moderate hiatal  hernia. Electronically Signed   By: Margaretha Sheffield M.D.   On: 10/14/2021 08:20  ? ?EP PPM/ICD IMPLANT ? ?Result Date: 10/13/2021 ? CONCLUSIONS:  1. Symptomatic bradycardia and sinus node dysfunction  2. Successful dual chamber permanent pacemaker implantation  3.  No early apparent complications.  ? ?LONG TERM MONITOR (3-14 DAYS) ? ?Result Date: 10/08/2021 ?14 day extended Holter monitor: Patch Wear Time:  12 days and 11 hours Dominant rhythm sinus rhythm, followed by sinus bradycardia (94% burden) with intermittent junctional escape rhythm. Heart rate 25-116 bpm.  Avg HR 43bpm. No atrial fibrillation, ventricular tachycardia, high grade AV block. Total ventricular ectopic burden <1%. Total supraventricular ectopic burden 2.1%. 488 pauses noted which were 3 seconds or longer. Longest pause:09/19/2021 at 9:17 AM 4.4 seconds. Second longest pause: 09/24/2021 at 6:12 AM 4.3 seconds. Third longest pause: 09/24/2021 at 1:44 PM 4.2 seconds. Patient triggered events: 0.  ? ?FOLLOW UP PLANS AND APPOINTMENTS ? ?Allergies as of 10/14/2021   ? ?   Reactions  ? Penicillins Hives, Swelling  ? Did it involve swelling of the face/tongue/throat, SOB, or low  BP? Yes ?Did it involve sudden or severe rash/hives, skin peeling, or any reaction on the inside of your mouth or nose? No ?Did you need to seek medical attention at a hospital or doctor's office? No ?When did it last happen? <less than 10 years      ?If all above answers are ?NO?, may proceed with cephalosporin use.  ? ?  ? ?  ?Medication List  ?  ? ?STOP taking these medications   ? ?amiodarone 200 MG tablet ?Commonly known as: PACERONE ?  ?apixaban 5 MG Tabs tablet ?Commonly known as: Eliquis ?  ?furosemide 40 MG tablet ?Commonly known as: LASIX ?  ?Xarelto Starter Pack ?Generic drug: Rivaroxaban Stater Pack (15 mg and 20 mg) ?Replaced by: rivaroxaban 20 MG Tabs tablet ?  ? ?  ? ?TAKE these medications   ? ?acetaminophen 325 MG tablet ?Commonly known as: TYLENOL ?Take 650 mg by  mouth every 6 (six) hours as needed for pain or moderate pain. ?  ?albuterol 108 (90 Base) MCG/ACT inhaler ?Commonly known as: VENTOLIN HFA ?Inhale 2 puffs into the lungs every 6 (six) hours as needed for whe

## 2021-10-14 NOTE — Progress Notes (Signed)
Mobility Specialist Progress Note ? ? 10/14/21 1502  ?Mobility  ?Activity Ambulated independently in hallway  ?Level of Assistance Modified independent, requires aide device or extra time  ?Assistive Device Four point cane  ?Distance Ambulated (ft) 116 ft  ?Activity Response Tolerated well  ?$Mobility charge 1 Mobility  ? ?Received pt in chair having no complaints and agreeable to mobility. Asymptomatic throughout ambulation, returned back to chair w/ call bell in reach and all needs met. ? ?Holland Falling ?Mobility Specialist ?Phone Number [phone] ? ?

## 2021-10-28 ENCOUNTER — Ambulatory Visit: Payer: Medicare Other | Admitting: Student

## 2021-10-28 ENCOUNTER — Encounter: Payer: Self-pay | Admitting: Student

## 2021-10-28 VITALS — BP 142/73 | HR 95 | Temp 97.8°F | Resp 16 | Ht 64.0 in | Wt 254.0 lb

## 2021-10-28 DIAGNOSIS — I480 Paroxysmal atrial fibrillation: Secondary | ICD-10-CM

## 2021-10-28 DIAGNOSIS — I2510 Atherosclerotic heart disease of native coronary artery without angina pectoris: Secondary | ICD-10-CM

## 2021-10-28 DIAGNOSIS — I495 Sick sinus syndrome: Secondary | ICD-10-CM

## 2021-10-28 DIAGNOSIS — Z950 Presence of cardiac pacemaker: Secondary | ICD-10-CM

## 2021-10-28 DIAGNOSIS — Z7901 Long term (current) use of anticoagulants: Secondary | ICD-10-CM

## 2021-10-28 DIAGNOSIS — R600 Localized edema: Secondary | ICD-10-CM

## 2021-10-28 MED ORDER — AMLODIPINE BESYLATE 5 MG PO TABS: 5.0000 mg | ORAL_TABLET | Freq: Every day | ORAL | 3 refills | Status: AC

## 2021-10-28 NOTE — Progress Notes (Signed)
? ?ID:  Delenn Fetting, DOB 1956-02-23, MRN [id number] ? ?PCP:  Andria Frames, PA-C  ?Cardiologist:  Alethia Berthold, DO, [facility] (established care September 15, 2021) ?Former Cardiology Providers: Horton Finer, MD   ? ?REASON FOR CONSULT: Paroxysmal atrial fibrillation and Bradycardia  ? ?REQUESTING PHYSICIAN:  ?Andria Frames, PA-C ?[place] ?[place],  [place] [address] ? ?Chief Complaint  ?Patient presents with  ? Hospitalization Follow-up  ?  2 week  ? ? ?HPI  ?Stacey Burns is a 66 y.o. African-American female whose past medical history and cardiovascular risk factors include: Paroxysmal atrial fibrillation, history of direct-current cardioversion, insulin-dependent diabetes mellitus, hyperlipidemia, hypertension, aortic atherosclerosis, CAD, sleep apnea on CPAP, history of stroke (per EMR / patient unaware), lupus, former smoker, Hx of COVID infection, post menopausal, obesity due to excess calories.  ? ?She was originally referred to the office at the request of Gerald Leitz for evaluation of paroxysmal atrial fibrillation and bradycardia in April 2023.  ? ?Patient underwent cardiac monitor which noted 488 episodes of pauses 3 seconds or longer with the longest pause being 4.4 seconds.  Clinically patient had been experiencing fatigue and dyspnea on exertion as well as multiple falls in the last 3 years.  Patient was therefore hospitalized for pacemaker evaluation.  Given no possible causes of symptomatic bradycardia patient underwent pacemaker implantation on 10/13/2021 with Dr. Quentin Ore for sinus node dysfunction.  She is now status post Biotronik DDD DR permanent pacemaker implantation.  ? ?Patient was subsequently discharged 10/14/2021 and Xarelto, amiodarone was discontinued during hospitalization.  Patient now presents for follow-up.  Patient reports she is essentially asymptomatic following implantation of pacemaker.  Reports significant improvement of  dyspnea and fatigue.  She has had no recurrence of falls.  She does continue to work with physical therapy given chronic knee pain.  Denies syncope, near syncope, dizziness. ? ?Patient has follow-up with EP tomorrow as well as August 2023. ? ?Atrial fibrillation history: ?Per patient diagnosed around 2015.  Has undergone cardioversion x2 which she describes as "they shutdown my heart twice and did a reboot."  Per EMR underwent ablation in 2018 while in [place] (see care Everywhere OV 12/23/2018).  Was on amiodarone starting in 2015, however this was discontinued given recent symptomatic bradycardia and 10/2021.  She is currently on Xarelto for thromboembolic prophylaxis.  No history of GI or intracranial bleeding. ? ?FUNCTIONAL STATUS: ?Walks with a cane. No structured exercise program or daily routine.  ? ?ALLERGIES: ?Allergies  ?Allergen Reactions  ? Penicillins Hives and Swelling  ?  Did it involve swelling of the face/tongue/throat, SOB, or low BP? Yes ?Did it involve sudden or severe rash/hives, skin peeling, or any reaction on the inside of your mouth or nose? No ?Did you need to seek medical attention at a hospital or doctor's office? No ?When did it last happen? <less than 10 years      ?If all above answers are ?NO?, may proceed with cephalosporin use. ?  ? ? ?MEDICATION LIST PRIOR TO VISIT: ?Current Meds  ?Medication Sig  ? acetaminophen (TYLENOL) 325 MG tablet Take 650 mg by mouth every 6 (six) hours as needed for pain or moderate pain.  ? albuterol (VENTOLIN HFA) 108 (90 Base) MCG/ACT inhaler Inhale 2 puffs into the lungs every 6 (six) hours as needed for wheezing or shortness of breath.  ? amLODipine (NORVASC) 5 MG tablet Take 1 tablet (5 mg total) by mouth daily.  ? atorvastatin (LIPITOR)  80 MG tablet Take 80 mg by mouth daily.  ? azaTHIOprine (IMURAN) 50 MG tablet Take 175 mg by mouth daily at 12 noon. Three tablets and a half once a day  ? Dulaglutide (TRULICITY) A999333 0000000 SOPN Inject 0.75 mg  into the skin every 14 (fourteen) days.  ? Fluticasone-Umeclidin-Vilant (TRELEGY ELLIPTA) 200-62.5-25 MCG/ACT AEPB Inhale 1 puff into the lungs daily as needed (sob).  ? glucose blood test strip Use as instructed  ? insulin NPH Human (NOVOLIN N) 100 UNIT/ML injection INJECT 20 UNITS WITH BREAKFAST  DAILY  ? insulin NPH-regular Human (70-30) 100 UNIT/ML injection 28 units with breakfast, 15 units with dinner (Patient taking differently: 20 Units every evening.)  ? Insulin Syringe-Needle U-100 30G X 5/16" 0.3 ML MISC 2 each by Does not apply route 2 (two) times daily.  ? lisinopril (PRINIVIL,ZESTRIL) 40 MG tablet Take 1 tablet (40 mg total) by mouth daily.  ? rivaroxaban (XARELTO) 20 MG TABS tablet Take 1 tablet (20 mg total) by mouth daily with supper.  ?  ? ?PAST MEDICAL HISTORY: ?Past Medical History:  ?Diagnosis Date  ? Asthma   ? Coronary artery disease   ? CPAP (continuous positive airway pressure) dependence   ? Diabetes mellitus without complication ([place])   ? Hypertension   ? Lupus ([place])   ? Paroxysmal atrial fibrillation ([facility])   ? Stroke [facility])   ? ? ?PAST SURGICAL HISTORY: ?Past Surgical History:  ?Procedure Laterality Date  ? ATRIAL FIBRILLATION ABLATION    ? CHOLECYSTECTOMY    ? PACEMAKER IMPLANT N/A 10/13/2021  ? Procedure: PACEMAKER IMPLANT;  Surgeon: Vickie Epley, MD;  Location: [place] [facility];  Service: Cardiovascular;  Laterality: N/A;  ? ? ?FAMILY HISTORY: ?The patient family history includes Breast cancer in her sister and sister; Stroke in her mother. ? ?SOCIAL HISTORY:  ?The patient  reports that she has never smoked. She has never used smokeless tobacco. She reports that she does not drink alcohol and does not use drugs. ? ?REVIEW OF SYSTEMS: ?Review of Systems  ?Constitutional: Negative for malaise/fatigue (resolved).  ?Cardiovascular:  Negative for chest pain, dyspnea on exertion, leg swelling, near-syncope, orthopnea, palpitations, paroxysmal nocturnal dyspnea and syncope.   ?Respiratory:  Positive for shortness of breath (chronic and stable).   ? ?PHYSICAL EXAM: ? ?  10/28/2021  ?  9:23 AM 10/28/2021  ?  9:04 AM 10/14/2021  ? 11:37 AM  ?Vitals with BMI  ?Height  5\' 4"    ?Weight  254 lbs   ?BMI  43.58   ?Systolic A999333 [id number] A999333  ?Diastolic 73 83 76  ?Pulse  95 78  ? ?CONSTITUTIONAL: Appears older than stated age, hemodynamically stable, no acute distress, ambulates with a cane  ?SKIN: Skin is warm and dry. No cyanosis. No pallor. No jaundice ?HEAD: Normocephalic and atraumatic.  ?EYES: No scleral icterus ?MOUTH/THROAT: Moist oral membranes.  ?NECK: No JVD present. No thyromegaly noted. No carotid bruits  ?LYMPHATIC: No visible cervical adenopathy.  ?CHEST Normal respiratory effort. No intercostal retractions. Pacemaker bandages clean, dry, and in tact.  ?LUNGS: Clear to auscultation bilaterally.  No stridor. No wheezes. No rales.  ?CARDIOVASCULAR: regular rate and rhythm, positive S1-S2, no murmurs rubs or gallops appreciated. ?ABDOMINAL: Obese, soft, nontender, nondistended, positive bowel sounds in all 4 quadrants, no apparent ascites.  ?EXTREMITIES: 1+ lower extremity edema to ankle, warm to touch, 1+ bilateral DP and PT pulses, discoid rash present bilaterally. ?HEMATOLOGIC: No significant bruising ?NEUROLOGIC: Oriented to person, place, and time. Nonfocal.  Normal muscle tone.  ?PSYCHIATRIC: Normal mood and affect. Normal behavior. Cooperative ? ?RADIOLOGY: ?CT chest without contrast: ?Available in Care Everywhere. ?Examination date 08/31/2021 ?1. Pulmonary parenchymal pattern of fibrosis, as described above, minimally improved from 03/12/2019 and compatible with the sequelae of COVID-19 pneumonia. Findings are suggestive of an alternative diagnosis (not UIP) per consensus guidelines: Diagnosis of Idiopathic Pulmonary Fibrosis: An Official ATS/ERS/JRS/ALAT Clinical Practice Guideline. [place], Iss 5, [phone], Feb 13 2017. ?2. New upper lobe pulmonary nodules.  Non-contrast chest CT at 3-6 months is recommended. If the nodules are stable at time of repeat CT, then future CT at 18-24 months (from today's scan) is considered optional for low-risk patients, but is recommended for

## 2021-10-29 ENCOUNTER — Ambulatory Visit (INDEPENDENT_AMBULATORY_CARE_PROVIDER_SITE_OTHER): Payer: Medicare Other

## 2021-10-29 DIAGNOSIS — I495 Sick sinus syndrome: Secondary | ICD-10-CM | POA: Diagnosis not present

## 2021-10-29 DIAGNOSIS — R001 Bradycardia, unspecified: Secondary | ICD-10-CM | POA: Diagnosis not present

## 2021-10-29 LAB — CUP PACEART INCLINIC DEVICE CHECK
Brady Statistic RA Percent Paced: 98 %
Brady Statistic RV Percent Paced: 4 %
Date Time Interrogation Session: 20230517183931
Implantable Lead Implant Date: 20230501
Implantable Lead Implant Date: 20230501
Implantable Lead Location: 753859
Implantable Lead Location: 753860
Implantable Lead Model: 377169
Implantable Lead Model: 377169
Implantable Lead Serial Number: 8000792065
Implantable Lead Serial Number: 8000824449
Implantable Pulse Generator Implant Date: 20230501
Lead Channel Pacing Threshold Amplitude: 0.6 V
Lead Channel Pacing Threshold Amplitude: 0.8 V
Lead Channel Sensing Intrinsic Amplitude: 5.4 mV
Lead Channel Sensing Intrinsic Amplitude: 6.5 mV
Pulse Gen Model: 407145
Pulse Gen Serial Number: 70386307

## 2021-10-29 NOTE — Progress Notes (Signed)
Wound check appointment. Steri-strips removed. Wound without redness or edema. Medial aspect of wound remains unapproximated. Steri-strips reapplied and patient informed not to shower until return wound check appointment 11/06/21 at 4:00 (see pic in epic). Normal device function. Thresholds, sensing, and impedances consistent with implant measurements. Device programmed at 3.0V for extra safety margin until 3 month visit. Histogram distribution appropriate for patient and level of activity. No mode switches or high ventricular rates noted. Patient educated about wound care, arm mobility, lifting restrictions. Patient enrolled in remote monitoring with next scheduled transmission 01/15/22. 91 day follow up with Dr. Lalla Brothers 01/19/22.  ? ? ?

## 2021-10-29 NOTE — Patient Instructions (Addendum)
? ?  After Your Pacemaker ? ? ?Monitor your pacemaker site for redness, swelling, and drainage. Call the device clinic at [phone] if you experience these symptoms or fever/chills. ? ?Please do not shower until you return for your wound re-check 11/06/21 at 4:00 pm  ? ?Do not lift, push or pull greater than 10 pounds with the affected arm until 6 weeks after your procedure. There are no other restrictions in arm movement after your wound check appointment. June 14 ? ?You may drive, unless driving has been restricted by your healthcare providers. ? ?Your Pacemaker is MRI compatible. ? ?Remote monitoring is used to monitor your pacemaker from home. This monitoring is scheduled every 91 days by our office. It allows [place] to keep an eye on the functioning of your device to ensure it is working properly. You will routinely see your Electrophysiologist annually (more often if necessary).  ?

## 2021-11-06 ENCOUNTER — Ambulatory Visit (INDEPENDENT_AMBULATORY_CARE_PROVIDER_SITE_OTHER)

## 2021-11-06 DIAGNOSIS — I495 Sick sinus syndrome: Secondary | ICD-10-CM

## 2021-11-06 NOTE — Patient Instructions (Signed)
? ?  After Your Pacemaker ? ? ?Monitor your pacemaker site for redness, swelling, and drainage. Call the device clinic at [phone] if you experience these symptoms or fever/chills. ? ?

## 2021-11-06 NOTE — Progress Notes (Signed)
Patient seen in device clinic for wound recheck. Steri-strips removed and wound appear well healed.  No redness or edema noted. Edges well approximated.  Patient advised she can wash the site daily with warm soapy water and clean wash cloth. Advised to keep area clean and dry.  Do not apply ointments, lotions or creams to site until completely healed.  Advised to call if she has any redness, swelling, drainage, opening of incision, fever or chills. Voiced understanding. Direct phone number provided.

## 2021-11-24 ENCOUNTER — Ambulatory Visit: Payer: Medicare Other

## 2021-11-24 DIAGNOSIS — I340 Nonrheumatic mitral (valve) insufficiency: Secondary | ICD-10-CM

## 2021-11-24 DIAGNOSIS — I495 Sick sinus syndrome: Secondary | ICD-10-CM

## 2021-11-24 DIAGNOSIS — I361 Nonrheumatic tricuspid (valve) insufficiency: Secondary | ICD-10-CM

## 2021-11-24 DIAGNOSIS — R600 Localized edema: Secondary | ICD-10-CM

## 2021-11-26 ENCOUNTER — Ambulatory Visit: Payer: Medicare Other

## 2021-11-26 DIAGNOSIS — R001 Bradycardia, unspecified: Secondary | ICD-10-CM

## 2021-11-26 NOTE — Progress Notes (Signed)
Called patient, NA, mailbox is full and cannot receive any messages at this time.

## 2021-11-26 NOTE — Progress Notes (Signed)
Patient called back, I have discussed results with her.

## 2021-11-30 ENCOUNTER — Encounter: Payer: Self-pay | Admitting: Cardiology

## 2021-11-30 DIAGNOSIS — Z45018 Encounter for adjustment and management of other part of cardiac pacemaker: Secondary | ICD-10-CM

## 2021-11-30 DIAGNOSIS — Z950 Presence of cardiac pacemaker: Secondary | ICD-10-CM | POA: Insufficient documentation

## 2021-11-30 HISTORY — DX: Presence of cardiac pacemaker: Z95.0

## 2021-11-30 HISTORY — DX: Encounter for adjustment and management of other part of cardiac pacemaker: Z45.018

## 2021-11-30 LAB — PCV MYOCARDIAL PERFUSION WITH LEXISCAN: ST Depression (mm): 0 mm

## 2021-12-04 NOTE — Progress Notes (Signed)
Called pt, no answer. Unable to leave VM requesting call back.

## 2021-12-15 ENCOUNTER — Emergency Department (HOSPITAL_COMMUNITY): Payer: Medicare Other

## 2021-12-15 ENCOUNTER — Inpatient Hospital Stay (HOSPITAL_COMMUNITY): Payer: Medicare Other

## 2021-12-15 ENCOUNTER — Other Ambulatory Visit (HOSPITAL_COMMUNITY): Payer: Medicare Other

## 2021-12-15 ENCOUNTER — Inpatient Hospital Stay (HOSPITAL_COMMUNITY)
Admission: EM | Admit: 2021-12-15 | Discharge: 2022-01-13 | DRG: 871 | Disposition: E | Payer: Medicare Other | Attending: Critical Care Medicine | Admitting: Critical Care Medicine

## 2021-12-15 ENCOUNTER — Encounter (HOSPITAL_COMMUNITY): Payer: Self-pay

## 2021-12-15 DIAGNOSIS — M329 Systemic lupus erythematosus, unspecified: Secondary | ICD-10-CM | POA: Diagnosis present

## 2021-12-15 DIAGNOSIS — N170 Acute kidney failure with tubular necrosis: Secondary | ICD-10-CM | POA: Diagnosis present

## 2021-12-15 DIAGNOSIS — R6521 Severe sepsis with septic shock: Secondary | ICD-10-CM | POA: Diagnosis present

## 2021-12-15 DIAGNOSIS — K5732 Diverticulitis of large intestine without perforation or abscess without bleeding: Secondary | ICD-10-CM | POA: Diagnosis present

## 2021-12-15 DIAGNOSIS — Z66 Do not resuscitate: Secondary | ICD-10-CM | POA: Diagnosis not present

## 2021-12-15 DIAGNOSIS — Z794 Long term (current) use of insulin: Secondary | ICD-10-CM

## 2021-12-15 DIAGNOSIS — E875 Hyperkalemia: Secondary | ICD-10-CM | POA: Diagnosis present

## 2021-12-15 DIAGNOSIS — E872 Acidosis, unspecified: Secondary | ICD-10-CM | POA: Diagnosis present

## 2021-12-15 DIAGNOSIS — Z515 Encounter for palliative care: Secondary | ICD-10-CM

## 2021-12-15 DIAGNOSIS — K760 Fatty (change of) liver, not elsewhere classified: Secondary | ICD-10-CM | POA: Diagnosis present

## 2021-12-15 DIAGNOSIS — K5792 Diverticulitis of intestine, part unspecified, without perforation or abscess without bleeding: Secondary | ICD-10-CM | POA: Diagnosis present

## 2021-12-15 DIAGNOSIS — J9601 Acute respiratory failure with hypoxia: Secondary | ICD-10-CM | POA: Diagnosis present

## 2021-12-15 DIAGNOSIS — D649 Anemia, unspecified: Secondary | ICD-10-CM | POA: Diagnosis present

## 2021-12-15 DIAGNOSIS — E8770 Fluid overload, unspecified: Secondary | ICD-10-CM | POA: Diagnosis not present

## 2021-12-15 DIAGNOSIS — Z8673 Personal history of transient ischemic attack (TIA), and cerebral infarction without residual deficits: Secondary | ICD-10-CM

## 2021-12-15 DIAGNOSIS — R296 Repeated falls: Secondary | ICD-10-CM | POA: Diagnosis present

## 2021-12-15 DIAGNOSIS — Z950 Presence of cardiac pacemaker: Secondary | ICD-10-CM

## 2021-12-15 DIAGNOSIS — I2510 Atherosclerotic heart disease of native coronary artery without angina pectoris: Secondary | ICD-10-CM | POA: Diagnosis present

## 2021-12-15 DIAGNOSIS — G9341 Metabolic encephalopathy: Secondary | ICD-10-CM | POA: Diagnosis not present

## 2021-12-15 DIAGNOSIS — A419 Sepsis, unspecified organism: Secondary | ICD-10-CM | POA: Diagnosis not present

## 2021-12-15 DIAGNOSIS — A412 Sepsis due to unspecified staphylococcus: Principal | ICD-10-CM | POA: Diagnosis present

## 2021-12-15 DIAGNOSIS — J810 Acute pulmonary edema: Secondary | ICD-10-CM | POA: Diagnosis not present

## 2021-12-15 DIAGNOSIS — E11649 Type 2 diabetes mellitus with hypoglycemia without coma: Secondary | ICD-10-CM | POA: Diagnosis not present

## 2021-12-15 DIAGNOSIS — D84821 Immunodeficiency due to drugs: Secondary | ICD-10-CM | POA: Diagnosis present

## 2021-12-15 DIAGNOSIS — R7881 Bacteremia: Secondary | ICD-10-CM | POA: Diagnosis not present

## 2021-12-15 DIAGNOSIS — Z87891 Personal history of nicotine dependence: Secondary | ICD-10-CM

## 2021-12-15 DIAGNOSIS — D6959 Other secondary thrombocytopenia: Secondary | ICD-10-CM | POA: Diagnosis present

## 2021-12-15 DIAGNOSIS — D65 Disseminated intravascular coagulation [defibrination syndrome]: Secondary | ICD-10-CM | POA: Diagnosis not present

## 2021-12-15 DIAGNOSIS — J449 Chronic obstructive pulmonary disease, unspecified: Secondary | ICD-10-CM | POA: Diagnosis present

## 2021-12-15 DIAGNOSIS — Z8616 Personal history of COVID-19: Secondary | ICD-10-CM

## 2021-12-15 DIAGNOSIS — Z823 Family history of stroke: Secondary | ICD-10-CM

## 2021-12-15 DIAGNOSIS — K449 Diaphragmatic hernia without obstruction or gangrene: Secondary | ICD-10-CM | POA: Diagnosis present

## 2021-12-15 DIAGNOSIS — I480 Paroxysmal atrial fibrillation: Secondary | ICD-10-CM | POA: Diagnosis present

## 2021-12-15 DIAGNOSIS — E861 Hypovolemia: Secondary | ICD-10-CM | POA: Diagnosis present

## 2021-12-15 DIAGNOSIS — Z9911 Dependence on respirator [ventilator] status: Secondary | ICD-10-CM | POA: Diagnosis not present

## 2021-12-15 DIAGNOSIS — Z7951 Long term (current) use of inhaled steroids: Secondary | ICD-10-CM

## 2021-12-15 DIAGNOSIS — G4733 Obstructive sleep apnea (adult) (pediatric): Secondary | ICD-10-CM | POA: Diagnosis present

## 2021-12-15 DIAGNOSIS — R579 Shock, unspecified: Secondary | ICD-10-CM | POA: Diagnosis not present

## 2021-12-15 DIAGNOSIS — Z7901 Long term (current) use of anticoagulants: Secondary | ICD-10-CM

## 2021-12-15 DIAGNOSIS — Z6841 Body Mass Index (BMI) 40.0 and over, adult: Secondary | ICD-10-CM

## 2021-12-15 DIAGNOSIS — I10 Essential (primary) hypertension: Secondary | ICD-10-CM | POA: Diagnosis present

## 2021-12-15 DIAGNOSIS — E785 Hyperlipidemia, unspecified: Secondary | ICD-10-CM | POA: Diagnosis present

## 2021-12-15 DIAGNOSIS — R918 Other nonspecific abnormal finding of lung field: Secondary | ICD-10-CM | POA: Diagnosis present

## 2021-12-15 DIAGNOSIS — I495 Sick sinus syndrome: Secondary | ICD-10-CM | POA: Diagnosis present

## 2021-12-15 DIAGNOSIS — Z9049 Acquired absence of other specified parts of digestive tract: Secondary | ICD-10-CM

## 2021-12-15 DIAGNOSIS — K5900 Constipation, unspecified: Secondary | ICD-10-CM | POA: Diagnosis present

## 2021-12-15 DIAGNOSIS — Z7985 Long-term (current) use of injectable non-insulin antidiabetic drugs: Secondary | ICD-10-CM

## 2021-12-15 DIAGNOSIS — K7200 Acute and subacute hepatic failure without coma: Secondary | ICD-10-CM | POA: Diagnosis present

## 2021-12-15 DIAGNOSIS — G9340 Encephalopathy, unspecified: Secondary | ICD-10-CM | POA: Diagnosis not present

## 2021-12-15 DIAGNOSIS — Z20822 Contact with and (suspected) exposure to covid-19: Secondary | ICD-10-CM | POA: Diagnosis present

## 2021-12-15 DIAGNOSIS — Z79624 Long term (current) use of inhibitors of nucleotide synthesis: Secondary | ICD-10-CM

## 2021-12-15 DIAGNOSIS — N179 Acute kidney failure, unspecified: Secondary | ICD-10-CM | POA: Diagnosis not present

## 2021-12-15 DIAGNOSIS — R34 Anuria and oliguria: Secondary | ICD-10-CM | POA: Diagnosis not present

## 2021-12-15 DIAGNOSIS — Z79899 Other long term (current) drug therapy: Secondary | ICD-10-CM

## 2021-12-15 DIAGNOSIS — Z880 Allergy status to penicillin: Secondary | ICD-10-CM

## 2021-12-15 LAB — COMPREHENSIVE METABOLIC PANEL
ALT: 396 U/L — ABNORMAL HIGH (ref 0–44)
AST: 962 U/L — ABNORMAL HIGH (ref 15–41)
Albumin: 1.6 g/dL — ABNORMAL LOW (ref 3.5–5.0)
Alkaline Phosphatase: 243 U/L — ABNORMAL HIGH (ref 38–126)
Anion gap: 12 (ref 5–15)
BUN: 42 mg/dL — ABNORMAL HIGH (ref 8–23)
CO2: 13 mmol/L — ABNORMAL LOW (ref 22–32)
Calcium: 7.2 mg/dL — ABNORMAL LOW (ref 8.9–10.3)
Chloride: 112 mmol/L — ABNORMAL HIGH (ref 98–111)
Creatinine, Ser: 5.07 mg/dL — ABNORMAL HIGH (ref 0.44–1.00)
GFR, Estimated: 9 mL/min — ABNORMAL LOW (ref >60)
Glucose, Bld: 89 mg/dL (ref 70–99)
Potassium: 5.8 mmol/L — ABNORMAL HIGH (ref 3.5–5.1)
Sodium: 137 mmol/L (ref 135–145)
Total Bilirubin: 3.8 mg/dL — ABNORMAL HIGH (ref 0.3–1.2)
Total Protein: 4.8 g/dL — ABNORMAL LOW (ref 6.5–8.1)

## 2021-12-15 LAB — CBC WITH DIFFERENTIAL/PLATELET
Abs Immature Granulocytes: 0.05 10*3/uL (ref 0.00–0.07)
Basophils Absolute: 0 10*3/uL (ref 0.0–0.1)
Basophils Relative: 0 %
Eosinophils Absolute: 0 10*3/uL (ref 0.0–0.5)
Eosinophils Relative: 0 %
HCT: 31.6 % — ABNORMAL LOW (ref 36.0–46.0)
Hemoglobin: 10.5 g/dL — ABNORMAL LOW (ref 12.0–15.0)
Immature Granulocytes: 1 %
Lymphocytes Relative: 14 %
Lymphs Abs: 0.7 10*3/uL (ref 0.7–4.0)
MCH: 35.4 pg — ABNORMAL HIGH (ref 26.0–34.0)
MCHC: 33.2 g/dL (ref 30.0–36.0)
MCV: 106.4 fL — ABNORMAL HIGH (ref 80.0–100.0)
Monocytes Absolute: 0.4 10*3/uL (ref 0.1–1.0)
Monocytes Relative: 9 %
Neutro Abs: 3.6 10*3/uL (ref 1.7–7.7)
Neutrophils Relative %: 76 %
Platelets: 95 10*3/uL — ABNORMAL LOW (ref 150–400)
RBC: 2.97 MIL/uL — ABNORMAL LOW (ref 3.87–5.11)
RDW: 20.9 % — ABNORMAL HIGH (ref 11.5–15.5)
WBC: 4.8 10*3/uL (ref 4.0–10.5)
nRBC: 1.9 % — ABNORMAL HIGH (ref 0.0–0.2)

## 2021-12-15 LAB — URINALYSIS, ROUTINE W REFLEX MICROSCOPIC
Bilirubin Urine: NEGATIVE
Glucose, UA: 50 mg/dL — AB
Ketones, ur: NEGATIVE mg/dL
Leukocytes,Ua: NEGATIVE
Nitrite: NEGATIVE
Protein, ur: 100 mg/dL — AB
Specific Gravity, Urine: 1.028 (ref 1.005–1.030)
pH: 5 (ref 5.0–8.0)

## 2021-12-15 LAB — PROCALCITONIN: Procalcitonin: 1.83 ng/mL

## 2021-12-15 LAB — I-STAT CHEM 8, ED
BUN: 51 mg/dL — ABNORMAL HIGH (ref 8–23)
Calcium, Ion: 0.86 mmol/L — CL (ref 1.15–1.40)
Chloride: 110 mmol/L (ref 98–111)
Creatinine, Ser: 5.6 mg/dL — ABNORMAL HIGH (ref 0.44–1.00)
Glucose, Bld: 81 mg/dL (ref 70–99)
HCT: 33 % — ABNORMAL LOW (ref 36.0–46.0)
Hemoglobin: 11.2 g/dL — ABNORMAL LOW (ref 12.0–15.0)
Potassium: 5.5 mmol/L — ABNORMAL HIGH (ref 3.5–5.1)
Sodium: 137 mmol/L (ref 135–145)
TCO2: 15 mmol/L — ABNORMAL LOW (ref 22–32)

## 2021-12-15 LAB — GLUCOSE, CAPILLARY
Glucose-Capillary: 63 mg/dL — ABNORMAL LOW (ref 70–99)
Glucose-Capillary: 75 mg/dL (ref 70–99)
Glucose-Capillary: 81 mg/dL (ref 70–99)
Glucose-Capillary: 86 mg/dL (ref 70–99)

## 2021-12-15 LAB — RESP PANEL BY RT-PCR (FLU A&B, COVID) ARPGX2
Influenza A by PCR: NEGATIVE
Influenza B by PCR: NEGATIVE
SARS Coronavirus 2 by RT PCR: NEGATIVE

## 2021-12-15 LAB — TYPE AND SCREEN
ABO/RH(D): B POS
Antibody Screen: NEGATIVE

## 2021-12-15 LAB — LACTIC ACID, PLASMA
Lactic Acid, Venous: 5.5 mmol/L (ref 0.5–1.9)
Lactic Acid, Venous: 6.2 mmol/L (ref 0.5–1.9)
Lactic Acid, Venous: 8 mmol/L (ref 0.5–1.9)

## 2021-12-15 LAB — PROTIME-INR
INR: >10 (ref 0.8–1.2)
INR: >10 (ref 0.8–1.2)
Prothrombin Time: >90 seconds — ABNORMAL HIGH (ref 11.4–15.2)
Prothrombin Time: >90 seconds — ABNORMAL HIGH (ref 11.4–15.2)

## 2021-12-15 LAB — LIPASE, BLOOD
Lipase: 73 U/L — ABNORMAL HIGH (ref 11–51)
Lipase: 75 U/L — ABNORMAL HIGH (ref 11–51)

## 2021-12-15 LAB — BASIC METABOLIC PANEL
Anion gap: 13 (ref 5–15)
BUN: 39 mg/dL — ABNORMAL HIGH (ref 8–23)
CO2: 11 mmol/L — ABNORMAL LOW (ref 22–32)
Calcium: 6.9 mg/dL — ABNORMAL LOW (ref 8.9–10.3)
Chloride: 114 mmol/L — ABNORMAL HIGH (ref 98–111)
Creatinine, Ser: 4.91 mg/dL — ABNORMAL HIGH (ref 0.44–1.00)
GFR, Estimated: 9 mL/min — ABNORMAL LOW (ref >60)
Glucose, Bld: 89 mg/dL (ref 70–99)
Potassium: 5.8 mmol/L — ABNORMAL HIGH (ref 3.5–5.1)
Sodium: 138 mmol/L (ref 135–145)

## 2021-12-15 LAB — MRSA NEXT GEN BY PCR, NASAL: MRSA by PCR Next Gen: NOT DETECTED

## 2021-12-15 LAB — MAGNESIUM: Magnesium: 2.1 mg/dL (ref 1.7–2.4)

## 2021-12-15 LAB — APTT: aPTT: 70 seconds — ABNORMAL HIGH (ref 24–36)

## 2021-12-15 MED ORDER — SODIUM CHLORIDE 0.9 % IV SOLN: INTRAVENOUS | Status: DC | PRN

## 2021-12-15 MED ORDER — LEVOFLOXACIN IN D5W 750 MG/150ML IV SOLN
750.0000 mg | Freq: Once | INTRAVENOUS | Status: AC
  Administered 2021-12-15: 750 mg via INTRAVENOUS
  Filled 2021-12-15: qty 150

## 2021-12-15 MED ORDER — ARFORMOTEROL TARTRATE 15 MCG/2ML IN NEBU
15.0000 ug | INHALATION_SOLUTION | Freq: Two times a day (BID) | RESPIRATORY_TRACT | Status: DC
  Administered 2021-12-15 – 2021-12-17 (×4): 15 ug via RESPIRATORY_TRACT
  Filled 2021-12-15 (×5): qty 2

## 2021-12-15 MED ORDER — LACTATED RINGERS IV SOLN: INTRAVENOUS | Status: DC

## 2021-12-15 MED ORDER — SODIUM CHLORIDE 0.9 % IV BOLUS
1000.0000 mL | Freq: Once | INTRAVENOUS | Status: AC
  Administered 2021-12-15: 1000 mL via INTRAVENOUS

## 2021-12-15 MED ORDER — INSULIN ASPART 100 UNIT/ML IJ SOLN: 0.0000 [IU] | INTRAMUSCULAR | Status: DC

## 2021-12-15 MED ORDER — PANTOPRAZOLE SODIUM 40 MG IV SOLR
40.0000 mg | INTRAVENOUS | Status: DC
  Administered 2021-12-15 – 2021-12-16 (×2): 40 mg via INTRAVENOUS
  Filled 2021-12-15 (×2): qty 10

## 2021-12-15 MED ORDER — LIP MEDEX EX OINT
TOPICAL_OINTMENT | CUTANEOUS | Status: DC | PRN
  Filled 2021-12-15: qty 7

## 2021-12-15 MED ORDER — DICYCLOMINE HCL 10 MG/ML IM SOLN
20.0000 mg | Freq: Once | INTRAMUSCULAR | Status: AC
  Administered 2021-12-15: 20 mg via INTRAMUSCULAR
  Filled 2021-12-15: qty 2

## 2021-12-15 MED ORDER — FENTANYL CITRATE PF 50 MCG/ML IJ SOSY
25.0000 ug | PREFILLED_SYRINGE | INTRAMUSCULAR | Status: DC | PRN
  Administered 2021-12-15: 25 ug via INTRAVENOUS
  Filled 2021-12-15: qty 1

## 2021-12-15 MED ORDER — CHLORHEXIDINE GLUCONATE CLOTH 2 % EX PADS: 6.0000 | MEDICATED_PAD | Freq: Every day | CUTANEOUS | Status: DC

## 2021-12-15 MED ORDER — REVEFENACIN 175 MCG/3ML IN SOLN
175.0000 ug | Freq: Every day | RESPIRATORY_TRACT | Status: DC
  Administered 2021-12-16 – 2021-12-17 (×2): 175 ug via RESPIRATORY_TRACT
  Filled 2021-12-15 (×3): qty 3

## 2021-12-15 MED ORDER — SODIUM ZIRCONIUM CYCLOSILICATE 10 G PO PACK
10.0000 g | PACK | Freq: Once | ORAL | Status: AC
  Administered 2021-12-15: 10 g via ORAL
  Filled 2021-12-15: qty 1

## 2021-12-15 MED ORDER — FENTANYL CITRATE PF 50 MCG/ML IJ SOSY
50.0000 ug | PREFILLED_SYRINGE | INTRAMUSCULAR | Status: DC | PRN
  Administered 2021-12-15: 50 ug via INTRAVENOUS
  Filled 2021-12-15: qty 1

## 2021-12-15 MED ORDER — DOCUSATE SODIUM 100 MG PO CAPS: 100.0000 mg | ORAL_CAPSULE | Freq: Two times a day (BID) | ORAL | Status: DC | PRN

## 2021-12-15 MED ORDER — SODIUM CHLORIDE 0.9 % IV SOLN
250.0000 mL | INTRAVENOUS | Status: DC
  Administered 2021-12-16: 250 mL via INTRAVENOUS

## 2021-12-15 MED ORDER — PANTOPRAZOLE SODIUM 40 MG IV SOLR
40.0000 mg | Freq: Once | INTRAVENOUS | Status: AC
  Administered 2021-12-15: 40 mg via INTRAVENOUS
  Filled 2021-12-15: qty 10

## 2021-12-15 MED ORDER — IOHEXOL 350 MG/ML SOLN
100.0000 mL | Freq: Once | INTRAVENOUS | Status: AC | PRN
  Administered 2021-12-15: 100 mL via INTRAVENOUS

## 2021-12-15 MED ORDER — ONDANSETRON HCL 4 MG/2ML IJ SOLN
4.0000 mg | Freq: Four times a day (QID) | INTRAMUSCULAR | Status: DC | PRN
  Administered 2021-12-15 – 2021-12-16 (×3): 4 mg via INTRAVENOUS
  Filled 2021-12-15 (×3): qty 2

## 2021-12-15 MED ORDER — BUDESONIDE 0.25 MG/2ML IN SUSP
0.2500 mg | Freq: Two times a day (BID) | RESPIRATORY_TRACT | Status: DC
  Administered 2021-12-15 – 2021-12-17 (×4): 0.25 mg via RESPIRATORY_TRACT
  Filled 2021-12-15 (×5): qty 2

## 2021-12-15 MED ORDER — LACTATED RINGERS IV BOLUS
1000.0000 mL | Freq: Once | INTRAVENOUS | Status: AC
  Administered 2021-12-15: 1000 mL via INTRAVENOUS

## 2021-12-15 MED ORDER — NOREPINEPHRINE 4 MG/250ML-% IV SOLN
2.0000 ug/min | INTRAVENOUS | Status: DC
  Administered 2021-12-16: 4 ug/min via INTRAVENOUS
  Filled 2021-12-15: qty 250

## 2021-12-15 MED ORDER — SODIUM CHLORIDE 0.9 % IV BOLUS
2000.0000 mL | Freq: Once | INTRAVENOUS | Status: AC
  Administered 2021-12-15: 2000 mL via INTRAVENOUS

## 2021-12-15 MED ORDER — LEVOFLOXACIN IN D5W 500 MG/100ML IV SOLN: 500.0000 mg | INTRAVENOUS | Status: DC

## 2021-12-15 MED ORDER — HYDROMORPHONE HCL 1 MG/ML IJ SOLN
0.5000 mg | Freq: Once | INTRAMUSCULAR | Status: AC
  Administered 2021-12-15: 0.5 mg via INTRAVENOUS
  Filled 2021-12-15: qty 0.5

## 2021-12-15 MED ORDER — DEXTROSE 50 % IV SOLN
INTRAVENOUS | Status: AC
  Filled 2021-12-15: qty 50

## 2021-12-15 MED ORDER — METRONIDAZOLE 500 MG/100ML IV SOLN
500.0000 mg | Freq: Two times a day (BID) | INTRAVENOUS | Status: DC
  Administered 2021-12-15 – 2021-12-17 (×4): 500 mg via INTRAVENOUS
  Filled 2021-12-15 (×4): qty 100

## 2021-12-15 MED ORDER — DEXTROSE 50 % IV SOLN
12.5000 g | INTRAVENOUS | Status: AC
Start: 2021-12-16 — End: 2021-12-16
  Administered 2021-12-16: 12.5 g via INTRAVENOUS

## 2021-12-15 MED ORDER — METRONIDAZOLE 500 MG/100ML IV SOLN
500.0000 mg | Freq: Once | INTRAVENOUS | Status: AC
  Administered 2021-12-15: 500 mg via INTRAVENOUS
  Filled 2021-12-15: qty 100

## 2021-12-15 MED ORDER — POLYETHYLENE GLYCOL 3350 17 G PO PACK: 17.0000 g | PACK | Freq: Every day | ORAL | Status: DC | PRN

## 2021-12-15 MED ORDER — EPINEPHRINE 1 MG/10ML IJ SOSY
PREFILLED_SYRINGE | INTRAMUSCULAR | Status: AC
  Filled 2021-12-15: qty 10

## 2021-12-15 MED ORDER — MORPHINE SULFATE (PF) 2 MG/ML IV SOLN
2.0000 mg | INTRAVENOUS | Status: DC | PRN
  Administered 2021-12-15 – 2021-12-16 (×2): 2 mg via INTRAVENOUS
  Filled 2021-12-15 (×2): qty 1

## 2021-12-15 MED ORDER — CEFTRIAXONE SODIUM 2 G IJ SOLR
2.0000 g | INTRAMUSCULAR | Status: DC
  Administered 2021-12-16: 2 g via INTRAVENOUS
  Filled 2021-12-15: qty 20

## 2021-12-15 NOTE — Progress Notes (Signed)
No viable veins found with use of [place] to both forearms or hands.  Primary RN was made aware.

## 2021-12-15 NOTE — ED Triage Notes (Signed)
Abdominal pain x 3 days with nausea, vomiting and diarrhea. Pt also reports generalized weakness that led to a fall at home on Thursday. Unable to tolerate PO at home. Pt has not taken her meds since yesterday.

## 2021-12-15 NOTE — Progress Notes (Signed)
Patient ID: Stacey Burns, female   DOB: 1956-04-25, 66 y.o.   MRN: [id number] CTA C/A/P reviewed. Mild R colon colitis. No evidence of ischemic colitis or emboli. Hiatal hernia looks stable. I D/W Dr. Wynona Neat and I updated the family at the bedside. No need for emergent surgery. We will follow.  Violeta Gelinas, MD, MPH, FACS Please use [email or web address] to contact on call provider

## 2021-12-15 NOTE — ED Notes (Signed)
Help get patient undressed on the monitor ekg shown to Dr Estell Harpin patient is resting with call bell in reach placed a external cath and placed a brief

## 2021-12-15 NOTE — Progress Notes (Signed)
In and out catheter completed at 1845. Approximately 10 mL urine retrieved. Sent UA.

## 2021-12-15 NOTE — ED Notes (Signed)
Unable to draw labs x 3 attempts by this RN. MD is attempting fem stick to draw labs. IV antibiotics started after first blood cultures per MD orders.

## 2021-12-15 NOTE — Sepsis Progress Note (Signed)
Sepsis protocol monitored by eLink 

## 2021-12-15 NOTE — Progress Notes (Signed)
Called and spoke with [facility] RN. At this time she declined need for a UGPIV. Tomasita Morrow, RN VAST

## 2021-12-15 NOTE — H&P (Signed)
NAME:  Stacey Burns, MRN:  [id number], DOB:  July 09, 1955, LOS: 0 ADMISSION DATE:  12/31/2021, CONSULTATION DATE:  12/29/2021 REFERRING MD:  Roderic Palau, CHIEF COMPLAINT:  Diverticulitis vs colitis with dehydration    History of Present Illness:  66 year old female former smoker ( 32.3 pack year smoking history  quit 1991) with PMH of DM II, Asthma,  CAD, OSA on CPAP , History of COVID 19 infection, pacemaker ( 10/2021) , HTN, Hyperlipidemia Lupus, PAF, stroke, and  lung nodules on recent imaging  who presents to the ED after 3 days at home with abdominal pain, nausea, and vomiting . She does have a history or recent falls. She is currently taking Imuran for her Lupus and Xaralto for atrial fibrillation.  Work up in the ED was significant for Lactic acid initially 5.5 but increased to 6.2 ( After fluids), Na 137, K 5.8 , BUN of 51, Creatinine of 5.07 which increased to 5.60 after fluids, HGB 11.2, HCT 33, WBC 4.8, Platelets 95,000, Calcium 7.2, Total protein 4.8, Albumin 1.6, AST 962, ALT 396, Alk Phos 243, INR > 10, of Total Bili 3.8, GFR 9., Calcium corrects to 9.1 with albumin of 1.6 CT Chest Abdomen Pelvis was significant for diffuse intraabdominal edema, decreasing sensitivity for focal areas of inflammation, large hiatal hernia ( similar to previous imaging) , mild peripancreatic edema ?? pancreatitis , focal edema about the ascending colon could represent colitis or (given scattered diverticula) diverticulitis.Small volume uterine fibroids.  PCCM were asked to admit patient and manage care.  Pertinent  Medical History   Past Medical History:  Diagnosis Date   Asthma    Coronary artery disease    CPAP (continuous positive airway pressure) dependence    Diabetes mellitus without complication ([place])    Encounter for care of pacemaker 11/30/2021   Hypertension    Lupus ([place])    Pacemaker: Biotronik [place]ora Dual chamber Pacemaker 10/13/2021 11/30/2021   Paroxysmal atrial fibrillation ([place])     Sinus node dysfunction ([place])    Stroke ([place])      Significant Hospital Events: Including procedures, antibiotic start and stop dates in addition to other pertinent events   01/04/2022 >> Admission   Interim History / Subjective:  Complaints of being cold, feels bad . Nausea and vomiting Has not eaten last 48-72 hours Upper Abdominal pain worse Left > right  Lactate has increased despite IVF from 5.5. to 6.2  Objective   Blood pressure (!) 105/37, pulse 72, temperature 97.7 F (36.5 C), temperature source Axillary, resp. rate 17, height '5\' 4"'  (1.626 m), weight 113.4 kg, SpO2 98 %.        Intake/Output Summary (Last 24 hours) at 01/02/2022 1230 Last data filed at 12/18/2021 1140 Gross per 24 hour  Intake 1300 ml  Output --  Net 1300 ml   Filed Weights   01/01/2022 0904  Weight: 113.4 kg    Examination: General:  Chronically ill female, Awake and alert, in NAD, on RA  HENT:  NCAT, Thick neck , No LAD, No discernible JVD, No LAD Lungs: Bilateral chest excursion , Few rhonchi, diminished per bases  Cardiovascular: S1, S2, , No RMG, regular Abdomen:  Soft, generalized tenderness with LUQ tenderness > RUQ tenderness Extremities:  No obvious deformities, cool to touch Neuro:  Awake and alert, oriented x 3, MAE x 4 GU:  Pt has not voided since admission   Resolved Hospital Problem list     Assessment & Plan:  Diffuse Abdominal Pain suspicious  for Colitis or Diverticulitis Immunocompromised patient on Imuran Lactate increase despite IVF Plan Additional liter LR now RUQ [place] Trend Lactate >> Next due 1500 Start ceftriaxone + flagyl for IAI coverage ( PCN allergy)  Follow Blood Cultures for results Trend fever WBC and fever curve Trend total bili, lipase  Trend Pro calcitonin  If lactate continues to rise, will consult surgery to evaluate for ischemia as etiology Zofran Protonix  Hyperkalemia Elevated BUN/ Creatinine Plan IVF as above Strict I&O Trend BMET, next 1500  today Replete electrolytes , replete as needed  Avoid nephrotoxic medications , maintain renal perfusion  UA when patient is able to void ( Refused I&O cath)  Elevated LFT's  INR > 10  ? Reactive  Plan Trend INR Bleeding precautions  If remains elevated consider Vitamin K  Trend LFT's  Hold Xaralto for now, if INR corrects can resume  Hx. Hypertension Plan Hold home antihypertensive medications for now 2 D Echo now MAP Goal > 65 mm Hg Will add pressors if needed  DM II Plan CBG Q 4  and SSI  Consider A1C  Asthma/ COPD Plan Candiss [place], Budesomide scheduled  Lupus Plan Resume Imuran once able to take PO meds    Best Practice (right click and "Reselect all SmartList Selections" daily)   Diet/type: NPO DVT prophylaxis: SCD GI prophylaxis: PPI Lines: N/A Foley:  N/A Code Status:  full code Last date of multidisciplinary goals of care discussion [Daughter and brother updated at bedside in the ED 7/3 by Dr. Silas Flood  Labs   CBC: Recent Labs  Lab 01/04/2022 1008 01/07/2022 1024  WBC 4.8  --   NEUTROABS 3.6  --   HGB 10.5* 11.2*  HCT 31.6* 33.0*  MCV 106.4*  --   PLT 95*  --     Basic Metabolic Panel: Recent Labs  Lab 12/20/2021 1008 01/02/2022 1024  NA 137 137  K 5.8* 5.5*  CL 112* 110  CO2 13*  --   GLUCOSE 89 81  BUN 42* 51*  CREATININE 5.07* 5.60*  CALCIUM 7.2*  --    GFR: Estimated Creatinine Clearance: 12.2 mL/min (A) (by C-G formula based on SCr of 5.6 mg/dL (H)). Recent Labs  Lab 12/21/2021 1008  WBC 4.8  LATICACIDVEN 5.5*    Liver Function Tests: Recent Labs  Lab 12/29/2021 1008  AST 962*  ALT 396*  ALKPHOS 243*  BILITOT 3.8*  PROT 4.8*  ALBUMIN 1.6*   No results for input(s): "LIPASE", "AMYLASE" in the last 168 hours. No results for input(s): "AMMONIA" in the last 168 hours.  ABG    Component Value Date/Time   PHART 7.498 (H) 01/18/2019 1018   PCO2ART 33.5 01/18/2019 1018   PO2ART 46.0 (L) 01/18/2019 1018   HCO3 26.0  01/18/2019 1018   TCO2 15 (L) 12/31/2021 1024   O2SAT 86.0 01/18/2019 1018     Coagulation Profile: Recent Labs  Lab 12/14/2021 1008  INR >10.0*    Cardiac Enzymes: No results for input(s): "CKTOTAL", "CKMB", "CKMBINDEX", "TROPONINI" in the last 168 hours.  HbA1C: Hgb A1c MFr Bld  Date/Time Value Ref Range Status  10/08/2021 06:16 PM 6.6 (H) 4.8 - 5.6 % Final    Comment:    (NOTE) Pre diabetes:          5.7%-6.4%  Diabetes:              >6.4%  Glycemic control for   <7.0% adults with diabetes   01/15/2019 02:44 PM 7.1 (H) 4.8 -  5.6 % Final    Comment:    (NOTE) Pre diabetes:          5.7%-6.4% Diabetes:              >6.4% Glycemic control for   <7.0% adults with diabetes     CBG: No results for input(s): "GLUCAP" in the last 168 hours.  Review of Systems:   Nausea and vomiting x 3 days   Past Medical History:  She,  has a past medical history of Asthma, Coronary artery disease, CPAP (continuous positive airway pressure) dependence, Diabetes mellitus without complication ([place]), Encounter for care of pacemaker (11/30/2021), Hypertension, Lupus ([place]), Pacemaker: Biotronik Edora Dual chamber Pacemaker 10/13/2021 (11/30/2021), Paroxysmal atrial fibrillation ([place]), Sinus node dysfunction ([place]), and Stroke ([place]).   Surgical History:   Past Surgical History:  Procedure Laterality Date   ATRIAL FIBRILLATION ABLATION     CHOLECYSTECTOMY     PACEMAKER IMPLANT N/A 10/13/2021   Procedure: PACEMAKER IMPLANT;  Surgeon: Vickie Epley, MD;  Location: [facility];  Service: Cardiovascular;  Laterality: N/A;     Social History:   reports that she has never smoked. She has never used smokeless tobacco. She reports that she does not drink alcohol and does not use drugs.   Family History:  Her family history includes Breast cancer in her sister and sister; Stroke in her mother.   Allergies Allergies  Allergen Reactions   Penicillins Hives and Swelling    Did it  involve swelling of the face/tongue/throat, SOB, or low BP? Yes Did it involve sudden or severe rash/hives, skin peeling, or any reaction on the inside of your mouth or nose? No Did you need to seek medical attention at a hospital or doctor's office? No When did it last happen? <less than 10 years      If all above answers are "NO", may proceed with cephalosporin use.      Home Medications  Prior to Admission medications   Medication Sig Start Date End Date Taking? Authorizing Provider  acetaminophen (TYLENOL) 325 MG tablet Take 650 mg by mouth every 6 (six) hours as needed for pain or moderate pain. 01/11/19   [provider]  albuterol (VENTOLIN HFA) 108 (90 Base) MCG/ACT inhaler Inhale 2 puffs into the lungs every 6 (six) hours as needed for wheezing or shortness of breath.    [provider]  amLODipine (NORVASC) 5 MG tablet Take 1 tablet (5 mg total) by mouth daily. 10/28/21 10/23/22  Cantwell, Celeste C, PA-C  atorvastatin (LIPITOR) 80 MG tablet Take 80 mg by mouth daily. 07/05/21   [provider]  azaTHIOprine (IMURAN) 50 MG tablet Take 175 mg by mouth daily at 12 noon. Three tablets and a half once a day 01/11/19   [provider]  Dulaglutide (TRULICITY) 2.35 TD/3.2KG SOPN Inject 0.75 mg into the skin every 14 (fourteen) days.    [provider]  Fluticasone-Umeclidin-Vilant (TRELEGY ELLIPTA) 200-62.5-25 MCG/ACT AEPB Inhale 1 puff into the lungs daily as needed (sob). 09/09/21   [provider]  glucose blood test strip Use as instructed 08/02/18   Patriciaann Clan, DO  insulin NPH Human (NOVOLIN N) 100 UNIT/ML injection INJECT 20 UNITS WITH BREAKFAST  DAILY 09/24/21   [provider]  insulin NPH-regular Human (70-30) 100 UNIT/ML injection 28 units with breakfast, 15 units with dinner Patient taking differently: 20 Units every evening. 08/02/18   Patriciaann Clan, DO  Insulin Syringe-Needle U-100 30G X 5/16" 0.3  ML MISC 2 each  by Does not apply route 2 (two) times daily. 08/02/18   Patriciaann Clan, DO  lisinopril (PRINIVIL,ZESTRIL) 40 MG tablet Take 1 tablet (40 mg total) by mouth daily. 08/02/18   Patriciaann Clan, DO  ondansetron (ZOFRAN-ODT) 4 MG disintegrating tablet Take 4 mg by mouth every 8 (eight) hours as needed for vomiting or nausea. 06/26/21   [provider]  rivaroxaban (XARELTO) 20 MG TABS tablet Take 1 tablet (20 mg total) by mouth daily with supper. 10/18/21 01/16/22  Rex Kras, DO     Critical care time: 82 minutes    Magdalen Spatz, MSN, AGACNP-BC [place] for personal pager PCCM on call pager [phone]  01/06/2022 2:51 PM

## 2021-12-15 NOTE — ED Notes (Signed)
Ultrasound at bedside

## 2021-12-15 NOTE — Progress Notes (Signed)
eLink Physician-Brief Progress Note [name] DOB: 1955-08-23 MRN: [id number]   Date of Service  12/20/2021  HPI/Events of Note  Notified of patient complaint of abdominal pain graded 7/10.  Pt just returned from CT.  No evidence of ischemic colitis. There is wall thickening of the cecum and proximal ascending colin favoring mild colitis. No definite inflammation to suggest ischemic colitis.  There is colonic diverticulosis.  Hiatal hernia.  Peripancreatic edema.   eICU Interventions  Give dilaudid IV for pain control.  Recheck BMP, lactate.       Intervention Category Intermediate Interventions: Pain - evaluation and management  Larinda Buttery 12/16/2021, 8:36 PM

## 2021-12-15 NOTE — ED Notes (Signed)
Unable to give UA at this time. Refused in and out cath. Will continue to monitor.

## 2021-12-15 NOTE — ED Provider Notes (Signed)
[facility] EMERGENCY DEPARTMENT Provider Note   CSN: [id number] Arrival date & time: 12/13/2021  0762     History {Add pertinent medical, surgical, social history, OB history to HPI:1} Chief Complaint  Patient presents with   Abdominal Pain    Stacey Burns is a 66 y.o. female.  Patient presents with 4 days of nausea vomiting diarrhea patient has a history of lupus diabetes hypertension coronary artery disease atrial fibs.  She is presently taking Imuran for lupus and Xarelto for the A-fib  The history is provided by the patient and medical records. No language interpreter was used.  Abdominal Pain Pain location:  Generalized Pain quality: aching   Pain radiates to:  Does not radiate Pain severity:  Moderate Onset quality:  Sudden Timing:  Constant Progression:  Waxing and waning Chronicity:  New Context: not alcohol use   Relieved by:  Nothing Worsened by:  Nothing Associated symptoms: constipation and vomiting   Associated symptoms: no chest pain, no cough, no diarrhea, no fatigue and no hematuria        Home Medications Prior to Admission medications   Medication Sig Start Date End Date Taking? Authorizing Provider  acetaminophen (TYLENOL) 325 MG tablet Take 650 mg by mouth every 6 (six) hours as needed for pain or moderate pain. 01/11/19   [provider]  albuterol (VENTOLIN HFA) 108 (90 Base) MCG/ACT inhaler Inhale 2 puffs into the lungs every 6 (six) hours as needed for wheezing or shortness of breath.    [provider]  amLODipine (NORVASC) 5 MG tablet Take 1 tablet (5 mg total) by mouth daily. 10/28/21 10/23/22  Cantwell, Celeste C, PA-C  atorvastatin (LIPITOR) 80 MG tablet Take 80 mg by mouth daily. 07/05/21   [provider]  azaTHIOprine (IMURAN) 50 MG tablet Take 175 mg by mouth daily at 12 noon. Three tablets and a half once a day 01/11/19   [provider]  Dulaglutide (TRULICITY) 0.75 MG/0.5ML SOPN  Inject 0.75 mg into the skin every 14 (fourteen) days.    [provider]  Fluticasone-Umeclidin-Vilant (TRELEGY ELLIPTA) 200-62.5-25 MCG/ACT AEPB Inhale 1 puff into the lungs daily as needed (sob). 09/09/21   [provider]  glucose blood test strip Use as instructed 08/02/18   Allayne Stack, DO  insulin NPH Human (NOVOLIN N) 100 UNIT/ML injection INJECT 20 UNITS WITH BREAKFAST  DAILY 09/24/21   [provider]  insulin NPH-regular Human (70-30) 100 UNIT/ML injection 28 units with breakfast, 15 units with dinner Patient taking differently: 20 Units every evening. 08/02/18   Allayne Stack, DO  Insulin Syringe-Needle U-100 30G X 5/16" 0.3 ML MISC 2 each by Does not apply route 2 (two) times daily. 08/02/18   Allayne Stack, DO  lisinopril (PRINIVIL,ZESTRIL) 40 MG tablet Take 1 tablet (40 mg total) by mouth daily. 08/02/18   Allayne Stack, DO  ondansetron (ZOFRAN-ODT) 4 MG disintegrating tablet Take 4 mg by mouth every 8 (eight) hours as needed for vomiting or nausea. 06/26/21   [provider]  rivaroxaban (XARELTO) 20 MG TABS tablet Take 1 tablet (20 mg total) by mouth daily with supper. 10/18/21 01/16/22  Tolia, Sunit, DO      Allergies    Penicillins    Review of Systems   Review of Systems  Constitutional:  Negative for appetite change and fatigue.  HENT:  Negative for congestion, ear discharge and sinus pressure.   Eyes:  Negative for discharge.  Respiratory:  Negative for cough.   Cardiovascular:  Negative for chest pain.  Gastrointestinal:  Positive for abdominal pain, constipation and vomiting. Negative for diarrhea.  Genitourinary:  Negative for frequency and hematuria.  Musculoskeletal:  Negative for back pain.  Skin:  Negative for rash.  Neurological:  Negative for seizures and headaches.  Psychiatric/Behavioral:  Negative for hallucinations.     Physical Exam Updated Vital Signs BP (!) 99/44   Pulse 72   Temp 97.7 F (36.5 C)  (Axillary)   Resp 15   Ht 5\' 4"  (1.626 m)   Wt 113.4 kg   SpO2 98%   BMI 42.91 kg/m  Physical Exam Vitals and nursing note reviewed.  Constitutional:      Appearance: She is well-developed.  HENT:     Head: Normocephalic.     Mouth/Throat:     Mouth: Mucous membranes are moist.  Eyes:     General: No scleral icterus.    Conjunctiva/sclera: Conjunctivae normal.  Neck:     Thyroid: No thyromegaly.  Cardiovascular:     Rate and Rhythm: Normal rate and regular rhythm.     Heart sounds: No murmur heard.    No friction rub. No gallop.  Pulmonary:     Breath sounds: No stridor. No wheezing or rales.  Chest:     Chest wall: No tenderness.  Abdominal:     General: There is no distension.     Tenderness: There is abdominal tenderness. There is no rebound.  Genitourinary:    Comments: Heme positive rectal exam brown stool Musculoskeletal:        General: Normal range of motion.     Cervical back: Neck supple.  Lymphadenopathy:     Cervical: No cervical adenopathy.  Skin:    Findings: No erythema or rash.  Neurological:     Mental Status: She is alert and oriented to person, place, and time.     Motor: No abnormal muscle tone.     Coordination: Coordination normal.  Psychiatric:        Behavior: Behavior normal.     ED Results / Procedures / Treatments   Labs (all labs ordered are listed, but only abnormal results are displayed) Labs Reviewed  LACTIC ACID, PLASMA - Abnormal; Notable for the following components:      Result Value   Lactic Acid, Venous 5.5 (*)    All other components within normal limits  LACTIC ACID, PLASMA - Abnormal; Notable for the following components:   Lactic Acid, Venous 6.2 (*)    All other components within normal limits  COMPREHENSIVE METABOLIC PANEL - Abnormal; Notable for the following components:   Potassium 5.8 (*)    Chloride 112 (*)    CO2 13 (*)    BUN 42 (*)    Creatinine, Ser 5.07 (*)    Calcium 7.2 (*)    Total Protein 4.8  (*)    Albumin 1.6 (*)    AST 962 (*)    ALT 396 (*)    Alkaline Phosphatase 243 (*)    Total Bilirubin 3.8 (*)    GFR, Estimated 9 (*)    All other components within normal limits  CBC WITH DIFFERENTIAL/PLATELET - Abnormal; Notable for the following components:   RBC 2.97 (*)    Hemoglobin 10.5 (*)    HCT 31.6 (*)    MCV 106.4 (*)    MCH 35.4 (*)    RDW 20.9 (*)    Platelets 95 (*)    nRBC 1.9 (*)  All other components within normal limits  PROTIME-INR - Abnormal; Notable for the following components:   Prothrombin Time >90.0 (*)    INR >10.0 (*)    All other components within normal limits  APTT - Abnormal; Notable for the following components:   aPTT 70 (*)    All other components within normal limits  I-STAT CHEM 8, ED - Abnormal; Notable for the following components:   Potassium 5.5 (*)    BUN 51 (*)    Creatinine, Ser 5.60 (*)    Calcium, Ion 0.86 (*)    TCO2 15 (*)    Hemoglobin 11.2 (*)    HCT 33.0 (*)    All other components within normal limits  RESP PANEL BY RT-PCR (FLU A&B, COVID) ARPGX2  CULTURE, BLOOD (ROUTINE X 2)  CULTURE, BLOOD (ROUTINE X 2)  URINE CULTURE  URINALYSIS, ROUTINE W REFLEX MICROSCOPIC  LIPASE, BLOOD  POC OCCULT BLOOD, ED  TYPE AND SCREEN    EKG EKG Interpretation  Date/Time:  Monday 01-02-2022 08:59:47 EDT Ventricular Rate:  75 PR Interval:  181 QRS Duration: 99 QT Interval:  419 QTC Calculation: 468 R Axis:   17 Text Interpretation: Sinus rhythm Left atrial enlargement Confirmed by Bethann Berkshire [phone]) on 02-Jan-2022 9:37:36 AM  Radiology CT CHEST ABDOMEN PELVIS WO CONTRAST  Result Date: 01/02/2022 CLINICAL DATA:  Abnormal chest radiograph. Patient's symptoms including abdominal pain and possible sepsis. EXAM: CT CHEST, ABDOMEN AND PELVIS WITHOUT CONTRAST TECHNIQUE: Multidetector CT imaging of the chest, abdomen and pelvis was performed following the standard protocol without IV contrast. RADIATION DOSE REDUCTION: This exam  was performed according to the departmental dose-optimization program which includes automated exposure control, adjustment of the mA and/or kV according to patient size and/or use of iterative reconstruction technique. COMPARISON:  Chest radiograph of 01-02-2022. Chest abdomen and pelvic CTs of 01/15/2019. FINDINGS: CT CHEST FINDINGS Cardiovascular: Right atrial and ventricular pacer. Aortic atherosclerosis. Mild cardiomegaly, without pericardial effusion. Lad coronary artery calcification. Mediastinum/Nodes: No mediastinal or definite hilar adenopathy, given limitations of unenhanced CT. Large hiatal hernia, with nearly the entire stomach positioned in the low right chest. Similar in size to on the prior. No complicating obstruction. Increased density within the herniated fat is mild, including on 40/3. Lungs/Pleura: Trace right pleural fluid is similar. Right lower lobe dependent atelectasis and volume loss, slightly increased since 2020. No lobar consolidation. Musculoskeletal: No acute osseous abnormality. CT ABDOMEN PELVIS FINDINGS Hepatobiliary: Normal noncontrast appearance of the liver. Cholecystectomy, without biliary ductal dilatation. Pancreas: No pancreatic duct dilatation. Subtle edema within the peripancreatic fat including on 54/3. Spleen: Normal in size, without focal abnormality. Adrenals/Urinary Tract: Normal adrenal glands. No renal calculi or hydronephrosis. No hydroureter or ureteric calculi. No bladder calculi. Stomach/Bowel: Primarily herniated into the lower chest, as detailed above. Scattered colonic diverticula. Suspect somewhat more focal edema adjacent the ascending colon including on 72/3. Normal terminal ileum and appendix.  Normal small bowel. Vascular/Lymphatic: Aortic atherosclerosis. No abdominopelvic adenopathy. Reproductive: Calcified uterine fibroids. Other: No significant free fluid.  No free intraperitoneal air. Musculoskeletal: Osteopenia.  Degenerate disc disease at L4-5.  IMPRESSION: 1. Decreased sensitivity exam secondary to lack of oral or IV contrast. 2. Relatively diffuse intraabdominal edema, decreasing sensitivity for focal areas of inflammation. 3. Large hiatal hernia, with the majority of the stomach positioned in the lower chest. Subtle edema within the herniated fat is nonspecific, especially given above diffuse edema. Correlate with symptoms of inflammation or even ischemia of the herniated stomach. 4. Similarly,  mild peripancreatic edema for which pancreatitis cannot be excluded. 5. More focal edema about the ascending colon could represent colitis or (given scattered diverticula) diverticulitis. 6. Small volume uterine fibroids. 7. Coronary artery atherosclerosis. Aortic Atherosclerosis (ICD10-I70.0). Electronically Signed   By: Jeronimo Greaves M.D.   On: 2021-12-23 12:04   DG Chest Port 1 View  Result Date: 12-23-21 CLINICAL DATA:  Abdominal pain, possible sepsis EXAM: PORTABLE CHEST 1 VIEW COMPARISON:  10/14/2021 FINDINGS: Dual lead pacer noted. Increased appreciable prominence of abnormal right retrocardiac and pericardiac density, most likely attributable to the large hiatal hernia shown on 01/15/2019. Atherosclerotic calcification of the aortic arch. Underlying mild to moderate enlargement of the cardiopericardial silhouette. The lungs appear otherwise clear. IMPRESSION: 1. Increased prominence of right retrocardiac density. While most likely attributable to the patient's large right eccentric hiatal hernia, I cannot exclude superimposed pneumonia or underlying lesion medially in the right lung base. This could be further characterized with chest CT if clinically warranted. 2.  Aortic Atherosclerosis (ICD10-I70.0). 3. At least mild enlargement of the cardiopericardial silhouette, without pulmonary edema. 4. Dual lead pacer. Electronically Signed   By: Gaylyn Rong M.D.   On: 2021-12-23 09:34    Procedures Procedures  {Document cardiac monitor, telemetry  assessment procedure when appropriate:1}  Medications Ordered in ED Medications  levofloxacin (LEVAQUIN) IVPB 500 mg (has no administration in time range)  lactated ringers infusion ( Intravenous New Bag/Given 12-23-2021 1245)  levofloxacin (LEVAQUIN) IVPB 750 mg (0 mg Intravenous Stopped 2021-12-23 1243)  metroNIDAZOLE (FLAGYL) IVPB 500 mg (0 mg Intravenous Stopped 23-Dec-2021 1140)  sodium chloride 0.9 % bolus 2,000 mL (0 mLs Intravenous Stopped Dec 23, 2021 1140)  dicyclomine (BENTYL) injection 20 mg (20 mg Intramuscular Given 2021/12/23 1002)  pantoprazole (PROTONIX) injection 40 mg (40 mg Intravenous Given 12/23/2021 1002)  sodium chloride 0.9 % bolus 1,000 mL (0 mLs Intravenous Stopped 23-Dec-2021 1140)  lactated ringers bolus 1,000 mL (0 mLs Intravenous Stopped 23-Dec-2021 1244)    ED Course/ Medical Decision Making/ A&P  CRITICAL CARE Performed by: Bethann Berkshire Total critical care time: 50 minutes Critical care time was exclusive of separately billable procedures and treating other patients. Critical care was necessary to treat or prevent imminent or life-threatening deterioration. Critical care was time spent personally by me on the following activities: development of treatment plan with patient and/or surrogate as well as nursing, discussions with consultants, evaluation of patient's response to treatment, examination of patient, obtaining history from patient or surrogate, ordering and performing treatments and interventions, ordering and review of laboratory studies, ordering and review of radiographic studies, pulse oximetry and re-evaluation of patient's condition.                          Medical Decision Making Amount and/or Complexity of Data Reviewed Labs: ordered. Radiology: ordered. ECG/medicine tests: ordered.  Risk Prescription drug management. Decision regarding hospitalization.   Patient with sepsis and colitis.  Also renal failure.  Critical care will admit and nephrology has been  consulted  {Document critical care time when appropriate:1} {Document review of labs and clinical decision tools ie heart score, Chads2Vasc2 etc:1}  {Document your independent review of radiology images, and any outside records:1} {Document your discussion with family members, caretakers, and with consultants:1} {Document social determinants of health affecting pt's care:1} {Document your decision making why or why not admission, treatments were needed:1} Final Clinical Impression(s) / ED Diagnoses Final diagnoses:  Acute sepsis (HCC)    Rx / DC Orders  ED Discharge Orders     None

## 2021-12-15 NOTE — Progress Notes (Signed)
Pt does not wear cpap at home and does not wish to wear one here.  This was also confirmed by family at the bedside.  RT will continue to monitor.

## 2021-12-15 NOTE — Procedures (Signed)
Arterial Line Insertion Start/End7/08/2021 11:36 PM  Patient location: ICU. Preanesthetic checklist: patient identified, risks and benefits discussed, surgical consent, monitors and equipment checked and timeout performed Left, radial was placed Catheter size: 20 G Hand hygiene performed  and maximum sterile barriers used  Allen's test indicative of satisfactory collateral circulation Attempts: 2 Procedure performed without using ultrasound guided technique. Following insertion, dressing applied and Biopatch. Post procedure assessment: normal  Patient tolerated the procedure well with no immediate complications.

## 2021-12-15 NOTE — Progress Notes (Signed)
Date and time results received: 12/30/2021 1752 (use smartphrase ".now" to insert current time)  Test: INR Critical Value: >10  Name of Provider Notified: Dr. Violeta Gelinas aware  Orders Received? Or Actions Taken?:  MD aware

## 2021-12-15 NOTE — Progress Notes (Addendum)
eLink Physician-Brief Progress Note [name] DOB: [birth year] MRN: [id number]   Date of Service  01/06/2022  HPI/Events of Note  Notified of MAP less than 65 but SBP >90.  Pt also difficult stick and labs could not be drawn.   eICU Interventions  Insert arterial line.  If MAPs <65, start on levophed peripherally.  Draw labs to follow up lactate and K.      Intervention Category Intermediate Interventions: Hypotension - evaluation and management  Larinda Buttery 12/13/2021, 10:03 PM  11:55 PM Arterial line Bp at 81/35.  Plan> Start on levophed peripherally to keep MAP >65.

## 2021-12-15 NOTE — Consult Note (Signed)
Reason for Consult:possible ischemic bowel Referring Physician:  HospitalMatt Hunsucker  Stacey Burns is an 66 y.o. female.  HPI: 66yo F with PMHx of CAD, DM, HTN, PAF on Xarelto, recent pacemaker placement, OSA, and lupus on Imuran was admitted to critical care today C/O 3d HX abdominal pain and more frequent BMs than usual. CT A/P without contrast shows some focal edema in the ascending colon suspicious for colitis or diverticulitis. She also has a very large hiatal hernia with most of her stomach in the lower chest. There is subtle edema here. She complains of abdominal pain currently. I spoke with her son at the bedside and with her brother who is a physician per her request by phone.  Past Medical History:  Diagnosis Date   Asthma    Coronary artery disease    CPAP (continuous positive airway pressure) dependence    Diabetes mellitus without complication (HCC)    Encounter for care of pacemaker 11/30/2021   Hypertension    Lupus (HCC)    Pacemaker: Biotronik Edora Dual chamber Pacemaker 10/13/2021 11/30/2021   Paroxysmal atrial fibrillation (HCC)    Sinus node dysfunction (HCC)    Stroke [facility](HCC)     Past Surgical History:  Procedure Laterality Date   ATRIAL FIBRILLATION ABLATION     CHOLECYSTECTOMY     PACEMAKER IMPLANT N/A 10/13/2021   Procedure: PACEMAKER IMPLANT;  Surgeon: Lanier PrudeLambert, Cameron T, MD;  Location: [facility] INVAS[facility] LAB;  Service: Cardiovascular;  Laterality: N/A;    Family History  Problem Relation Age of Onset   Stroke Mother        blood clot in brain   Breast cancer Sister    Breast cancer Sister     Social History:  reports that she has never smoked. She has never used smokeless tobacco. She reports that she does not drink alcohol and does not use drugs.  Allergies:  Allergies  Allergen Reactions   Penicillins Hives and Swelling    Did it involve swelling of the face/tongue/throat, SOB, or low BP? Yes Did it involve sudden or severe rash/hives, skin peeling, or  any reaction on the inside of your mouth or nose? No Did you need to seek medical attention at a hospital or doctor's office? No When did it last happen? <less than 10 years      If all above answers are "NO", may proceed with cephalosporin use.     Medications: I have reviewed the patient's current medications.  Results for orders placed or performed during the hospital encounter of 01/08/2022 (from the past 48 hour(s))  Resp Panel by RT-PCR (Flu A&B, Covid) Anterior Nasal Swab     Status: None   Collection Time: 01/09/2022  9:14 AM   Specimen: Anterior Nasal Swab  Result Value Ref Range   SARS Coronavirus 2 by RT PCR NEGATIVE NEGATIVE    Comment: (NOTE) SARS-CoV-2 target nucleic acids are NOT DETECTED.  The SARS-CoV-2 RNA is generally detectable in upper respiratory specimens during the acute phase of infection. The lowest concentration of SARS-CoV-2 viral copies this assay can detect is 138 copies/mL. A negative result does not preclude SARS-Cov-2 infection and should not be used as the sole basis for treatment or other patient management decisions. A negative result may occur with  improper specimen collection/handling, submission of specimen other than nasopharyngeal swab, presence of viral mutation(s) within the areas targeted by this assay, and inadequate number of viral copies(<138 copies/mL). A negative result must be combined with clinical observations, patient history,  and epidemiological information. The expected result is Negative.  Fact Sheet for Patients:  BloggerCourse.com  Fact Sheet for Healthcare Providers:  SeriousBroker.it  This test is no t yet approved or cleared by the [place] FDA and  has been authorized for detection and/or diagnosis of SARS-CoV-2 by FDA under an Emergency Use Authorization (EUA). This EUA will remain  in effect (meaning this test can be used) for the duration of the COVID-19  declaration under Section 564(b)(1) of the Act, 21 U.S.C.section 360bbb-3(b)(1), unless the authorization is terminated  or revoked sooner.       Influenza A by PCR NEGATIVE NEGATIVE   Influenza B by PCR NEGATIVE NEGATIVE    Comment: (NOTE) The Xpert Xpress SARS-CoV-2/FLU/RSV plus assay is intended as an aid in the diagnosis of influenza from Nasopharyngeal swab specimens and should not be used as a sole basis for treatment. Nasal washings and aspirates are unacceptable for Xpert Xpress SARS-CoV-2/FLU/RSV testing.  Fact Sheet for Patients: BloggerCourse.com  Fact Sheet for Healthcare Providers: SeriousBroker.it  This test is not yet approved or cleared by the [place] FDA and has been authorized for detection and/or diagnosis of SARS-CoV-2 by FDA under an Emergency Use Authorization (EUA). This EUA will remain in effect (meaning this test can be used) for the duration of the COVID-19 declaration under Section 564(b)(1) of the Act, 21 U.S.C. section 360bbb-3(b)(1), unless the authorization is terminated or revoked.  Performed at [facility], [address], [place], [place] [address]   Lactic acid, plasma     Status: Abnormal   Collection Time: 12/22/2021 10:08 AM  Result Value Ref Range   Lactic Acid, Venous 5.5 (HH) 0.5 - 1.9 mmol/L    Comment: CRITICAL RESULT CALLED TO, READ BACK BY AND VERIFIED WITH: K.YONGQUE,RN 01/09/2022 AT 1122 AHUGHES Performed at [facility], [address], [place], [place] [address]   Comprehensive metabolic panel     Status: Abnormal   Collection Time: 01/04/2022 10:08 AM  Result Value Ref Range   Sodium 137 135 - 145 mmol/L   Potassium 5.8 (H) 3.5 - 5.1 mmol/L   Chloride 112 (H) 98 - 111 mmol/L   CO2 13 (L) 22 - 32 mmol/L   Glucose, Bld 89 70 - 99 mg/dL    Comment: Glucose reference range applies only to samples taken after fasting for at least 8 hours.   BUN 42 (H) 8 - 23  mg/dL   Creatinine, Ser 2.95 (H) 0.44 - 1.00 mg/dL   Calcium 7.2 (L) 8.9 - 10.3 mg/dL   Total Protein 4.8 (L) 6.5 - 8.1 g/dL   Albumin 1.6 (L) 3.5 - 5.0 g/dL   AST 621 (H) 15 - 41 U/L   ALT 396 (H) 0 - 44 U/L   Alkaline Phosphatase 243 (H) 38 - 126 U/L   Total Bilirubin 3.8 (H) 0.3 - 1.2 mg/dL   GFR, Estimated 9 (L) >60 mL/min    Comment: (NOTE) Calculated using the CKD-EPI Creatinine Equation (2021)    Anion gap 12 5 - 15    Comment: Performed at [facility], [address], [place], [place] [address]  CBC with Differential     Status: Abnormal   Collection Time: 12/14/2021 10:08 AM  Result Value Ref Range   WBC 4.8 4.0 - 10.5 K/uL   RBC 2.97 (L) 3.87 - 5.11 MIL/uL   Hemoglobin 10.5 (L) 12.0 - 15.0 g/dL   HCT 78.4 (L) 69.6 - 29.5 %  MCV 106.4 (H) 80.0 - 100.0 fL   MCH 35.4 (H) 26.0 - 34.0 pg   MCHC 33.2 30.0 - 36.0 g/dL   RDW 85.4 (H) 62.7 - 03.5 %   Platelets 95 (L) 150 - 400 K/uL    Comment: REPEATED TO VERIFY   nRBC 1.9 (H) 0.0 - 0.2 %   Neutrophils Relative % 76 %   Neutro Abs 3.6 1.7 - 7.7 K/uL   Lymphocytes Relative 14 %   Lymphs Abs 0.7 0.7 - 4.0 K/uL   Monocytes Relative 9 %   Monocytes Absolute 0.4 0.1 - 1.0 K/uL   Eosinophils Relative 0 %   Eosinophils Absolute 0.0 0.0 - 0.5 K/uL   Basophils Relative 0 %   Basophils Absolute 0.0 0.0 - 0.1 K/uL   Immature Granulocytes 1 %   Abs Immature Granulocytes 0.05 0.00 - 0.07 K/uL    Comment: Performed at [facility], [address], [place], [place] [address]  Protime-INR     Status: Abnormal   Collection Time: 11-Jan-2022 10:08 AM  Result Value Ref Range   Prothrombin Time >90.0 (H) 11.4 - 15.2 seconds   INR >10.0 (HH) 0.8 - 1.2    Comment: REPEATED TO VERIFY CRITICAL RESULT CALLED TO, READ BACK BY AND VERIFIED WITH: Rhona Leavens AT 1203 2022/01/11 BY ZBEECH. (NOTE) INR goal varies based on device and disease states. Performed at [facility], [address], [place], [place] [address]    APTT     Status: Abnormal   Collection Time: 01-11-22 10:08 AM  Result Value Ref Range   aPTT 70 (H) 24 - 36 seconds    Comment:        IF BASELINE aPTT IS ELEVATED, SUGGEST PATIENT RISK ASSESSMENT BE USED TO DETERMINE APPROPRIATE ANTICOAGULANT THERAPY. Performed at [facility], [address], [place], [place] [address]   Type and screen     Status: None   Collection Time: 01/11/22 10:08 AM  Result Value Ref Range   ABO/RH(D) B POS    Antibody Screen NEG    Sample Expiration      12/18/2021,2359 Performed at [facility], [address], [place], [place] [address]   Lipase, blood     Status: Abnormal   Collection Time: 2022-01-11 10:08 AM  Result Value Ref Range   Lipase 73 (H) 11 - 51 U/L    Comment: Performed at [facility], [address], [place], [place] [address]  I-stat chem 8, ED (not at [facility] or [facility])     Status: Abnormal   Collection Time: 01/11/2022 10:24 AM  Result Value Ref Range   Sodium 137 135 - 145 mmol/L   Potassium 5.5 (H) 3.5 - 5.1 mmol/L   Chloride 110 98 - 111 mmol/L   BUN 51 (H) 8 - 23 mg/dL   Creatinine, Ser 5.10 (H) 0.44 - 1.00 mg/dL   Glucose, Bld 81 70 - 99 mg/dL    Comment: Glucose reference range applies only to samples taken after fasting for at least 8 hours.   Calcium, Ion 0.86 (LL) 1.15 - 1.40 mmol/L   TCO2 15 (L) 22 - 32 mmol/L   Hemoglobin 11.2 (L) 12.0 - 15.0 g/dL   HCT 25.8 (L) 52.7 - 78.2 %   Comment NOTIFIED PHYSICIAN   Lactic acid, plasma     Status: Abnormal   Collection Time: 01/11/22 12:03 PM  Result Value Ref Range   Lactic Acid, Venous 6.2 (  HH) 0.5 - 1.9 mmol/L    Comment: CRITICAL VALUE NOTED.  VALUE IS CONSISTENT WITH PREVIOUSLY REPORTED AND CALLED VALUE. Performed at [facility], [address] [place], [place] [address]   Lactic acid, plasma     Status: Abnormal   Collection Time: Jan 02, 2022  3:51 PM  Result Value Ref Range   Lactic Acid, Venous 8.0 (HH) 0.5 - 1.9 mmol/L    Comment: CRITICAL  VALUE NOTED.  VALUE IS CONSISTENT WITH PREVIOUSLY REPORTED AND CALLED VALUE. Performed at [facility], [address], [place], [place] [address]   Lipase, blood     Status: Abnormal   Collection Time: 01-02-2022  3:51 PM  Result Value Ref Range   Lipase 75 (H) 11 - 51 U/L    Comment: Performed at [facility], [address], [place], [place] [address]  Magnesium     Status: None   Collection Time: 02-Jan-2022  3:51 PM  Result Value Ref Range   Magnesium 2.1 1.7 - 2.4 mg/dL    Comment: Performed at [facility], [address] [place], [place] [address]  Basic metabolic panel     Status: Abnormal   Collection Time: 01-02-2022  3:51 PM  Result Value Ref Range   Sodium 138 135 - 145 mmol/L   Potassium 5.8 (H) 3.5 - 5.1 mmol/L    Comment: SLIGHT HEMOLYSIS   Chloride 114 (H) 98 - 111 mmol/L   CO2 11 (L) 22 - 32 mmol/L   Glucose, Bld 89 70 - 99 mg/dL    Comment: Glucose reference range applies only to samples taken after fasting for at least 8 hours.   BUN 39 (H) 8 - 23 mg/dL   Creatinine, Ser 8.58 (H) 0.44 - 1.00 mg/dL   Calcium 6.9 (L) 8.9 - 10.3 mg/dL   GFR, Estimated 9 (L) >60 mL/min    Comment: (NOTE) Calculated using the CKD-EPI Creatinine Equation (2021)    Anion gap 13 5 - 15    Comment: Performed at [facility], [address] [place], [place] [address]  Glucose, capillary     Status: None   Collection Time: 2022-01-02  4:13 PM  Result Value Ref Range   Glucose-Capillary 75 70 - 99 mg/dL    Comment: Glucose reference range applies only to samples taken after fasting for at least 8 hours.  Glucose, capillary     Status: None   Collection Time: 01/02/2022  4:46 PM  Result Value Ref Range   Glucose-Capillary 86 70 - 99 mg/dL    Comment: Glucose reference range applies only to samples taken after fasting for at least 8 hours.    US Abdomen Limited RUQ (LIVER/GB)  Result Date: 01-02-22 CLINICAL DATA:  Elevated total bilirubin levels. EXAM: ULTRASOUND  ABDOMEN LIMITED RIGHT UPPER QUADRANT COMPARISON:  CT from earlier today FINDINGS: Gallbladder: Status post cholecystectomy. Common bile duct: Diameter: 3.4 mm.  No signs of intrahepatic bile duct dilatation. Liver: No focal lesion identified. Within normal limits in parenchymal echogenicity. Portal vein is patent on color Doppler imaging with normal direction of blood flow towards the liver. Other: Exam detail diminished secondary to body habitus. IMPRESSION: 1. No acute findings.  No bile duct dilatation identified. 2. Status post cholecystectomy. Electronically Signed   By: Signa Kell M.D.   On: 01/02/2022 14:45   CT CHEST ABDOMEN PELVIS WO CONTRAST  Result Date: 01-02-22 CLINICAL DATA:  Abnormal chest radiograph. Patient's symptoms including abdominal pain  and possible sepsis. EXAM: CT CHEST, ABDOMEN AND PELVIS WITHOUT CONTRAST TECHNIQUE: Multidetector CT imaging of the chest, abdomen and pelvis was performed following the standard protocol without IV contrast. RADIATION DOSE REDUCTION: This exam was performed according to the departmental dose-optimization program which includes automated exposure control, adjustment of the mA and/or kV according to patient size and/or use of iterative reconstruction technique. COMPARISON:  Chest radiograph of 02-Jan-2022. Chest abdomen and pelvic CTs of 01/15/2019. FINDINGS: CT CHEST FINDINGS Cardiovascular: Right atrial and ventricular pacer. Aortic atherosclerosis. Mild cardiomegaly, without pericardial effusion. Lad coronary artery calcification. Mediastinum/Nodes: No mediastinal or definite hilar adenopathy, given limitations of unenhanced CT. Large hiatal hernia, with nearly the entire stomach positioned in the low right chest. Similar in size to on the prior. No complicating obstruction. Increased density within the herniated fat is mild, including on 40/3. Lungs/Pleura: Trace right pleural fluid is similar. Right lower lobe dependent atelectasis and volume loss,  slightly increased since 2020. No lobar consolidation. Musculoskeletal: No acute osseous abnormality. CT ABDOMEN PELVIS FINDINGS Hepatobiliary: Normal noncontrast appearance of the liver. Cholecystectomy, without biliary ductal dilatation. Pancreas: No pancreatic duct dilatation. Subtle edema within the peripancreatic fat including on 54/3. Spleen: Normal in size, without focal abnormality. Adrenals/Urinary Tract: Normal adrenal glands. No renal calculi or hydronephrosis. No hydroureter or ureteric calculi. No bladder calculi. Stomach/Bowel: Primarily herniated into the lower chest, as detailed above. Scattered colonic diverticula. Suspect somewhat more focal edema adjacent the ascending colon including on 72/3. Normal terminal ileum and appendix.  Normal small bowel. Vascular/Lymphatic: Aortic atherosclerosis. No abdominopelvic adenopathy. Reproductive: Calcified uterine fibroids. Other: No significant free fluid.  No free intraperitoneal air. Musculoskeletal: Osteopenia.  Degenerate disc disease at L4-5. IMPRESSION: 1. Decreased sensitivity exam secondary to lack of oral or IV contrast. 2. Relatively diffuse intraabdominal edema, decreasing sensitivity for focal areas of inflammation. 3. Large hiatal hernia, with the majority of the stomach positioned in the lower chest. Subtle edema within the herniated fat is nonspecific, especially given above diffuse edema. Correlate with symptoms of inflammation or even ischemia of the herniated stomach. 4. Similarly, mild peripancreatic edema for which pancreatitis cannot be excluded. 5. More focal edema about the ascending colon could represent colitis or (given scattered diverticula) diverticulitis. 6. Small volume uterine fibroids. 7. Coronary artery atherosclerosis. Aortic Atherosclerosis (ICD10-I70.0). Electronically Signed   By: Jeronimo Greaves M.D.   On: January 02, 2022 12:04   DG Chest Port 1 View  Result Date: 01/02/2022 CLINICAL DATA:  Abdominal pain, possible sepsis  EXAM: PORTABLE CHEST 1 VIEW COMPARISON:  10/14/2021 FINDINGS: Dual lead pacer noted. Increased appreciable prominence of abnormal right retrocardiac and pericardiac density, most likely attributable to the large hiatal hernia shown on 01/15/2019. Atherosclerotic calcification of the aortic arch. Underlying mild to moderate enlargement of the cardiopericardial silhouette. The lungs appear otherwise clear. IMPRESSION: 1. Increased prominence of right retrocardiac density. While most likely attributable to the patient's large right eccentric hiatal hernia, I cannot exclude superimposed pneumonia or underlying lesion medially in the right lung base. This could be further characterized with chest CT if clinically warranted. 2.  Aortic Atherosclerosis (ICD10-I70.0). 3. At least mild enlargement of the cardiopericardial silhouette, without pulmonary edema. 4. Dual lead pacer. Electronically Signed   By: Gaylyn Rong M.D.   On: 2022/01/02 09:34    Review of Systems  Constitutional:  Positive for appetite change.  HENT: Negative.    Eyes: Negative.   Respiratory:  Negative for shortness of breath.   Cardiovascular:  Negative for chest pain.  Gastrointestinal:  Positive for abdominal pain and nausea. Negative for blood in stool, diarrhea and vomiting.       Frequent BMs  Endocrine: Negative.   Genitourinary:  Positive for decreased urine volume.  Musculoskeletal: Negative.   Allergic/Immunologic: Negative.   Neurological: Negative.   Hematological: Negative.   Psychiatric/Behavioral: Negative.     Blood pressure (!) 110/45, pulse 75, temperature 98 F (36.7 C), temperature source Oral, resp. rate 19, height  (1.626 m), weight 113.4 kg, SpO2 95 %. Physical Exam Eyes:     Extraocular Movements: Extraocular movements intact.     Pupils: Pupils are equal, round, and reactive to light.  Cardiovascular:     Rate and Rhythm: Normal rate. Rhythm irregular.     Heart sounds: Normal heart sounds.   Pulmonary:     Effort: Pulmonary effort is normal.     Breath sounds: Normal breath sounds. No rhonchi.  Abdominal:     General: Bowel sounds are decreased. There is no distension.     Palpations: Abdomen is soft. There is no mass.     Tenderness: There is abdominal tenderness.     Hernia: No hernia is present.     Comments: Mild R sided tenderness without guarding, palpation of epigastrium yields nausea  Skin:    General: Skin is warm.     Capillary Refill: Capillary refill takes 2 to 3 seconds.  Neurological:     Mental Status: She is alert and oriented to person, place, and time.  Psychiatric:        Mood and Affect: Mood normal.     Assessment/Plan: Adelaida.Potts F with multiple medical problems as above and concern for ischemic bowel. Continue Rocephin and Flagyl as well as fluid resuscitation. A CT angio C/A/P has been ordered and I think that will be very helpful in guiding care. AKI and hepatic dysfunction are concerning. INR>10. She may exploratory laparotomy with possible bowel resection. This would require correction of her severe coagulopathy to proceed. If she were to undergo surgery it would very likely require being on the ventilator and more than one operation. I discussed this with her, her son, and her brother (by phone). I will discuss further with her and her family as well as the CCM team once the CT is done.  Liz Malady 12/26/2021, 5:49 PM

## 2021-12-15 NOTE — ED Notes (Signed)
Unable to draw labs from both IV. Will notify phlebotomist.

## 2021-12-15 NOTE — Progress Notes (Signed)
Pharmacy Antibiotic Note  Stacey Burns is a 67 y.o. female admitted on 12/26/2021 with  Intra-abdominal infection .  Pharmacy has been consulted for Levofloxacin dosing.   Of note, patient reports allergy to penicillins with reaction of swelling and hives. Pt cannot recall how long ago the reaction occurred but stated it was a "long time ago." She cannot recall if she has taken any cephalosporins in the past. Given poor history, will proceed with levofloxacin therapy for treatment of intra-abdominal infection.  SCr 5.6 (b/l SCr ~0.9-1)  Plan: Levofloxacin 750mg  IV x 1, followed by  Levofloxacin 500mg  IV q48h for CrCl < 20 ml/hr Metronidazole per primary provider Continue to monitor renal function, WBC, temp, and clinical s/sx of infection daily Adjust levofloxacin dose if CrCl > 20 ml/hr. F/u cultures and de-escalate as appropriate  Height: 5\' 4"  (162.6 cm) Weight: 113.4 kg (250 lb) IBW/kg (Calculated) : 54.7  Temp (24hrs), Avg:97.7 F (36.5 C), Min:97.7 F (36.5 C), Max:97.7 F (36.5 C)  Recent Labs  Lab 12/18/2021 1024  CREATININE 5.60*    Estimated Creatinine Clearance: 12.2 mL/min (A) (by C-G formula based on SCr of 5.6 mg/dL (H)).    Allergies  Allergen Reactions   Penicillins Hives and Swelling    Did it involve swelling of the face/tongue/throat, SOB, or low BP? Yes Did it involve sudden or severe rash/hives, skin peeling, or any reaction on the inside of your mouth or nose? No Did you need to seek medical attention at a hospital or doctor's office? No When did it last happen? <less than 10 years      If all above answers are "NO", may proceed with cephalosporin use.     Antimicrobials this admission: Levofloxacin 7/3 >>  Metronidazole 7/3 >>   Dose adjustments this admission: N/A  Microbiology results: 7/3 Ccx x2: pending  Thank you for allowing pharmacy to be a part of this patient's care.  12/21/2021 10:41 AM

## 2021-12-16 ENCOUNTER — Inpatient Hospital Stay (HOSPITAL_COMMUNITY): Payer: Medicare Other

## 2021-12-16 DIAGNOSIS — G9340 Encephalopathy, unspecified: Secondary | ICD-10-CM | POA: Diagnosis not present

## 2021-12-16 DIAGNOSIS — J9601 Acute respiratory failure with hypoxia: Secondary | ICD-10-CM | POA: Diagnosis not present

## 2021-12-16 DIAGNOSIS — A419 Sepsis, unspecified organism: Secondary | ICD-10-CM | POA: Diagnosis not present

## 2021-12-16 DIAGNOSIS — E875 Hyperkalemia: Secondary | ICD-10-CM

## 2021-12-16 DIAGNOSIS — R579 Shock, unspecified: Secondary | ICD-10-CM

## 2021-12-16 DIAGNOSIS — R6521 Severe sepsis with septic shock: Secondary | ICD-10-CM

## 2021-12-16 DIAGNOSIS — N179 Acute kidney failure, unspecified: Secondary | ICD-10-CM | POA: Diagnosis not present

## 2021-12-16 DIAGNOSIS — K5792 Diverticulitis of intestine, part unspecified, without perforation or abscess without bleeding: Secondary | ICD-10-CM | POA: Diagnosis not present

## 2021-12-16 LAB — POCT I-STAT 7, (LYTES, BLD GAS, ICA,H+H)
Acid-base deficit: 15 mmol/L — ABNORMAL HIGH (ref 0.0–2.0)
Acid-base deficit: 16 mmol/L — ABNORMAL HIGH (ref 0.0–2.0)
Acid-base deficit: 8 mmol/L — ABNORMAL HIGH (ref 0.0–2.0)
Bicarbonate: 18.3 mmol/L — ABNORMAL LOW (ref 20.0–28.0)
Bicarbonate: 9.1 mmol/L — ABNORMAL LOW (ref 20.0–28.0)
Bicarbonate: 9.9 mmol/L — ABNORMAL LOW (ref 20.0–28.0)
Calcium, Ion: 0.69 mmol/L — CL (ref 1.15–1.40)
Calcium, Ion: 0.82 mmol/L — CL (ref 1.15–1.40)
Calcium, Ion: 0.9 mmol/L — ABNORMAL LOW (ref 1.15–1.40)
HCT: 25 % — ABNORMAL LOW (ref 36.0–46.0)
HCT: 29 % — ABNORMAL LOW (ref 36.0–46.0)
HCT: 32 % — ABNORMAL LOW (ref 36.0–46.0)
Hemoglobin: 10.9 g/dL — ABNORMAL LOW (ref 12.0–15.0)
Hemoglobin: 8.5 g/dL — ABNORMAL LOW (ref 12.0–15.0)
Hemoglobin: 9.9 g/dL — ABNORMAL LOW (ref 12.0–15.0)
O2 Saturation: 100 %
O2 Saturation: 92 %
O2 Saturation: 93 %
Patient temperature: 91
Patient temperature: 97.2
Patient temperature: 97.9
Potassium: 5 mmol/L (ref 3.5–5.1)
Potassium: 5.7 mmol/L — ABNORMAL HIGH (ref 3.5–5.1)
Potassium: 6.5 mmol/L (ref 3.5–5.1)
Sodium: 136 mmol/L (ref 135–145)
Sodium: 137 mmol/L (ref 135–145)
Sodium: 137 mmol/L (ref 135–145)
TCO2: 10 mmol/L — ABNORMAL LOW (ref 22–32)
TCO2: 11 mmol/L — ABNORMAL LOW (ref 22–32)
TCO2: 19 mmol/L — ABNORMAL LOW (ref 22–32)
pCO2 arterial: 20.5 mmHg — ABNORMAL LOW (ref 32–48)
pCO2 arterial: 22 mmHg — ABNORMAL LOW (ref 32–48)
pCO2 arterial: 30.9 mmHg — ABNORMAL LOW (ref 32–48)
pH, Arterial: 7.252 — ABNORMAL LOW (ref 7.35–7.45)
pH, Arterial: 7.261 — ABNORMAL LOW (ref 7.35–7.45)
pH, Arterial: 7.358 (ref 7.35–7.45)
pO2, Arterial: 332 mmHg — ABNORMAL HIGH (ref 83–108)
pO2, Arterial: 69 mmHg — ABNORMAL LOW (ref 83–108)
pO2, Arterial: 73 mmHg — ABNORMAL LOW (ref 83–108)

## 2021-12-16 LAB — BASIC METABOLIC PANEL
Anion gap: 13 (ref 5–15)
Anion gap: 31 — ABNORMAL HIGH (ref 5–15)
BUN: 31 mg/dL — ABNORMAL HIGH (ref 8–23)
BUN: 43 mg/dL — ABNORMAL HIGH (ref 8–23)
CO2: 10 mmol/L — ABNORMAL LOW (ref 22–32)
CO2: 14 mmol/L — ABNORMAL LOW (ref 22–32)
Calcium: 5.7 mg/dL — CL (ref 8.9–10.3)
Calcium: 6.5 mg/dL — ABNORMAL LOW (ref 8.9–10.3)
Chloride: 113 mmol/L — ABNORMAL HIGH (ref 98–111)
Chloride: 94 mmol/L — ABNORMAL LOW (ref 98–111)
Creatinine, Ser: 3.89 mg/dL — ABNORMAL HIGH (ref 0.44–1.00)
Creatinine, Ser: 5.2 mg/dL — ABNORMAL HIGH (ref 0.44–1.00)
GFR, Estimated: 12 mL/min — ABNORMAL LOW (ref >60)
GFR, Estimated: 9 mL/min — ABNORMAL LOW (ref >60)
Glucose, Bld: 150 mg/dL — ABNORMAL HIGH (ref 70–99)
Glucose, Bld: 71 mg/dL (ref 70–99)
Potassium: 4.5 mmol/L (ref 3.5–5.1)
Potassium: 5.7 mmol/L — ABNORMAL HIGH (ref 3.5–5.1)
Sodium: 136 mmol/L (ref 135–145)
Sodium: 139 mmol/L (ref 135–145)

## 2021-12-16 LAB — COMPREHENSIVE METABOLIC PANEL
ALT: 398 U/L — ABNORMAL HIGH (ref 0–44)
AST: 1015 U/L — ABNORMAL HIGH (ref 15–41)
Albumin: 1.8 g/dL — ABNORMAL LOW (ref 3.5–5.0)
Alkaline Phosphatase: 226 U/L — ABNORMAL HIGH (ref 38–126)
Anion gap: 19 — ABNORMAL HIGH (ref 5–15)
BUN: 45 mg/dL — ABNORMAL HIGH (ref 8–23)
CO2: 10 mmol/L — ABNORMAL LOW (ref 22–32)
Calcium: 6.8 mg/dL — ABNORMAL LOW (ref 8.9–10.3)
Chloride: 109 mmol/L (ref 98–111)
Creatinine, Ser: 5.48 mg/dL — ABNORMAL HIGH (ref 0.44–1.00)
GFR, Estimated: 8 mL/min — ABNORMAL LOW (ref >60)
Glucose, Bld: 136 mg/dL — ABNORMAL HIGH (ref 70–99)
Potassium: 5.4 mmol/L — ABNORMAL HIGH (ref 3.5–5.1)
Sodium: 138 mmol/L (ref 135–145)
Total Bilirubin: 4.8 mg/dL — ABNORMAL HIGH (ref 0.3–1.2)
Total Protein: 4.9 g/dL — ABNORMAL LOW (ref 6.5–8.1)

## 2021-12-16 LAB — BLOOD CULTURE ID PANEL (REFLEXED) - BCID2

## 2021-12-16 LAB — ECHOCARDIOGRAM COMPLETE
AR max vel: 2.1 cm2
AV Area VTI: 2.64 cm2
AV Area mean vel: 2.66 cm2
AV Mean grad: 5 mmHg
AV Peak grad: 13.7 mmHg
Ao pk vel: 1.85 m/s
Area-P 1/2: 3.45 cm2
Height: 64 in
S' Lateral: 3.3 cm
Weight: 4116.43 oz

## 2021-12-16 LAB — CBC
HCT: 29.9 % — ABNORMAL LOW (ref 36.0–46.0)
HCT: 27.6 % — ABNORMAL LOW (ref 36.0–46.0)
HCT: 26.4 % — ABNORMAL LOW (ref 36.0–46.0)
Hemoglobin: 10.4 g/dL — ABNORMAL LOW (ref 12.0–15.0)
Hemoglobin: 9 g/dL — ABNORMAL LOW (ref 12.0–15.0)
Hemoglobin: 8.9 g/dL — ABNORMAL LOW (ref 12.0–15.0)
MCH: 36.7 pg — ABNORMAL HIGH (ref 26.0–34.0)
MCH: 36 pg — ABNORMAL HIGH (ref 26.0–34.0)
MCH: 36.2 pg — ABNORMAL HIGH (ref 26.0–34.0)
MCHC: 34.8 g/dL (ref 30.0–36.0)
MCHC: 32.6 g/dL (ref 30.0–36.0)
MCHC: 33.7 g/dL (ref 30.0–36.0)
MCV: 105.7 fL — ABNORMAL HIGH (ref 80.0–100.0)
MCV: 110.4 fL — ABNORMAL HIGH (ref 80.0–100.0)
MCV: 107.3 fL — ABNORMAL HIGH (ref 80.0–100.0)
Platelets: 93 10*3/uL — ABNORMAL LOW (ref 150–400)
Platelets: 85 10*3/uL — ABNORMAL LOW (ref 150–400)
Platelets: 69 10*3/uL — ABNORMAL LOW (ref 150–400)
RBC: 2.83 MIL/uL — ABNORMAL LOW (ref 3.87–5.11)
RBC: 2.5 MIL/uL — ABNORMAL LOW (ref 3.87–5.11)
RBC: 2.46 MIL/uL — ABNORMAL LOW (ref 3.87–5.11)
RDW: 22.5 % — ABNORMAL HIGH (ref 11.5–15.5)
RDW: 22.5 % — ABNORMAL HIGH (ref 11.5–15.5)
RDW: 22.6 % — ABNORMAL HIGH (ref 11.5–15.5)
WBC: 5.4 10*3/uL (ref 4.0–10.5)
WBC: 5.1 10*3/uL (ref 4.0–10.5)
WBC: 3.8 10*3/uL — ABNORMAL LOW (ref 4.0–10.5)
nRBC: 3.2 % — ABNORMAL HIGH (ref 0.0–0.2)
nRBC: 3.7 % — ABNORMAL HIGH (ref 0.0–0.2)
nRBC: 5.6 % — ABNORMAL HIGH (ref 0.0–0.2)

## 2021-12-16 LAB — GLUCOSE, CAPILLARY
Glucose-Capillary: 101 mg/dL — ABNORMAL HIGH (ref 70–99)
Glucose-Capillary: 111 mg/dL — ABNORMAL HIGH (ref 70–99)
Glucose-Capillary: 113 mg/dL — ABNORMAL HIGH (ref 70–99)
Glucose-Capillary: 136 mg/dL — ABNORMAL HIGH (ref 70–99)
Glucose-Capillary: 149 mg/dL — ABNORMAL HIGH (ref 70–99)
Glucose-Capillary: 159 mg/dL — ABNORMAL HIGH (ref 70–99)

## 2021-12-16 LAB — DIC (DISSEMINATED INTRAVASCULAR COAGULATION)PANEL
D-Dimer, Quant: 3.41 ug/mL-FEU — ABNORMAL HIGH (ref 0.00–0.50)
D-Dimer, Quant: 3.27 ug/mL-FEU — ABNORMAL HIGH (ref 0.00–0.50)
D-Dimer, Quant: 3.48 ug/mL-FEU — ABNORMAL HIGH (ref 0.00–0.50)
Fibrinogen: 290 mg/dL (ref 210–475)
Fibrinogen: 324 mg/dL (ref 210–475)
Fibrinogen: 284 mg/dL (ref 210–475)
INR: 7.4 (ref 0.8–1.2)
INR: 4.6 (ref 0.8–1.2)
INR: 6.9 (ref 0.8–1.2)
Platelets: 100 10*3/uL — ABNORMAL LOW (ref 150–400)
Platelets: 86 10*3/uL — ABNORMAL LOW (ref 150–400)
Platelets: 66 10*3/uL — ABNORMAL LOW (ref 150–400)
Prothrombin Time: 62.7 seconds — ABNORMAL HIGH (ref 11.4–15.2)
Prothrombin Time: 43.1 seconds — ABNORMAL HIGH (ref 11.4–15.2)
Prothrombin Time: 59 seconds — ABNORMAL HIGH (ref 11.4–15.2)
Smear Review: NONE SEEN
aPTT: 72 seconds — ABNORMAL HIGH (ref 24–36)
aPTT: 122 seconds — ABNORMAL HIGH (ref 24–36)
aPTT: >200 seconds (ref 24–36)

## 2021-12-16 LAB — URINE CULTURE

## 2021-12-16 LAB — LIPASE, BLOOD: Lipase: 87 U/L — ABNORMAL HIGH (ref 11–51)

## 2021-12-16 LAB — RENAL FUNCTION PANEL
Albumin: 2 g/dL — ABNORMAL LOW (ref 3.5–5.0)
Anion gap: 28 — ABNORMAL HIGH (ref 5–15)
BUN: 40 mg/dL — ABNORMAL HIGH (ref 8–23)
CO2: 11 mmol/L — ABNORMAL LOW (ref 22–32)
Calcium: 6.8 mg/dL — ABNORMAL LOW (ref 8.9–10.3)
Chloride: 102 mmol/L (ref 98–111)
Creatinine, Ser: 4.89 mg/dL — ABNORMAL HIGH (ref 0.44–1.00)
GFR, Estimated: 9 mL/min — ABNORMAL LOW (ref >60)
Glucose, Bld: 142 mg/dL — ABNORMAL HIGH (ref 70–99)
Phosphorus: 8.9 mg/dL — ABNORMAL HIGH (ref 2.5–4.6)
Potassium: 6.4 mmol/L (ref 3.5–5.1)
Sodium: 141 mmol/L (ref 135–145)

## 2021-12-16 LAB — PROTIME-INR
INR: 7.1 (ref 0.8–1.2)
Prothrombin Time: 60.3 seconds — ABNORMAL HIGH (ref 11.4–15.2)

## 2021-12-16 LAB — MAGNESIUM
Magnesium: 2 mg/dL (ref 1.7–2.4)
Magnesium: 2.2 mg/dL (ref 1.7–2.4)

## 2021-12-16 LAB — LACTIC ACID, PLASMA
Lactic Acid, Venous: 8.1 mmol/L (ref 0.5–1.9)
Lactic Acid, Venous: 8.9 mmol/L (ref 0.5–1.9)
Lactic Acid, Venous: >9 mmol/L (ref 0.5–1.9)
Lactic Acid, Venous: >9 mmol/L (ref 0.5–1.9)
Lactic Acid, Venous: >9 mmol/L (ref 0.5–1.9)

## 2021-12-16 MED ORDER — IPRATROPIUM-ALBUTEROL 0.5-2.5 (3) MG/3ML IN SOLN
3.0000 mL | Freq: Four times a day (QID) | RESPIRATORY_TRACT | Status: DC | PRN
  Administered 2021-12-16: 3 mL via RESPIRATORY_TRACT
  Filled 2021-12-16: qty 3

## 2021-12-16 MED ORDER — VITAMIN K1 10 MG/ML IJ SOLN
10.0000 mg | Freq: Once | INTRAVENOUS | Status: AC
  Administered 2021-12-16: 10 mg via INTRAVENOUS
  Filled 2021-12-16: qty 1

## 2021-12-16 MED ORDER — SODIUM CHLORIDE 0.9 % IV SOLN
2.0000 g | Freq: Two times a day (BID) | INTRAVENOUS | Status: DC
Start: 2021-12-17 — End: 2021-12-17
  Administered 2021-12-16 – 2021-12-17 (×2): 2 g via INTRAVENOUS
  Filled 2021-12-16 (×3): qty 12.5

## 2021-12-16 MED ORDER — INSULIN ASPART 100 UNIT/ML IV SOLN
5.0000 [IU] | Freq: Once | INTRAVENOUS | Status: AC
  Administered 2021-12-16: 5 [IU] via INTRAVENOUS

## 2021-12-16 MED ORDER — SODIUM BICARBONATE 8.4 % IV SOLN
INTRAVENOUS | Status: AC
  Filled 2021-12-16: qty 200

## 2021-12-16 MED ORDER — VANCOMYCIN VARIABLE DOSE PER UNSTABLE RENAL FUNCTION (PHARMACIST DOSING): Status: DC

## 2021-12-16 MED ORDER — MIDAZOLAM HCL 2 MG/2ML IJ SOLN: 2.0000 mg | INTRAMUSCULAR | Status: DC | PRN

## 2021-12-16 MED ORDER — PRISMASOL BGK 4/2.5 32-4-2.5 MEQ/L EC SOLN
Status: DC
Start: 2021-12-16 — End: 2021-12-17

## 2021-12-16 MED ORDER — ROCURONIUM BROMIDE 10 MG/ML (PF) SYRINGE
PREFILLED_SYRINGE | INTRAVENOUS | Status: AC
  Administered 2021-12-16: 100 mg via INTRAVENOUS
  Filled 2021-12-16: qty 10

## 2021-12-16 MED ORDER — NOREPINEPHRINE 16 MG/250ML-% IV SOLN
0.0000 ug/min | INTRAVENOUS | Status: DC
  Administered 2021-12-16 (×2): 40 ug/min via INTRAVENOUS
  Administered 2021-12-16: 5 ug/min via INTRAVENOUS
  Administered 2021-12-17: 80 ug/min via INTRAVENOUS
  Administered 2021-12-17: 55 ug/min via INTRAVENOUS
  Administered 2021-12-17: 80 ug/min via INTRAVENOUS
  Filled 2021-12-16 (×7): qty 250

## 2021-12-16 MED ORDER — PHENYLEPHRINE HCL-NACL 20-0.9 MG/250ML-% IV SOLN
0.0000 ug/min | INTRAVENOUS | Status: DC
  Filled 2021-12-16: qty 250

## 2021-12-16 MED ORDER — ORAL CARE MOUTH RINSE: 15.0000 mL | OROMUCOSAL | Status: DC | PRN

## 2021-12-16 MED ORDER — SODIUM BICARBONATE 8.4 % IV SOLN
INTRAVENOUS | Status: AC
  Filled 2021-12-16: qty 50

## 2021-12-16 MED ORDER — SODIUM BICARBONATE 8.4 % IV SOLN
INTRAVENOUS | Status: DC
  Filled 2021-12-16 (×4): qty 1000
  Filled 2021-12-16: qty 150

## 2021-12-16 MED ORDER — STERILE WATER FOR INJECTION IV SOLN
Freq: Once | INTRAVENOUS | Status: AC
  Filled 2021-12-16: qty 1000

## 2021-12-16 MED ORDER — DEXTROSE 50 % IV SOLN
1.0000 | Freq: Once | INTRAVENOUS | Status: AC
  Administered 2021-12-16: 50 mL via INTRAVENOUS
  Filled 2021-12-16: qty 50

## 2021-12-16 MED ORDER — SODIUM CHLORIDE 0.9% IV SOLUTION: Freq: Once | INTRAVENOUS | Status: DC

## 2021-12-16 MED ORDER — HEPARIN (PORCINE) 2000 UNITS/L FOR CRRT: INTRAVENOUS_CENTRAL | Status: DC | PRN

## 2021-12-16 MED ORDER — POLYETHYLENE GLYCOL 3350 17 G PO PACK
17.0000 g | PACK | Freq: Every day | ORAL | Status: DC
  Filled 2021-12-16: qty 1

## 2021-12-16 MED ORDER — CALCIUM GLUCONATE-NACL 2-0.675 GM/100ML-% IV SOLN
2.0000 g | Freq: Once | INTRAVENOUS | Status: AC
  Administered 2021-12-17: 2000 mg via INTRAVENOUS
  Filled 2021-12-16: qty 100

## 2021-12-16 MED ORDER — VASOPRESSIN 20 UNITS/100 ML INFUSION FOR SHOCK: 0.0000 [IU]/min | INTRAVENOUS | Status: DC

## 2021-12-16 MED ORDER — SODIUM BICARBONATE 8.4 % IV SOLN: 50.0000 meq | Freq: Once | INTRAVENOUS | Status: AC

## 2021-12-16 MED ORDER — VANCOMYCIN HCL 10 G IV SOLR
2500.0000 mg | Freq: Once | INTRAVENOUS | Status: AC
  Administered 2021-12-16: 2500 mg via INTRAVENOUS
  Filled 2021-12-16: qty 2500

## 2021-12-16 MED ORDER — HEPARIN SODIUM (PORCINE) 1000 UNIT/ML DIALYSIS: 1000.0000 [IU] | INTRAMUSCULAR | Status: DC | PRN

## 2021-12-16 MED ORDER — SODIUM CHLORIDE 0.9 % IV SOLN
500.0000 [IU]/h | INTRAVENOUS | Status: DC
  Administered 2021-12-16 – 2021-12-17 (×2): 500 [IU]/h via INTRAVENOUS_CENTRAL
  Filled 2021-12-16 (×3): qty 2

## 2021-12-16 MED ORDER — FENTANYL BOLUS VIA INFUSION
50.0000 ug | INTRAVENOUS | Status: DC | PRN
  Administered 2021-12-17: 50 ug via INTRAVENOUS

## 2021-12-16 MED ORDER — FENTANYL CITRATE PF 50 MCG/ML IJ SOSY
PREFILLED_SYRINGE | INTRAMUSCULAR | Status: AC
  Administered 2021-12-16: 100 ug via INTRAVENOUS
  Filled 2021-12-16: qty 2

## 2021-12-16 MED ORDER — SODIUM BICARBONATE 8.4 % IV SOLN
INTRAVENOUS | Status: DC
  Filled 2021-12-16 (×12): qty 150

## 2021-12-16 MED ORDER — EPINEPHRINE 1 MG/10ML IJ SOSY
PREFILLED_SYRINGE | INTRAMUSCULAR | Status: AC
  Administered 2021-12-16: 0.5 mg
  Filled 2021-12-16: qty 10

## 2021-12-16 MED ORDER — LACTATED RINGERS IV BOLUS
1000.0000 mL | Freq: Once | INTRAVENOUS | Status: AC
Start: 2021-12-16 — End: 2021-12-16
  Administered 2021-12-16: 1000 mL via INTRAVENOUS

## 2021-12-16 MED ORDER — ROCURONIUM BROMIDE 50 MG/5ML IV SOLN
100.0000 mg | Freq: Once | INTRAVENOUS | Status: AC
  Filled 2021-12-16: qty 10

## 2021-12-16 MED ORDER — VASOPRESSIN 20 UNITS/100 ML INFUSION FOR SHOCK
0.0000 [IU]/min | INTRAVENOUS | Status: DC
  Administered 2021-12-16 – 2021-12-17 (×2): 0.04 [IU]/min via INTRAVENOUS
  Filled 2021-12-16 (×3): qty 100

## 2021-12-16 MED ORDER — SODIUM BICARBONATE 8.4 % IV SOLN
50.0000 meq | Freq: Once | INTRAVENOUS | Status: AC
  Administered 2021-12-16: 50 meq via INTRAVENOUS
  Filled 2021-12-16: qty 50

## 2021-12-16 MED ORDER — SODIUM BICARBONATE 8.4 % IV SOLN
INTRAVENOUS | Status: AC
  Administered 2021-12-16: 50 meq via INTRAVENOUS
  Filled 2021-12-16: qty 50

## 2021-12-16 MED ORDER — FENTANYL 2500MCG IN NS 250ML (10MCG/ML) PREMIX INFUSION
50.0000 ug/h | INTRAVENOUS | Status: DC
  Administered 2021-12-16: 50 ug/h via INTRAVENOUS
  Filled 2021-12-16: qty 250

## 2021-12-16 MED ORDER — SODIUM BICARBONATE 8.4 % IV SOLN
50.0000 meq | Freq: Once | INTRAVENOUS | Status: AC
  Administered 2021-12-16: 50 meq via INTRAVENOUS

## 2021-12-16 MED ORDER — SODIUM CHLORIDE 0.9 % IV BOLUS
1000.0000 mL | Freq: Once | INTRAVENOUS | Status: AC
  Administered 2021-12-16: 1000 mL via INTRAVENOUS

## 2021-12-16 MED ORDER — HYDROMORPHONE HCL 1 MG/ML IJ SOLN
0.5000 mg | INTRAMUSCULAR | Status: DC | PRN
  Administered 2021-12-16: 0.5 mg via INTRAVENOUS
  Filled 2021-12-16: qty 0.5

## 2021-12-16 MED ORDER — SODIUM CHLORIDE 0.9% FLUSH
10.0000 mL | Freq: Two times a day (BID) | INTRAVENOUS | Status: DC
  Administered 2021-12-17: 10 mL

## 2021-12-16 MED ORDER — STERILE WATER FOR INJECTION IV SOLN
INTRAMUSCULAR | Status: DC
  Filled 2021-12-16 (×11): qty 150

## 2021-12-16 MED ORDER — METHYLPREDNISOLONE SODIUM SUCC 125 MG IJ SOLR
125.0000 mg | Freq: Two times a day (BID) | INTRAMUSCULAR | Status: DC
  Administered 2021-12-16 – 2021-12-17 (×3): 125 mg via INTRAVENOUS
  Filled 2021-12-16 (×3): qty 2

## 2021-12-16 MED ORDER — CALCIUM GLUCONATE-NACL 2-0.675 GM/100ML-% IV SOLN
2.0000 g | Freq: Once | INTRAVENOUS | Status: AC
  Administered 2021-12-16: 2000 mg via INTRAVENOUS
  Filled 2021-12-16: qty 100

## 2021-12-16 MED ORDER — ALBUMIN HUMAN 5 % IV SOLN
INTRAVENOUS | Status: AC
  Filled 2021-12-16: qty 500

## 2021-12-16 MED ORDER — SODIUM CHLORIDE 0.9% IV SOLUTION: Freq: Once | INTRAVENOUS | Status: AC

## 2021-12-16 MED ORDER — ORAL CARE MOUTH RINSE
15.0000 mL | OROMUCOSAL | Status: DC
  Administered 2021-12-16 – 2021-12-17 (×4): 15 mL via OROMUCOSAL

## 2021-12-16 MED ORDER — MIDAZOLAM HCL 2 MG/2ML IJ SOLN: 4.0000 mg | Freq: Once | INTRAMUSCULAR | Status: AC

## 2021-12-16 MED ORDER — SODIUM CHLORIDE 0.9% FLUSH: 10.0000 mL | INTRAVENOUS | Status: DC | PRN

## 2021-12-16 MED ORDER — FENTANYL CITRATE PF 50 MCG/ML IJ SOSY: 100.0000 ug | PREFILLED_SYRINGE | Freq: Once | INTRAMUSCULAR | Status: AC

## 2021-12-16 MED ORDER — PHENYLEPHRINE CONCENTRATED 100MG/250ML (0.4 MG/ML) INFUSION SIMPLE
0.0000 ug/min | INTRAVENOUS | Status: DC
  Administered 2021-12-16: 400 ug/min via INTRAVENOUS
  Administered 2021-12-16: 20 ug/min via INTRAVENOUS
  Administered 2021-12-17 (×2): 400 ug/min via INTRAVENOUS
  Filled 2021-12-16 (×6): qty 250

## 2021-12-16 MED ORDER — EPINEPHRINE HCL 5 MG/250ML IV SOLN IN NS
0.5000 ug/min | INTRAVENOUS | Status: DC
  Administered 2021-12-16: 0.5 ug/min via INTRAVENOUS
  Administered 2021-12-16 – 2021-12-17 (×2): 20 ug/min via INTRAVENOUS
  Administered 2021-12-17 (×3): 40 ug/min via INTRAVENOUS
  Filled 2021-12-16 (×7): qty 250

## 2021-12-16 MED ORDER — VANCOMYCIN HCL 1250 MG/250ML IV SOLN
1250.0000 mg | INTRAVENOUS | Status: DC
  Filled 2021-12-16: qty 250

## 2021-12-16 MED ORDER — FENTANYL CITRATE PF 50 MCG/ML IJ SOSY: 50.0000 ug | PREFILLED_SYRINGE | Freq: Once | INTRAMUSCULAR | Status: DC

## 2021-12-16 MED ORDER — DOCUSATE SODIUM 50 MG/5ML PO LIQD
100.0000 mg | Freq: Two times a day (BID) | ORAL | Status: DC
  Filled 2021-12-16: qty 10

## 2021-12-16 MED ORDER — SODIUM CHLORIDE 0.9 % IV SOLN: 2.0000 g | INTRAVENOUS | Status: DC

## 2021-12-16 MED ORDER — MIDAZOLAM HCL 2 MG/2ML IJ SOLN
INTRAMUSCULAR | Status: AC
  Administered 2021-12-16: 2 mg via INTRAVENOUS
  Filled 2021-12-16: qty 2

## 2021-12-16 MED ORDER — VASOPRESSIN 20 UNITS/100 ML INFUSION FOR SHOCK
INTRAVENOUS | Status: AC
  Administered 2021-12-16: 0.03 [IU]/min via INTRAVENOUS
  Filled 2021-12-16: qty 100

## 2021-12-16 MED ORDER — CALCIUM GLUCONATE-NACL 1-0.675 GM/50ML-% IV SOLN
1.0000 g | Freq: Once | INTRAVENOUS | Status: AC
Start: 2021-12-16 — End: 2021-12-16
  Administered 2021-12-16: 1000 mg via INTRAVENOUS
  Filled 2021-12-16: qty 50

## 2021-12-16 NOTE — Progress Notes (Signed)
Attempted video chat with email address provided. Unable to hear or see visitor however visitors in the room contacted video attendee and confirmed he could hear [place] and see [place]. Offered to send text link but no number was provided.

## 2021-12-16 NOTE — Progress Notes (Signed)
eLink Physician-Brief Progress Note [name] DOB: March 09, 1956 MRN: [id number]   Date of Service  12/16/2021  HPI/Events of Note  Pt remains hypotensive with levophed at 40mcg/min and vasopressin.  RN reporting low albumin at 1.8.   Pharmacy also reported 1 blood culture with staph species which could be a contaminant.   eICU Interventions  Give 1L LR bolus.  Continue levophed and vasopressin.  Add phenylephrine gtt.  Continue empiric antibiotics.      Intervention Category Major Interventions: Shock - evaluation and management  Larinda Buttery 12/16/2021, 5:26 AM

## 2021-12-16 NOTE — Progress Notes (Signed)
Subjective: CC: Notes reviewed from overnight Brother at bedside She complains of R sided abdominal pain and sob. NGT was placed overnight. She is not passing flatus and no bm since admission. Minimal urine output.  Objective: Vital signs in last 24 hours: Temp:  [95.5 F (35.3 C)-98 F (36.7 C)] 97.8 F (36.6 C) (07/04 0410) Pulse Rate:  [71-88] 84 (07/04 0615) Resp:  [0-25] 21 (07/04 0615) BP: (67-141)/(14-99) 79/57 (07/04 0230) SpO2:  [80 %-100 %] 99 % (07/04 0847) Arterial Line BP: (72-133)/(31-48) 88/45 (07/04 0615) Weight:  [116.7 kg] 116.7 kg (07/04 0500) Last BM Date :  (PTA)  Intake/Output from previous day: 07/03 0701 - 07/04 0700 In: 6835.8 [I.V.:1565.1; Blood:590; IV Piggyback:4680.6] Out: 210 [Urine:10; Emesis/NG output:200] Intake/Output this shift: No intake/output data recorded.  PE: Gen: Ill appearing female sitting up in bed Card:  Reg Pulm: Distant breath sounds at bases b/l, on o2. Pacemaker site without overlying skin erythema  Abd: Some R sided and epigastric tenderness but very soft without rigidity or guarding. Mild distension, hypoactive bs.   Lab Results:  Recent Labs    01/01/2022 1008 12/19/2021 1024 12/16/21 0343 12/16/21 0744 12/16/21 0905  WBC 4.8  --  5.4  --   --   HGB 10.5*   < > 10.4* 10.9*  --   HCT 31.6*   < > 29.9* 32.0*  --   PLT 95*  --  93*  --  100*   < > = values in this interval not displayed.   BMET Recent Labs    01/04/2022 2346 12/16/21 0008 12/16/21 0343 12/16/21 0744  NA 136   < > 138 137  K 5.7*   < > 5.4* 6.5*  CL 113*  --  109  --   CO2 10*  --  10*  --   GLUCOSE 71  --  136*  --   BUN 43*  --  45*  --   CREATININE 5.20*  --  5.48*  --   CALCIUM 6.5*  --  6.8*  --    < > = values in this interval not displayed.   PT/INR Recent Labs    12/16/21 0512 12/16/21 0905  LABPROT 60.3* PENDING  INR 7.1* PENDING   CMP     Component Value Date/Time   NA 137 12/16/2021 0744   NA 141 07/27/2018  1653   K 6.5 (HH) 12/16/2021 0744   CL 109 12/16/2021 0343   CO2 10 (L) 12/16/2021 0343   GLUCOSE 136 (H) 12/16/2021 0343   BUN 45 (H) 12/16/2021 0343   BUN 18 07/27/2018 1653   CREATININE 5.48 (H) 12/16/2021 0343   CALCIUM 6.8 (L) 12/16/2021 0343   PROT 4.9 (L) 12/16/2021 0343   PROT 7.0 07/27/2018 1653   ALBUMIN 1.8 (L) 12/16/2021 0343   ALBUMIN 3.4 (L) 07/27/2018 1653   AST 1,015 (H) 12/16/2021 0343   ALT 398 (H) 12/16/2021 0343   ALKPHOS 226 (H) 12/16/2021 0343   BILITOT 4.8 (H) 12/16/2021 0343   BILITOT 0.3 07/27/2018 1653   GFRNONAA 8 (L) 12/16/2021 0343   GFRAA >60 01/27/2019 0305   Lipase     Component Value Date/Time   LIPASE 87 (H) 12/16/2021 0343    Studies/Results: DG CHEST PORT 1 VIEW  Result Date: 12/16/2021 CLINICAL DATA:  Check central line placement EXAM: PORTABLE CHEST 1 VIEW COMPARISON:  Film from the previous day. FINDINGS: Cardiac shadow is enlarged but stable. Aortic calcifications are  seen. Pacing device is again noted. Gastric catheter has been advanced into the stomach although a large hiatal hernia is noted. Right jugular central line is noted extending into the superior right atrium. No pneumothorax is seen. Mild vascular congestion is noted. IMPRESSION: No pneumothorax following central line placement. Large hiatal hernia with gastric catheter within. Mild vascular congestion. Electronically Signed   By: Alcide Clever M.D.   On: 12/16/2021 03:53   DG Abd 1 View  Result Date: 12/16/2021 CLINICAL DATA:  NG tube placement. EXAM: ABDOMEN - 1 VIEW COMPARISON:  Dec 19, 2021. FINDINGS: Examination is limited due to single view obtained. The bowel gas pattern is normal. An enteric tube terminates at the right lung base in the region of a known large hiatal hernia. No renal calculi. Surgical clips are noted in the right upper quadrant. A pacemaker device is present over the left chest. Mild strandy atelectasis is noted at the lung bases. IMPRESSION: Enteric tube  terminates at the right lung base in the region of known large hiatal hernia. Electronically Signed   By: Thornell Sartorius M.D.   On: 12/16/2021 01:52   CT Angio Abd/Pel w/ and/or w/o  Result Date: 19-Dec-2021 CLINICAL DATA:  Mesenteric ischemia, acute. Evaluate ischemic colitis. EXAM: CTA ABDOMEN AND PELVIS WITHOUT AND WITH CONTRAST TECHNIQUE: Multidetector CT imaging of the abdomen and pelvis was performed using the standard protocol during bolus administration of intravenous contrast. Multiplanar reconstructed images and MIPs were obtained and reviewed to evaluate the vascular anatomy. RADIATION DOSE REDUCTION: This exam was performed according to the departmental dose-optimization program which includes automated exposure control, adjustment of the mA and/or kV according to patient size and/or use of iterative reconstruction technique. CONTRAST:  OMNIPAQUE IOHEXOL 350 MG/ML SOLN COMPARISON:  Noncontrast CT earlier today. FINDINGS: VASCULAR Aorta: Normal caliber aorta without aneurysm, dissection, vasculitis or significant stenosis. Mild to moderate atherosclerosis. Celiac: Patent without evidence of aneurysm, dissection, vasculitis or significant stenosis. The splenic artery may arise directly from the upper abdominal aorta and is diminutive. SMA: Patent without evidence of aneurysm, dissection, vasculitis or significant stenosis. Renals: Small in caliber. No severe stenosis. Early branching of the right renal artery. IMA: Poorly opacified, unclear if this is due to small size. There is no evidence of embolic disease. Inflow: Patent without evidence of aneurysm, dissection, vasculitis or significant stenosis. Proximal Outflow: Bilateral common femoral and visualized portions of the superficial and profunda femoral arteries are patent without evidence of aneurysm, dissection, vasculitis or significant stenosis. Vessels are small in caliber. Veins: No acute findings on venous phase imaging. No portal venous  gas. Review of the MIP images confirms the above findings. NON-VASCULAR Lower chest: Again seen large hiatal hernia. The entire chest was evaluated on CT earlier today. No significant change from that exam. Hepatobiliary: No focal hepatic abnormality cholecystectomy without biliary dilatation. Pancreas: Faint peripancreatic edema is again seen. No ductal dilatation or evidence of focal pancreatic abnormality Spleen: Normal in size without focal abnormality. Adrenals/Urinary Tract: No adrenal nodule. No hydronephrosis. No focal renal abnormality. Urinary bladder is empty. Stomach/Bowel: The majority of the stomach is intrathoracic. Dependent density in the stomach is felt to be related to ingested material rather than active GI bleed, and is unchanged on arterial and venous phase. The entire stomach is not included in the field of view, but was seen on chest CT earlier today. Ingested material within the descending colon does not represent active GI hemorrhage in was seen on noncontrast exam earlier today, series 5, image  71. There is possible wall thickening of the cecum and proximal ascending colon, series 5 images 56 and 66, similar pericolonic edema to earlier today in this region. There is no definite colonic inflammation at the splenic flexure in a location typical for ischemic colitis. Scattered colonic diverticula, without focal diverticulitis. Normal appendix. No small bowel inflammation. No bowel pneumatosis. Lymphatic: No abdominopelvic adenopathy. Reproductive: Small calcified uterine fibroids. Quiescent ovaries, no adnexal mass. Other: Mild fat stranding about the mesentery and retroperitoneum. No ascites or free air. No abdominal wall hernia. Musculoskeletal: Stable lumbar degenerative change. No acute findings. IMPRESSION: 1. Aortic atherosclerosis. Small caliber aortic branches including the mesenteric arteries. The IMA is poorly opacified, likely due to small caliber rather than occlusion. No  evidence of embolic disease. There are no findings in the IMA vascular distribution of the colon to suggest ischemic colitis. 2. Questionable wall thickening of the cecum and proximal ascending colon in the area of previous pericolonic edema. Favor mild colitis. Location is typical of infectious or inflammatory process. There is no definite colonic inflammation at the splenic flexure in a location typical for ischemic colitis. 3. Colonic diverticulosis. 4. Large hiatal hernia with the entire stomach being intrathoracic. The entire stomach is not included in the field of view, but was included on chest CT earlier today. 5. Faint peripancreatic edema is again seen, nonspecific but can be seen in the setting of acute pancreatitis. Recommend correlation with pancreatic enzymes. Aortic Atherosclerosis (ICD10-I70.0). Electronically Signed   By: Narda RutherfordMelanie  Sanford M.D.   On: 12/13/2021 20:30   US Abdomen Limited RUQ (LIVER/GB)  Result Date: 12/29/2021 CLINICAL DATA:  Elevated total bilirubin levels. EXAM: ULTRASOUND ABDOMEN LIMITED RIGHT UPPER QUADRANT COMPARISON:  CT from earlier today FINDINGS: Gallbladder: Status post cholecystectomy. Common bile duct: Diameter: 3.4 mm.  No signs of intrahepatic bile duct dilatation. Liver: No focal lesion identified. Within normal limits in parenchymal echogenicity. Portal vein is patent on color Doppler imaging with normal direction of blood flow towards the liver. Other: Exam detail diminished secondary to body habitus. IMPRESSION: 1. No acute findings.  No bile duct dilatation identified. 2. Status post cholecystectomy. Electronically Signed   By: Signa Kellaylor  Stroud M.D.   On: 12/30/2021 14:45   CT CHEST ABDOMEN PELVIS WO CONTRAST  Result Date: 12/22/2021 CLINICAL DATA:  Abnormal chest radiograph. Patient's symptoms including abdominal pain and possible sepsis. EXAM: CT CHEST, ABDOMEN AND PELVIS WITHOUT CONTRAST TECHNIQUE: Multidetector CT imaging of the chest, abdomen and pelvis  was performed following the standard protocol without IV contrast. RADIATION DOSE REDUCTION: This exam was performed according to the departmental dose-optimization program which includes automated exposure control, adjustment of the mA and/or kV according to patient size and/or use of iterative reconstruction technique. COMPARISON:  Chest radiograph of 12/27/2021. Chest abdomen and pelvic CTs of 01/15/2019. FINDINGS: CT CHEST FINDINGS Cardiovascular: Right atrial and ventricular pacer. Aortic atherosclerosis. Mild cardiomegaly, without pericardial effusion. Lad coronary artery calcification. Mediastinum/Nodes: No mediastinal or definite hilar adenopathy, given limitations of unenhanced CT. Large hiatal hernia, with nearly the entire stomach positioned in the low right chest. Similar in size to on the prior. No complicating obstruction. Increased density within the herniated fat is mild, including on 40/3. Lungs/Pleura: Trace right pleural fluid is similar. Right lower lobe dependent atelectasis and volume loss, slightly increased since 2020. No lobar consolidation. Musculoskeletal: No acute osseous abnormality. CT ABDOMEN PELVIS FINDINGS Hepatobiliary: Normal noncontrast appearance of the liver. Cholecystectomy, without biliary ductal dilatation. Pancreas: No pancreatic duct dilatation. Subtle edema within  the peripancreatic fat including on 54/3. Spleen: Normal in size, without focal abnormality. Adrenals/Urinary Tract: Normal adrenal glands. No renal calculi or hydronephrosis. No hydroureter or ureteric calculi. No bladder calculi. Stomach/Bowel: Primarily herniated into the lower chest, as detailed above. Scattered colonic diverticula. Suspect somewhat more focal edema adjacent the ascending colon including on 72/3. Normal terminal ileum and appendix.  Normal small bowel. Vascular/Lymphatic: Aortic atherosclerosis. No abdominopelvic adenopathy. Reproductive: Calcified uterine fibroids. Other: No significant free  fluid.  No free intraperitoneal air. Musculoskeletal: Osteopenia.  Degenerate disc disease at L4-5. IMPRESSION: 1. Decreased sensitivity exam secondary to lack of oral or IV contrast. 2. Relatively diffuse intraabdominal edema, decreasing sensitivity for focal areas of inflammation. 3. Large hiatal hernia, with the majority of the stomach positioned in the lower chest. Subtle edema within the herniated fat is nonspecific, especially given above diffuse edema. Correlate with symptoms of inflammation or even ischemia of the herniated stomach. 4. Similarly, mild peripancreatic edema for which pancreatitis cannot be excluded. 5. More focal edema about the ascending colon could represent colitis or (given scattered diverticula) diverticulitis. 6. Small volume uterine fibroids. 7. Coronary artery atherosclerosis. Aortic Atherosclerosis (ICD10-I70.0). Electronically Signed   By: Jeronimo Greaves M.D.   On: 01/09/22 12:04   DG Chest Port 1 View  Result Date: 01-09-22 CLINICAL DATA:  Abdominal pain, possible sepsis EXAM: PORTABLE CHEST 1 VIEW COMPARISON:  10/14/2021 FINDINGS: Dual lead pacer noted. Increased appreciable prominence of abnormal right retrocardiac and pericardiac density, most likely attributable to the large hiatal hernia shown on 01/15/2019. Atherosclerotic calcification of the aortic arch. Underlying mild to moderate enlargement of the cardiopericardial silhouette. The lungs appear otherwise clear. IMPRESSION: 1. Increased prominence of right retrocardiac density. While most likely attributable to the patient's large right eccentric hiatal hernia, I cannot exclude superimposed pneumonia or underlying lesion medially in the right lung base. This could be further characterized with chest CT if clinically warranted. 2.  Aortic Atherosclerosis (ICD10-I70.0). 3. At least mild enlargement of the cardiopericardial silhouette, without pulmonary edema. 4. Dual lead pacer. Electronically Signed   By: Gaylyn Rong M.D.   On: 01-09-2022 09:34    Anti-infectives: Anti-infectives (From admission, onward)    Start     Dose/Rate Route Frequency Ordered Stop   12/22/2021 1100  levofloxacin (LEVAQUIN) IVPB 500 mg  Status:  Discontinued        500 mg 100 mL/hr over 60 Minutes Intravenous Every 48 hours 01/09/22 1041 09-Jan-2022 1405   12/16/21 1000  cefTRIAXone (ROCEPHIN) 2 g in sodium chloride 0.9 % 100 mL IVPB        2 g 200 mL/hr over 30 Minutes Intravenous Every 24 hours 2022/01/09 1434     12/16/21 0915  vancomycin (VANCOCIN) 2,500 mg in sodium chloride 0.9 % 500 mL IVPB        2,500 mg 262.5 mL/hr over 120 Minutes Intravenous  Once 12/16/21 0827     12/16/21 0828  vancomycin variable dose per unstable renal function (pharmacist dosing)         Does not apply See admin instructions 12/16/21 0828     01/09/22 2200  metroNIDAZOLE (FLAGYL) IVPB 500 mg        500 mg 100 mL/hr over 60 Minutes Intravenous Every 12 hours 01-09-22 1434     01/09/22 0930  levofloxacin (LEVAQUIN) IVPB 750 mg        750 mg 100 mL/hr over 90 Minutes Intravenous  Once 01/09/2022 0915 Jan 09, 2022 1243   01-09-2022 0930  metroNIDAZOLE (  FLAGYL) IVPB 500 mg        500 mg 100 mL/hr over 60 Minutes Intravenous  Once 12/28/2021 0915 12/26/2021 1140        Assessment/Plan Septic Shock with multiorgan dysfunction (Acute Renal Failure, Shock Liver) requiring pressors without clear source in patient who is immunocompromise - Patient with increased pressor requirement on levo, vasopressin, phenylephrine with acute renal failure (Cr 5.48, Oliguric) with electrolyte derangements (K 6.5), hepatic dysfunction (elevated LFT's, INR 7.1), metabolic acidosis on ABG and worsening lactic acid at 8.9.  Her CT A/P showed a large hiatal hernia without obstruction (this was previously noted on scans in 2020) with some subtle edema within the herniated fat that is nonspecific; some mild peripancreatic edema (lipase 87, hx Lap Chole), and some focal edema of  the ascending colon.  CTA showed no evidence of intra-abdominal embolic disease or ischemic colitis; there is question of some wall thickening of the cecum and proximal ascending colon that was favored to be mild colitis from infectious/inflammatory process rather than ischemic colitis given its location. She has been started on stress dose steroids by CCM and abx broadened to Rocephin, Flagyl, Vancomycin. Agree with continuing broad-spectrum antibiotics. Agree with CRRT in the setting of acute renal failure and metabolic acidosis. It is unclear what is driving the patient's septic picture. She does have positive blood cultures (staph), unclear if contaminant. I will review the images and case with my attending to see if there is any indication for exploratory surgery given patient's continued decline despite maximal medical therapy. I do fear if we take her to the OR she would be at high risk for complications such as ongoing bleeding in the setting of elevated INR (CCM checking DIC panel). In the meantime continue to look for other source of infection (primary getting Echo). Further recs to follow. Discussed this with patient and family at bedside.   FEN - NPO, NGT (review films to see if in good position), IVF per primary VTE - SCDs, supratherapeutic INR ID - Rocephin/Flagyl/Vanc Foley - None, oliguric  Recent Pacemaker placement Staph Bacteremia  Shock Liver with Elevated LFTs and elevated INR SLE, on chronic on Imuran Hyperkalemia AKI/ARF -nephrology consulting History of COPD History of diabetes History of hypertension   LOS: 1 day    Jacinto Halim , [facility] 12/16/2021, 10:47 AM Please see Amion for pager number during day hours 7:00am-4:30pm

## 2021-12-16 NOTE — Progress Notes (Signed)
eLink Physician-Brief Progress Note [name] DOB: [birth year] MRN: [id number]   Date of Service  12/16/2021  HPI/Events of Note  Notified of refractory shock.  BP marginal at 82/47 while max on epi, norepi, neosynephrine and vasopressin.   Pt is on CRRT running even.  ABG draw - 7.35/30.9/332.   Pt also started on stress dose steroids today. Pt on Vancomycin, cefepime and flagyl.   eICU Interventions  Increase max dose of norepinephrine to 54mcg/min to maintain MAP of 65. Continue all other pressors.     Intervention Category Major Interventions: Shock - evaluation and management  Larinda Buttery 12/16/2021, 8:13 PM

## 2021-12-16 NOTE — Progress Notes (Signed)
RT pulled ETT back by 2cm per order. Pt now at 22 @ the lip.

## 2021-12-16 NOTE — Procedures (Signed)
Central Venous Catheter Insertion Procedure Note  Stacey Burns  [id number]  25-Nov-1955  Date:12/16/21  Time:3:43 AM   Provider Performing:[name] [name] [name]   Procedure: Insertion of Non-tunneled Central Venous [phone]) with US guidance (25956)   Indication(s) Medication administration  Consent Risks of the procedure as well as the alternatives and risks of each were explained to the patient and/or caregiver.  Consent for the procedure was obtained and is signed in the bedside chart  Anesthesia Topical only with 1% lidocaine   Timeout Verified patient identification, verified procedure, site/side was marked, verified correct patient position, special equipment/implants available, medications/allergies/relevant history reviewed, required imaging and test results available.  Sterile Technique Maximal sterile technique including full sterile barrier drape, hand hygiene, sterile gown, sterile gloves, mask, hair covering, sterile ultrasound probe cover (if used).  Procedure Description Area of catheter insertion was cleaned with chlorhexidine and draped in sterile fashion.  With real-time ultrasound guidance a central venous catheter was placed into the right internal jugular vein. Nonpulsatile blood flow and easy flushing noted in all ports.  The catheter was sutured in place and sterile dressing applied.  Complications/Tolerance None; patient tolerated the procedure well. Chest X-ray is ordered to verify placement for internal jugular or subclavian cannulation.   Chest x-ray is not ordered for femoral cannulation.  EBL Minimal  Specimen(s) None     [name], AGACNP-BC [name]  See Amion for personal pager PCCM on call pager [phone] until 7pm. Please call Elink 7p-7a. [phone]  12/16/2021 3:44 AM

## 2021-12-16 NOTE — Progress Notes (Signed)
eLink Physician-Brief Progress Note [name] DOB: 11-14-1955 MRN: [id number]   Date of Service  12/16/2021  HPI/Events of Note  CXR reviewed and the ETT tip is about 0.5cm above the carina.  Labs reviewed.   eICU Interventions  Agree in pulling back ETT by 2cm.  Repeat CXR.  Replete calcium.      Intervention Category Intermediate Interventions: Diagnostic test evaluation  Larinda Buttery 12/16/2021, 11:39 PM

## 2021-12-16 NOTE — Progress Notes (Signed)
eLink Physician-Brief Progress Note [name] DOB: [birth year] MRN: [id number]   Date of Service  12/16/2021  HPI/Events of Note  Notified that patient has increased work of breathing and with use of accessory muscle use. She is more lethargic as well.   eICU Interventions  Get ABG stat.  Give duoneb breathing treatment.      Intervention Category Intermediate Interventions: Respiratory distress - evaluation and management  Larinda Buttery 12/16/2021, 12:01 AM  12:26 AM ABG 7.252/20.5/69 K 5.7, ionized calcium 0.90.  Crea 4.91.   Plan> Start on HCO3 gtt in dextrose.  Discontinue LR. Give calcium gluconate 1g IV.

## 2021-12-16 NOTE — Procedures (Signed)
Intubation Procedure Note  Stacey Burns  [id number]  [birth year]  Date:12/16/21  Time:6:09 PM   Provider Performing:Donye Campanelli Audrie Lia    Procedure: Intubation (31500)  Indication(s) Respiratory Failure  Consent Risks of the procedure as well as the alternatives and risks of each were explained to the patient and/or caregiver.  Consent for the procedure was obtained and is signed in the bedside chart   Anesthesia Versed, Fentanyl, Rocuronium, and 2 amps bicarb, 0.94ml epinephrine   Time Out Verified patient identification, verified procedure, site/side was marked, verified correct patient position, special equipment/implants available, medications/allergies/relevant history reviewed, required imaging and test results available.   Sterile Technique Usual hand hygeine, masks, and gloves were used   Procedure Description Patient positioned in bed supine.  Sedation given as noted above.  Patient was intubated with endotracheal tube using Glidescope.  View was Grade 1 full glottis .  Number of attempts was 1.  Colorimetric CO2 detector was consistent with tracheal placement.   Complications/Tolerance None; patient tolerated the procedure well. Chest X-ray is ordered to verify placement.   EBL Minimal   Specimen(s) None  [name], DO 12/16/21 6:10 PM [facility]

## 2021-12-16 NOTE — IPAL (Signed)
  Interdisciplinary Goals of Care Family Meeting   Date carried out:: 12/16/2021  Location of the meeting: Bedside  Member's involved: Physician, Bedside Registered Nurse, and Family Member or next of kin  Durable Power of Attorney or acting medical decision maker: daughter, son  Discussion: We discussed goals of care for [name] .  Daughter, son, brother, sister at bedside and her brother Noralyn Pick was available by phone. We discussed her continued decline, especially her declining mental status and my concern that she is likely to continue to decline. We are at the point of needing to make a decision on the vent. Her brother agrees that it is inevitable at this point if we plan to continue aggressively treating her. Her son and daughter consented for intubation. They want to give her every opportunity to recover and want her to remain full code at this time.   Code status: Full Code  Disposition: Continue current acute care   Time spent for the meeting: 15 min.  [name] 12/16/2021, 5:39 PM

## 2021-12-16 NOTE — Procedures (Signed)
Central Venous Catheter Insertion Procedure Note  Jasie Meleski  [id number]  1955/12/31  Date:12/16/21  Time:12:09 PM   Provider Performing:Worthy Boschert Audrie Lia   Procedure: Insertion of Non-tunneled Central Venous Catheter(36556)with US guidance (40981)    Indication(s) Hemodialysis  Consent Risks of the procedure as well as the alternatives and risks of each were explained to the patient and/or caregiver.  Consent for the procedure was obtained and is signed in the bedside chart  Anesthesia Topical only with 1% lidocaine   Timeout Verified patient identification, verified procedure, site/side was marked, verified correct patient position, special equipment/implants available, medications/allergies/relevant history reviewed, required imaging and test results available.  Sterile Technique Maximal sterile technique including full sterile barrier drape, hand hygiene, sterile gown, sterile gloves, mask, hair covering, sterile ultrasound probe cover (if used).  Procedure Description Area of catheter insertion was cleaned with chlorhexidine and draped in sterile fashion.   With real-time ultrasound guidance a HD catheter was placed into the left internal jugular vein.  Guidewire was confirmed in the left IJ compressible vein prior to insertion of dilators and catheter.  Nonpulsatile blood flow and easy flushing noted in all ports.  The catheter was sutured in place and sterile dressing applied.  Complications/Tolerance None; patient tolerated the procedure well. Chest X-ray is ordered to verify placement for internal jugular or subclavian cannulation.  Chest x-ray is not ordered for femoral cannulation.  EBL Minimal  Specimen(s) None  Follow up CXR confirms line position and shows air filled HH.  [name], DO 12/16/21 12:10 PM [facility]

## 2021-12-16 NOTE — Progress Notes (Addendum)
eLink Physician-Brief Progress Note [name] DOB: 1956/04/02 MRN: [id number]   Date of Service  12/16/2021  HPI/Events of Note  Notified that patient vomited again.   Pt is on max dose of levophed peripherally.   Pt also has not voided any urine.   eICU Interventions  Insert NGT and get KUB. Start on vasopressin for BP support in addition to levophed.  Check bladder scan.  Give 2 units FFP and plan for central line placement.      Intervention Category Major Interventions: Other:  Larinda Buttery 12/16/2021, 1:10 AM  1:20 AM Updated the son at the bedside that the patient is critically ill.  I have informed him of the plan to give FFP prior to central line placement. We will insert NGT and check bladder scan.   I have also informed the patient's son the possibility of the patient needing intubation if her respiratory status deteriorates.

## 2021-12-16 NOTE — Progress Notes (Signed)
PHARMACY - PHYSICIAN COMMUNICATION CRITICAL VALUE ALERT - BLOOD CULTURE IDENTIFICATION (BCID)  Stacey Burns is an 66 y.o. female who presented to [facility] on 21-Dec-2021 with a chief complaint of abdominal pain and sepsis  Assessment:   1/2 blood cultures growing Staphylococcus species, likely contaminant  Name of physician (or Provider) Contacted:  Dr. Valora Piccolo  Current antibiotics: Rocephin and Flagyl   Changes to prescribed antibiotics recommended:  No changes at this time  Results for orders placed or performed during the hospital encounter of 12-21-21  Blood Culture ID Panel (Reflexed) (Collected: 2021-12-21 10:08 AM)  Result Value Ref Range   Enterococcus faecalis NOT DETECTED NOT DETECTED   Enterococcus Faecium NOT DETECTED NOT DETECTED   Listeria monocytogenes NOT DETECTED NOT DETECTED   Staphylococcus species DETECTED (A) NOT DETECTED   Staphylococcus aureus (BCID) NOT DETECTED NOT DETECTED   Staphylococcus epidermidis NOT DETECTED NOT DETECTED   Staphylococcus lugdunensis NOT DETECTED NOT DETECTED   Streptococcus species NOT DETECTED NOT DETECTED   Streptococcus agalactiae NOT DETECTED NOT DETECTED   Streptococcus pneumoniae NOT DETECTED NOT DETECTED   Streptococcus pyogenes NOT DETECTED NOT DETECTED   A.calcoaceticus-baumannii NOT DETECTED NOT DETECTED   Bacteroides fragilis NOT DETECTED NOT DETECTED   Enterobacterales NOT DETECTED NOT DETECTED   Enterobacter cloacae complex NOT DETECTED NOT DETECTED   Escherichia coli NOT DETECTED NOT DETECTED   Klebsiella aerogenes NOT DETECTED NOT DETECTED   Klebsiella oxytoca NOT DETECTED NOT DETECTED   Klebsiella pneumoniae NOT DETECTED NOT DETECTED   Proteus species NOT DETECTED NOT DETECTED   Salmonella species NOT DETECTED NOT DETECTED   Serratia marcescens NOT DETECTED NOT DETECTED   Haemophilus influenzae NOT DETECTED NOT DETECTED   Neisseria meningitidis NOT DETECTED NOT DETECTED   Pseudomonas aeruginosa NOT DETECTED  NOT DETECTED   Stenotrophomonas maltophilia NOT DETECTED NOT DETECTED   Candida albicans NOT DETECTED NOT DETECTED   Candida auris NOT DETECTED NOT DETECTED   Candida glabrata NOT DETECTED NOT DETECTED   Candida krusei NOT DETECTED NOT DETECTED   Candida parapsilosis NOT DETECTED NOT DETECTED   Candida tropicalis NOT DETECTED NOT DETECTED   Cryptococcus neoformans/gattii NOT DETECTED NOT DETECTED    Eddie Candle 12/16/2021  5:14 AM

## 2021-12-16 NOTE — Consult Note (Addendum)
Reason for Consult: Acute kidney injury, metabolic acidosis, hyperkalemia Referring Physician: Karie Fetch, DO (CCM)  HPI:  66 year old woman with past medical history significant for coronary artery disease, paroxysmal atrial fibrillation on anticoagulation with Xarelto, recent pacemaker placement in May 2023, type 2 diabetes mellitus requiring insulin, systemic lupus erythematosus on treatment with Imuran, hypertension, history of CVA, obstructive sleep apnea on CPAP, dyslipidemia, history of CVA and apparently normal renal function at baseline based on labs from May. She presented to the emergency room with 3-day history of increasing nausea/vomiting and poor oral intake with ongoing lisinopril use and on evaluation found to have acute kidney injury with a creatinine of 5.6 that transiently improved with fluids but is back up again to 5.5 with profound anion gap metabolic acidosis that is minimally responsive to bicarbonate drip as well as rising potassium.  Additional labs show evidence of shock liver and thrombocytopenia from sepsis.  She had a CT angiogram of the abdomen and pelvis that showed faint peripancreatic edema, a large hiatal hernia with most of the stomach being intrathoracic and possible colitis without evidence of ischemic colitis.  She has been anuric overnight.  When seen this morning she was having problems with shortness of breath and is being repositioned.  She appears somnolent.  Her brother who is in the medical field in the [place] area was at bedside and offered history/discussed management plan.  Foley catheter placed and renal ultrasound pending.  Past Medical History:  Diagnosis Date   Asthma    Coronary artery disease    CPAP (continuous positive airway pressure) dependence    Diabetes mellitus without complication (HCC)    Encounter for care of pacemaker 11/30/2021   Hypertension    Lupus (HCC)    Pacemaker: Biotronik Edora Dual chamber Pacemaker 10/13/2021  11/30/2021   Paroxysmal atrial fibrillation (HCC)    Sinus node dysfunction (HCC)    Stroke [facility])     Past Surgical History:  Procedure Laterality Date   ATRIAL FIBRILLATION ABLATION     CHOLECYSTECTOMY     PACEMAKER IMPLANT N/A 10/13/2021   Procedure: PACEMAKER IMPLANT;  Surgeon: Lanier Prude, MD;  Location: [facility];  Service: Cardiovascular;  Laterality: N/A;    Family History  Problem Relation Age of Onset   Stroke Mother        blood clot in brain   Breast cancer Sister    Breast cancer Sister     Social History:  reports that she has never smoked. She has never used smokeless tobacco. She reports that she does not drink alcohol and does not use drugs.  Allergies:  Allergies  Allergen Reactions   Penicillins Hives and Swelling    Did it involve swelling of the face/tongue/throat, SOB, or low BP? Yes Did it involve sudden or severe rash/hives, skin peeling, or any reaction on the inside of your mouth or nose? No Did you need to seek medical attention at a hospital or doctor's office? No When did it last happen? <less than 10 years      If all above answers are "NO", may proceed with cephalosporin use.    Medications: I have reviewed the patient's current medications. Scheduled:  sodium chloride   Intravenous Once   sodium chloride   Intravenous Once   arformoterol  15 mcg Nebulization BID   budesonide (PULMICORT) nebulizer solution  0.25 mg Nebulization BID   Chlorhexidine Gluconate Cloth  6 each Topical Daily   insulin aspart  0-15 Units Subcutaneous  Q4H   methylPREDNISolone (SOLU-MEDROL) injection  125 mg Intravenous BID   pantoprazole (PROTONIX) IV  40 mg Intravenous Q24H   revefenacin  175 mcg Nebulization Daily   sodium chloride flush  10-40 mL Intracatheter Q12H   vancomycin variable dose per unstable renal function (pharmacist dosing)   Does not apply See admin instructions   Continuous:  sodium chloride Stopped (12/16/21 0047)   sodium  chloride     cefTRIAXone (ROCEPHIN)  IV     metronidazole Stopped (12/18/2021 2216)   norepinephrine (LEVOPHED) Adult infusion 5 mcg/min (12/16/21 0407)   phenylephrine (NEO-SYNEPHRINE) Adult infusion 20 mcg/min (12/16/21 1610)   sodium bicarbonate 150 mEq in dextrose 5 % 1,150 mL infusion 50 mL/hr at 12/16/21 0406   sodium chloride     vancomycin 2,500 mg (12/16/21 0959)       Latest Ref Rng & Units 12/16/2021    7:44 AM 12/16/2021    3:43 AM 12/16/2021   12:08 AM  BMP  Glucose 70 - 99 mg/dL  960    BUN 8 - 23 mg/dL  45    Creatinine 4.54 - 1.00 mg/dL  0.98    Sodium 119 - 147 mmol/L 137  138  137   Potassium 3.5 - 5.1 mmol/L 6.5  5.4  5.7   Chloride 98 - 111 mmol/L  109    CO2 22 - 32 mmol/L  10    Calcium 8.9 - 10.3 mg/dL  6.8        Latest Ref Rng & Units 12/16/2021    9:05 AM 12/16/2021    7:44 AM 12/16/2021    3:43 AM  CBC  WBC 4.0 - 10.5 K/uL   5.4   Hemoglobin 12.0 - 15.0 g/dL  82.9  56.2   Hematocrit 36.0 - 46.0 %  32.0  29.9   Platelets 150 - 400 K/uL 100   93    DG CHEST PORT 1 VIEW  Result Date: 12/16/2021 CLINICAL DATA:  Check central line placement EXAM: PORTABLE CHEST 1 VIEW COMPARISON:  Film from the previous day. FINDINGS: Cardiac shadow is enlarged but stable. Aortic calcifications are seen. Pacing device is again noted. Gastric catheter has been advanced into the stomach although a large hiatal hernia is noted. Right jugular central line is noted extending into the superior right atrium. No pneumothorax is seen. Mild vascular congestion is noted. IMPRESSION: No pneumothorax following central line placement. Large hiatal hernia with gastric catheter within. Mild vascular congestion. Electronically Signed   By: Alcide Clever M.D.   On: 12/16/2021 03:53   DG Abd 1 View  Result Date: 12/16/2021 CLINICAL DATA:  NG tube placement. EXAM: ABDOMEN - 1 VIEW COMPARISON:  12/23/2021. FINDINGS: Examination is limited due to single view obtained. The bowel gas pattern is normal. An  enteric tube terminates at the right lung base in the region of a known large hiatal hernia. No renal calculi. Surgical clips are noted in the right upper quadrant. A pacemaker device is present over the left chest. Mild strandy atelectasis is noted at the lung bases. IMPRESSION: Enteric tube terminates at the right lung base in the region of known large hiatal hernia. Electronically Signed   By: Thornell Sartorius M.D.   On: 12/16/2021 01:52   CT Angio Abd/Pel w/ and/or w/o  Result Date: 12/28/2021 CLINICAL DATA:  Mesenteric ischemia, acute. Evaluate ischemic colitis. EXAM: CTA ABDOMEN AND PELVIS WITHOUT AND WITH CONTRAST TECHNIQUE: Multidetector CT imaging of the abdomen and pelvis was performed using  the standard protocol during bolus administration of intravenous contrast. Multiplanar reconstructed images and MIPs were obtained and reviewed to evaluate the vascular anatomy. RADIATION DOSE REDUCTION: This exam was performed according to the departmental dose-optimization program which includes automated exposure control, adjustment of the mA and/or kV according to patient size and/or use of iterative reconstruction technique. CONTRAST:  OMNIPAQUE IOHEXOL 350 MG/ML SOLN COMPARISON:  Noncontrast CT earlier today. FINDINGS: VASCULAR Aorta: Normal caliber aorta without aneurysm, dissection, vasculitis or significant stenosis. Mild to moderate atherosclerosis. Celiac: Patent without evidence of aneurysm, dissection, vasculitis or significant stenosis. The splenic artery may arise directly from the upper abdominal aorta and is diminutive. SMA: Patent without evidence of aneurysm, dissection, vasculitis or significant stenosis. Renals: Small in caliber. No severe stenosis. Early branching of the right renal artery. IMA: Poorly opacified, unclear if this is due to small size. There is no evidence of embolic disease. Inflow: Patent without evidence of aneurysm, dissection, vasculitis or significant stenosis. Proximal  Outflow: Bilateral common femoral and visualized portions of the superficial and profunda femoral arteries are patent without evidence of aneurysm, dissection, vasculitis or significant stenosis. Vessels are small in caliber. Veins: No acute findings on venous phase imaging. No portal venous gas. Review of the MIP images confirms the above findings. NON-VASCULAR Lower chest: Again seen large hiatal hernia. The entire chest was evaluated on CT earlier today. No significant change from that exam. Hepatobiliary: No focal hepatic abnormality cholecystectomy without biliary dilatation. Pancreas: Faint peripancreatic edema is again seen. No ductal dilatation or evidence of focal pancreatic abnormality Spleen: Normal in size without focal abnormality. Adrenals/Urinary Tract: No adrenal nodule. No hydronephrosis. No focal renal abnormality. Urinary bladder is empty. Stomach/Bowel: The majority of the stomach is intrathoracic. Dependent density in the stomach is felt to be related to ingested material rather than active GI bleed, and is unchanged on arterial and venous phase. The entire stomach is not included in the field of view, but was seen on chest CT earlier today. Ingested material within the descending colon does not represent active GI hemorrhage in was seen on noncontrast exam earlier today, series 5, image 71. There is possible wall thickening of the cecum and proximal ascending colon, series 5 images 56 and 66, similar pericolonic edema to earlier today in this region. There is no definite colonic inflammation at the splenic flexure in a location typical for ischemic colitis. Scattered colonic diverticula, without focal diverticulitis. Normal appendix. No small bowel inflammation. No bowel pneumatosis. Lymphatic: No abdominopelvic adenopathy. Reproductive: Small calcified uterine fibroids. Quiescent ovaries, no adnexal mass. Other: Mild fat stranding about the mesentery and retroperitoneum. No ascites or free  air. No abdominal wall hernia. Musculoskeletal: Stable lumbar degenerative change. No acute findings. IMPRESSION: 1. Aortic atherosclerosis. Small caliber aortic branches including the mesenteric arteries. The IMA is poorly opacified, likely due to small caliber rather than occlusion. No evidence of embolic disease. There are no findings in the IMA vascular distribution of the colon to suggest ischemic colitis. 2. Questionable wall thickening of the cecum and proximal ascending colon in the area of previous pericolonic edema. Favor mild colitis. Location is typical of infectious or inflammatory process. There is no definite colonic inflammation at the splenic flexure in a location typical for ischemic colitis. 3. Colonic diverticulosis. 4. Large hiatal hernia with the entire stomach being intrathoracic. The entire stomach is not included in the field of view, but was included on chest CT earlier today. 5. Faint peripancreatic edema is again seen, nonspecific but  can be seen in the setting of acute pancreatitis. Recommend correlation with pancreatic enzymes. Aortic Atherosclerosis (ICD10-I70.0). Electronically Signed   By: Narda RutherfordMelanie  Sanford M.D.   On: 12/25/2021 20:30   US Abdomen Limited RUQ (LIVER/GB)  Result Date: 01/11/2022 CLINICAL DATA:  Elevated total bilirubin levels. EXAM: ULTRASOUND ABDOMEN LIMITED RIGHT UPPER QUADRANT COMPARISON:  CT from earlier today FINDINGS: Gallbladder: Status post cholecystectomy. Common bile duct: Diameter: 3.4 mm.  No signs of intrahepatic bile duct dilatation. Liver: No focal lesion identified. Within normal limits in parenchymal echogenicity. Portal vein is patent on color Doppler imaging with normal direction of blood flow towards the liver. Other: Exam detail diminished secondary to body habitus. IMPRESSION: 1. No acute findings.  No bile duct dilatation identified. 2. Status post cholecystectomy. Electronically Signed   By: Signa Kellaylor  Stroud M.D.   On: 01/03/2022 14:45   CT  CHEST ABDOMEN PELVIS WO CONTRAST  Result Date: 12/21/2021 CLINICAL DATA:  Abnormal chest radiograph. Patient's symptoms including abdominal pain and possible sepsis. EXAM: CT CHEST, ABDOMEN AND PELVIS WITHOUT CONTRAST TECHNIQUE: Multidetector CT imaging of the chest, abdomen and pelvis was performed following the standard protocol without IV contrast. RADIATION DOSE REDUCTION: This exam was performed according to the departmental dose-optimization program which includes automated exposure control, adjustment of the mA and/or kV according to patient size and/or use of iterative reconstruction technique. COMPARISON:  Chest radiograph of 12/22/2021. Chest abdomen and pelvic CTs of 01/15/2019. FINDINGS: CT CHEST FINDINGS Cardiovascular: Right atrial and ventricular pacer. Aortic atherosclerosis. Mild cardiomegaly, without pericardial effusion. Lad coronary artery calcification. Mediastinum/Nodes: No mediastinal or definite hilar adenopathy, given limitations of unenhanced CT. Large hiatal hernia, with nearly the entire stomach positioned in the low right chest. Similar in size to on the prior. No complicating obstruction. Increased density within the herniated fat is mild, including on 40/3. Lungs/Pleura: Trace right pleural fluid is similar. Right lower lobe dependent atelectasis and volume loss, slightly increased since 2020. No lobar consolidation. Musculoskeletal: No acute osseous abnormality. CT ABDOMEN PELVIS FINDINGS Hepatobiliary: Normal noncontrast appearance of the liver. Cholecystectomy, without biliary ductal dilatation. Pancreas: No pancreatic duct dilatation. Subtle edema within the peripancreatic fat including on 54/3. Spleen: Normal in size, without focal abnormality. Adrenals/Urinary Tract: Normal adrenal glands. No renal calculi or hydronephrosis. No hydroureter or ureteric calculi. No bladder calculi. Stomach/Bowel: Primarily herniated into the lower chest, as detailed above. Scattered colonic  diverticula. Suspect somewhat more focal edema adjacent the ascending colon including on 72/3. Normal terminal ileum and appendix.  Normal small bowel. Vascular/Lymphatic: Aortic atherosclerosis. No abdominopelvic adenopathy. Reproductive: Calcified uterine fibroids. Other: No significant free fluid.  No free intraperitoneal air. Musculoskeletal: Osteopenia.  Degenerate disc disease at L4-5. IMPRESSION: 1. Decreased sensitivity exam secondary to lack of oral or IV contrast. 2. Relatively diffuse intraabdominal edema, decreasing sensitivity for focal areas of inflammation. 3. Large hiatal hernia, with the majority of the stomach positioned in the lower chest. Subtle edema within the herniated fat is nonspecific, especially given above diffuse edema. Correlate with symptoms of inflammation or even ischemia of the herniated stomach. 4. Similarly, mild peripancreatic edema for which pancreatitis cannot be excluded. 5. More focal edema about the ascending colon could represent colitis or (given scattered diverticula) diverticulitis. 6. Small volume uterine fibroids. 7. Coronary artery atherosclerosis. Aortic Atherosclerosis (ICD10-I70.0). Electronically Signed   By: Jeronimo GreavesKyle  Talbot M.D.   On: 01/08/2022 12:04   DG Chest Port 1 View  Result Date: 12/16/2021 CLINICAL DATA:  Abdominal pain, possible sepsis EXAM: PORTABLE CHEST 1  VIEW COMPARISON:  10/14/2021 FINDINGS: Dual lead pacer noted. Increased appreciable prominence of abnormal right retrocardiac and pericardiac density, most likely attributable to the large hiatal hernia shown on 01/15/2019. Atherosclerotic calcification of the aortic arch. Underlying mild to moderate enlargement of the cardiopericardial silhouette. The lungs appear otherwise clear. IMPRESSION: 1. Increased prominence of right retrocardiac density. While most likely attributable to the patient's large right eccentric hiatal hernia, I cannot exclude superimposed pneumonia or underlying lesion medially  in the right lung base. This could be further characterized with chest CT if clinically warranted. 2.  Aortic Atherosclerosis (ICD10-I70.0). 3. At least mild enlargement of the cardiopericardial silhouette, without pulmonary edema. 4. Dual lead pacer. Electronically Signed   By: Gaylyn Rong M.D.   On: Jan 09, 2022 09:34    Review of Systems  Unable to perform ROS: Acuity of condition   Blood pressure (!) 79/57, pulse 84, temperature 97.8 F (36.6 C), temperature source Axillary, resp. rate (!) 21, height 5\' 4"  (1.626 m), weight 116.7 kg, SpO2 92 %. Physical Exam Vitals and nursing note reviewed.  Constitutional:      General: She is in acute distress.     Appearance: She is well-developed and normal weight. She is ill-appearing and toxic-appearing.  HENT:     Head: Normocephalic and atraumatic.     Mouth/Throat:     Pharynx: Oropharynx is clear.     Comments: Dry mucosal membranes/mouth Eyes:     General: No scleral icterus. Cardiovascular:     Rate and Rhythm: Normal rate and regular rhythm.     Heart sounds: Normal heart sounds.  Pulmonary:     Breath sounds: Normal breath sounds. No wheezing or rales.  Abdominal:     General: Bowel sounds are normal. There is distension.     Palpations: Abdomen is soft.     Tenderness: There is generalized abdominal tenderness.     Hernia: A hernia is present.  Skin:    General: Skin is warm and dry.  Neurological:     General: No focal deficit present.     Assessment/Plan: 1.  Acute kidney injury: Anuric and appears to have been initially what appears to have been a prerenal state and likely involved ischemic ATN in the setting of her hypotension/sepsis.  Anticipate some additional injury from iodinated intravenous contrast exposure.  With her anuric state and unresponsiveness to fluids overnight as well as worsening metabolic acidosis, recommend beginning CRRT at this time (discussed with Dr. who will assist with placing dialysis  catheter).  I discussed my findings and updated her brother Dr.Carroll Chestine Spore who was at bedside.  We will continue to follow her urine output and serial labs.  Avoid nephrotoxic medications including NSAIDs and iodinated intravenous contrast exposure unless the latter is absolutely indicated.  Preferred narcotic agents for pain control are hydromorphone, fentanyl, and methadone. Morphine should not be used. Avoid Baclofen and avoid oral sodium phosphate and magnesium citrate based laxatives / bowel preps. Continue strict Input and Output monitoring. Will monitor the patient closely with you and intervene or adjust therapy as indicated by changes in clinical status/labs. 2.  Combined non-anion gap/gap metabolic acidosis: Secondary to GI losses and acute kidney injury with suspicion of an additional intra-abdominal process likely contributing to this based on refractoriness to medical management so far.  Will undertake CRRT with pre and post filter bicarbonate replacement to help alkalinize her as quickly as possible to help improve respiratory status/vasoreactivity. 3.  Hyperkalemia: Secondary to acute kidney injury, monitor  with CRRT and correction of metabolic acidosis. 4.  Severe sepsis/septic shock: Suspected to have intra-abdominal source of sepsis and currently on broad-spectrum antimicrobial therapy with vancomycin, ceftriaxone and metronidazole in the setting of preceding immunosuppression with azathioprine.  Source identification pending along with cultures.  She is on pressor support with Levophed and Neo-Synephrine. 5.  Shock liver: Secondary to sepsis/hypotension, continue to manage supportively with serial lab surveillance. 6.  History of SLE: On immunosuppression with azathioprine and not on corticosteroids prior to admission. 7.  Acute hypoxic respiratory failure: Secondary to severe sepsis and likely compounded by tachypnea of metabolic acidosis.  Zadin Lange K. 12/16/2021, 10:14 AM

## 2021-12-16 NOTE — Progress Notes (Addendum)
Pharmacy Antibiotic Note  Stacey Burns is a 66 y.o. female admitted on 2022/01/08 with possible intra-abdominal infection (colitis or diverticulitis) and noted on pressors. Pharmacy has been consulted for vancomycin dosing. She is also on rocephin and flagyl. Plans noted for CRRT -WBC= 5.4, afebrile, SCr= 5.48 (baseline ~ 0.9-1.0) -blood cultures with GPC in 1/2 bottles and BCID with staph species (possible contaminant)  Plan: -Vancomycin 2500mg  IV x1 followed by 1250mg  IV q24hr -Check a random vancomycin level in 7/5  Height: 5\' 4"  (162.6 cm) Weight: 116.7 kg (257 lb 4.4 oz) IBW/kg (Calculated) : 54.7  Temp (24hrs), Avg:97.3 F (36.3 C), Min:95.5 F (35.3 C), Max:98 F (36.7 C)  Recent Labs  Lab Jan 08, 2022 1008 01/08/22 1024 January 08, 2022 1203 08-Jan-2022 1551 01/08/2022 2346 12/16/21 0343 12/16/21 0512  WBC 4.8  --   --   --   --  5.4  --   CREATININE 5.07* 5.60*  --  4.91* 5.20* 5.48*  --   LATICACIDVEN 5.5*  --  6.2* 8.0* 8.1*  --  8.9*    Estimated Creatinine Clearance: 12.7 mL/min (A) (by C-G formula based on SCr of 5.48 mg/dL (H)).    Allergies  Allergen Reactions   Penicillins Hives and Swelling    Did it involve swelling of the face/tongue/throat, SOB, or low BP? Yes Did it involve sudden or severe rash/hives, skin peeling, or any reaction on the inside of your mouth or nose? No Did you need to seek medical attention at a hospital or doctor's office? No When did it last happen? <less than 10 years      If all above answers are "NO", may proceed with cephalosporin use.     Antimicrobials this admission: CTX 7/4>> Vanc 7/4>> Levaquin 7/3 >> 7/4 Flagyl 7/3 >>  Thank you for allowing pharmacy to be a part of this patient's care.  02/15/22, PharmD Clinical Pharmacist **Pharmacist phone directory can now be found on [email or web address] ([email or web address]).  Listed under [facility].

## 2021-12-16 NOTE — Progress Notes (Signed)
NAME:  Stacey Burns, MRN:  [id number], DOB:  [birth year], LOS: 1 ADMISSION DATE:  12/19/2021, CONSULTATION DATE:  12/14/2021 REFERRING MD:  Roderic Palau, CHIEF COMPLAINT:  Diverticulitis vs colitis with dehydration    History of Present Illness:  66 year old female former smoker ( 32.3 pack year smoking history  quit 1991) with PMH of DM II, Asthma,  CAD, OSA on CPAP , History of COVID 19 infection, pacemaker ( 10/2021) , HTN, Hyperlipidemia Lupus, PAF, stroke, and  lung nodules on recent imaging  who presents to the ED after 3 days at home with abdominal pain, nausea, and vomiting . She does have a history or recent falls. She is currently taking Imuran for her Lupus and Xaralto for atrial fibrillation.  Work up in the ED was significant for Lactic acid initially 5.5 but increased to 6.2 ( After fluids), Na 137, K 5.8 , BUN of 51, Creatinine of 5.07 which increased to 5.60 after fluids, HGB 11.2, HCT 33, WBC 4.8, Platelets 95,000, Calcium 7.2, Total protein 4.8, Albumin 1.6, AST 962, ALT 396, Alk Phos 243, INR > 10, of Total Bili 3.8, GFR 9., Calcium corrects to 9.1 with albumin of 1.6 CT Chest Abdomen Pelvis was significant for diffuse intraabdominal edema, decreasing sensitivity for focal areas of inflammation, large hiatal hernia ( similar to previous imaging) , mild peripancreatic edema ?? pancreatitis , focal edema about the ascending colon could represent colitis or (given scattered diverticula) diverticulitis.Small volume uterine fibroids.  PCCM were asked to admit patient and manage care.  Pertinent  Medical History   Past Medical History:  Diagnosis Date   Asthma    Coronary artery disease    CPAP (continuous positive airway pressure) dependence    Diabetes mellitus without complication ([place])    Encounter for care of pacemaker 11/30/2021   Hypertension    Lupus ([place])    Pacemaker: Biotronik [place] Dual chamber Pacemaker 10/13/2021 11/30/2021   Paroxysmal atrial fibrillation ([facility])     Sinus node dysfunction ([place])    Stroke ([place])      Significant Hospital Events: Including procedures, antibiotic start and stop dates in addition to other pertinent events   01/05/2022 >> Admission, CVC placed.   Interim History / Subjective:  Escalating pressors overnight, CVC placed.  She complains of shortness of breath and ongoing abdominal pain.  Objective   Blood pressure (!) 79/57, pulse 84, temperature 97.8 F (36.6 C), temperature source Axillary, resp. rate (!) 21, height '5\' 4"'  (1.626 m), weight 116.7 kg, SpO2 92 %. CVP:  [11 mmHg-16 mmHg] 16 mmHg      Intake/Output Summary (Last 24 hours) at 12/16/2021 0726 Last data filed at 12/16/2021 0357 Gross per 24 hour  Intake 6835.75 ml  Output 210 ml  Net 6625.75 ml    Filed Weights   12/30/2021 0904 12/16/21 0500  Weight: 113.4 kg 116.7 kg    Examination: General: Ill-appearing woman sitting up in bed in no acute distress HENT: [place]/AT, eyes anicteric Lungs: Tachypneic, no accessory muscle use.  CTAB. Cardiovascular: S1-S2, paced rhythm on tele, regular rate and rhythm Abdomen: Soft, diffusely tender to palpation, no guarding Extremities: No peripheral edema, no cyanosis Neuro: Awake and alert, moving all extremities.  Answering questions appropriately. Derm: Warm, dry, no diffuse rashes  K+ 5.4 Bicarb 10  BUN 45 Cr 5.48 Lipase 87 AST 1015 ALT 398 T bili 4.8 WBC 5.4 H/H 10.4/29.9 Platelets 93 INR 7.1 Urine culture> pending Blood cultures> GPC 2/4  Ct scans reviewed.  Resolved Hospital Problem list     Assessment & Plan:  Septic shock due to intraabdominal source of sepsis- exact cause unknown. Concern for ischemic colitis not supported by CT scan.  CT continues to demonstrate nonspecific areas of colon inflammation, mild pancreatic inflammation.  Severe hiatal hernia could lead to strangulation of the stomach, but CT scan has not suggestive of this. Immunocompromised due to SLE, on chronic on Imuran -Adding  vancomycin, continue Flagyl and ceftriaxone. -Adding stress dose steroids - Serial lactates - Additional 1 L normal saline now - Continue sodium bicarb - Appreciate surgery's management-no surgical intervention indicated at this time. - Continue NG tube to suction  Severe metabolic acidosis, lactic acidosis Hyperkalemia AKI-due to sepsis, at risk from receiving CT contrast on 7/3 -Consult nephrology, concerned she may require CRRT - Strict I's/O - Renally dose meds and avoid nephrotoxic meds -Continue to monitor -Continue bicarb drip -Place Foley catheter  Elevated transaminases, hyperbilirubinemia  Coagulopathy  -IV vitamin K - Check DIC panel - If requires dialysis access will require additional FFP - Continue to monitor INR - Continue to hold Xarelto  Thrombocytopenia due to sepsis Anemia -monitor -check DIC panel -transfuse for Hb <7 or platelets <10  Hx. Hypertension -Hold PTA antihypertensives  DM II with controlled hyperglycemia; A1c 6.6 in 09/2021 -SSI as needed -goal BG 140-180  H/o COPD -Brovana, Yupelri, Pulmicort  SLE, chronically immunocompromised -Hold all immunosuppression - Stress dose steroids  Acute respiratory failure with hypoxia - Supplemental oxygen to maintain SPO2 greater than 90% - Worry that she may end up intubated if she develops progressive respiratory failure on severe encephalopathy due to her sepsis.  Family understands that this is a possibility.  I updated her brother via phone and son at bedside.  They both understand my concern that she has continued to worsen despite broad antibiotics and aggressive supportive care.  Without response to aggressive medical management and without an option for surgical intervention, she is at risk of dying if she continues to develop progressive septic shock.  We are continuing all aggressive measures, but they understand that she may continue to deteriorate despite her current measures.  Her brother  is on his way to [place] from [place] and will be here soon.  Best Practice (right click and "Reselect all SmartList Selections" daily)   Diet/type: NPO DVT prophylaxis: SCD GI prophylaxis: PPI Lines: Central line and Arterial Line Foley:  Yes, and it is still needed Code Status:  full code Last date of multidisciplinary goals of care discussion [ brother and son updated 7.4]  Labs   CBC: Recent Labs  Lab 01/07/2022 1008 12/21/2021 1024 12/16/21 0008 12/16/21 0343  WBC 4.8  --   --  5.4  NEUTROABS 3.6  --   --   --   HGB 10.5* 11.2* 9.9* 10.4*  HCT 31.6* 33.0* 29.0* 29.9*  MCV 106.4*  --   --  105.7*  PLT 95*  --   --  93*     Basic Metabolic Panel: Recent Labs  Lab 01/02/2022 1008 01/08/2022 1024 12/20/2021 1551 12/14/2021 2346 12/16/21 0008 12/16/21 0343  NA 137 137 138 136 137 138  K 5.8* 5.5* 5.8* 5.7* 5.7* 5.4*  CL 112* 110 114* 113*  --  109  CO2 13*  --  11* 10*  --  10*  GLUCOSE 89 81 89 71  --  136*  BUN 42* 51* 39* 43*  --  45*  CREATININE 5.07* 5.60* 4.91* 5.20*  --  5.48*  CALCIUM 7.2*  --  6.9* 6.5*  --  6.8*  MG  --   --  2.1  --   --  2.0    GFR: Estimated Creatinine Clearance: 12.7 mL/min (A) (by C-G formula based on SCr of 5.48 mg/dL (H)). Recent Labs  Lab 12/13/2021 1008 01/08/2022 1203 01/08/2022 1551 01/09/2022 2346 12/16/21 0343  PROCALCITON  --   --  1.83  --   --   WBC 4.8  --   --   --  5.4  LATICACIDVEN 5.5* 6.2* 8.0* 8.1*  --      Liver Function Tests: Recent Labs  Lab 12/28/2021 1008 12/16/21 0343  AST 962* 1,015*  ALT 396* 398*  ALKPHOS 243* 226*  BILITOT 3.8* 4.8*  PROT 4.8* 4.9*  ALBUMIN 1.6* 1.8*    Recent Labs  Lab 01/06/2022 1008 12/24/2021 1551 12/16/21 0343  LIPASE 73* 75* 87*   No results for input(s): "AMMONIA" in the last 168 hours.  ABG    Component Value Date/Time   PHART 7.252 (L) 12/16/2021 0008   PCO2ART 20.5 (L) 12/16/2021 0008   PO2ART 69 (L) 12/16/2021 0008   HCO3 9.1 (L) 12/16/2021 0008   TCO2 10  (L) 12/16/2021 0008   ACIDBASEDEF 16.0 (H) 12/16/2021 0008   O2SAT 92 12/16/2021 0008    This patient is critically ill with multiple organ system failure which requires frequent high complexity decision making, assessment, support, evaluation, and titration of therapies. This was completed through the application of advanced monitoring technologies and extensive interpretation of multiple databases. During this encounter critical care time was devoted to patient care services described in this note for 60 minutes.   Julian Hy, DO 12/16/21 8:34 AM [facility]

## 2021-12-17 DIAGNOSIS — K5792 Diverticulitis of intestine, part unspecified, without perforation or abscess without bleeding: Secondary | ICD-10-CM | POA: Diagnosis not present

## 2021-12-17 DIAGNOSIS — Z9911 Dependence on respirator [ventilator] status: Secondary | ICD-10-CM

## 2021-12-17 DIAGNOSIS — R579 Shock, unspecified: Secondary | ICD-10-CM

## 2021-12-17 DIAGNOSIS — R7881 Bacteremia: Secondary | ICD-10-CM

## 2021-12-17 LAB — BPAM FFP
Blood Product Expiration Date: 202307092359
Blood Product Expiration Date: 202307092359
Blood Product Expiration Date: 202307092359
Blood Product Expiration Date: 202307092359
Blood Product Expiration Date: 202307092359
Blood Product Expiration Date: 202307092359
ISSUE DATE / TIME: 202307040153
ISSUE DATE / TIME: 202307040153
ISSUE DATE / TIME: 202307041033
ISSUE DATE / TIME: 202307041033
ISSUE DATE / TIME: 202307041033
ISSUE DATE / TIME: 202307041033
Unit Type and Rh: 7300
Unit Type and Rh: 7300
Unit Type and Rh: 7300
Unit Type and Rh: 7300
Unit Type and Rh: 7300
Unit Type and Rh: 7300

## 2021-12-17 LAB — PREPARE FRESH FROZEN PLASMA
Unit division: 0
Unit division: 0
Unit division: 0
Unit division: 0
Unit division: 0
Unit division: 0

## 2021-12-17 LAB — POCT I-STAT 7, (LYTES, BLD GAS, ICA,H+H)
Acid-base deficit: 16 mmol/L — ABNORMAL HIGH (ref 0.0–2.0)
Acid-base deficit: 6 mmol/L — ABNORMAL HIGH (ref 0.0–2.0)
Acid-base deficit: 9 mmol/L — ABNORMAL HIGH (ref 0.0–2.0)
Bicarbonate: 11.3 mmol/L — ABNORMAL LOW (ref 20.0–28.0)
Bicarbonate: 16.4 mmol/L — ABNORMAL LOW (ref 20.0–28.0)
Bicarbonate: 18.4 mmol/L — ABNORMAL LOW (ref 20.0–28.0)
Calcium, Ion: 0.69 mmol/L — CL (ref 1.15–1.40)
Calcium, Ion: 0.69 mmol/L — CL (ref 1.15–1.40)
Calcium, Ion: 0.7 mmol/L — CL (ref 1.15–1.40)
HCT: 26 % — ABNORMAL LOW (ref 36.0–46.0)
HCT: 27 % — ABNORMAL LOW (ref 36.0–46.0)
HCT: 29 % — ABNORMAL LOW (ref 36.0–46.0)
Hemoglobin: 8.8 g/dL — ABNORMAL LOW (ref 12.0–15.0)
Hemoglobin: 9.2 g/dL — ABNORMAL LOW (ref 12.0–15.0)
Hemoglobin: 9.9 g/dL — ABNORMAL LOW (ref 12.0–15.0)
O2 Saturation: 93 %
O2 Saturation: 95 %
O2 Saturation: 96 %
Patient temperature: 31.9
Patient temperature: 32.3
Patient temperature: 96.4
Potassium: 4.2 mmol/L (ref 3.5–5.1)
Potassium: 4.8 mmol/L (ref 3.5–5.1)
Potassium: 6 mmol/L — ABNORMAL HIGH (ref 3.5–5.1)
Sodium: 134 mmol/L — ABNORMAL LOW (ref 135–145)
Sodium: 135 mmol/L (ref 135–145)
Sodium: 138 mmol/L (ref 135–145)
TCO2: 12 mmol/L — ABNORMAL LOW (ref 22–32)
TCO2: 17 mmol/L — ABNORMAL LOW (ref 22–32)
TCO2: 19 mmol/L — ABNORMAL LOW (ref 22–32)
pCO2 arterial: 25.5 mmHg — ABNORMAL LOW (ref 32–48)
pCO2 arterial: 25.9 mmHg — ABNORMAL LOW (ref 32–48)
pCO2 arterial: 28.9 mmHg — ABNORMAL LOW (ref 32–48)
pH, Arterial: 7.193 — CL (ref 7.35–7.45)
pH, Arterial: 7.386 (ref 7.35–7.45)
pH, Arterial: 7.446 (ref 7.35–7.45)
pO2, Arterial: 49 mmHg — ABNORMAL LOW (ref 83–108)
pO2, Arterial: 60 mmHg — ABNORMAL LOW (ref 83–108)
pO2, Arterial: 92 mmHg (ref 83–108)

## 2021-12-17 LAB — DIC (DISSEMINATED INTRAVASCULAR COAGULATION)PANEL
D-Dimer, Quant: 3.6 ug/mL-FEU — ABNORMAL HIGH (ref 0.00–0.50)
Fibrinogen: 268 mg/dL (ref 210–475)
INR: 10.4 (ref 0.8–1.2)
Platelets: 54 10*3/uL — ABNORMAL LOW (ref 150–400)
Prothrombin Time: 81.6 seconds — ABNORMAL HIGH (ref 11.4–15.2)
aPTT: >200 seconds (ref 24–36)

## 2021-12-17 LAB — RENAL FUNCTION PANEL
Albumin: 1.6 g/dL — ABNORMAL LOW (ref 3.5–5.0)
Anion gap: 27 — ABNORMAL HIGH (ref 5–15)
BUN: 24 mg/dL — ABNORMAL HIGH (ref 8–23)
CO2: 17 mmol/L — ABNORMAL LOW (ref 22–32)
Calcium: 5.6 mg/dL — CL (ref 8.9–10.3)
Chloride: 91 mmol/L — ABNORMAL LOW (ref 98–111)
Creatinine, Ser: 3.04 mg/dL — ABNORMAL HIGH (ref 0.44–1.00)
GFR, Estimated: 16 mL/min — ABNORMAL LOW (ref >60)
Glucose, Bld: 62 mg/dL — ABNORMAL LOW (ref 70–99)
Phosphorus: 6.8 mg/dL — ABNORMAL HIGH (ref 2.5–4.6)
Potassium: 4.7 mmol/L (ref 3.5–5.1)
Sodium: 135 mmol/L (ref 135–145)

## 2021-12-17 LAB — GLUCOSE, CAPILLARY
Glucose-Capillary: 62 mg/dL — ABNORMAL LOW (ref 70–99)
Glucose-Capillary: 92 mg/dL (ref 70–99)
Glucose-Capillary: 109 mg/dL — ABNORMAL HIGH (ref 70–99)

## 2021-12-17 LAB — MAGNESIUM: Magnesium: 2 mg/dL (ref 1.7–2.4)

## 2021-12-17 LAB — APTT: aPTT: >200 seconds (ref 24–36)

## 2021-12-17 MED ORDER — GLYCOPYRROLATE 1 MG PO TABS: 1.0000 mg | ORAL_TABLET | ORAL | Status: DC | PRN

## 2021-12-17 MED ORDER — GLYCOPYRROLATE 0.2 MG/ML IJ SOLN: 0.2000 mg | INTRAMUSCULAR | Status: DC | PRN

## 2021-12-17 MED ORDER — ACETAMINOPHEN 325 MG PO TABS: 650.0000 mg | ORAL_TABLET | Freq: Four times a day (QID) | ORAL | Status: DC | PRN

## 2021-12-17 MED ORDER — POLYVINYL ALCOHOL 1.4 % OP SOLN
1.0000 [drp] | Freq: Four times a day (QID) | OPHTHALMIC | Status: DC | PRN
  Filled 2021-12-17: qty 15

## 2021-12-17 MED ORDER — DEXTROSE 50 % IV SOLN
INTRAVENOUS | Status: AC
  Filled 2021-12-17: qty 50

## 2021-12-17 MED ORDER — CALCIUM GLUCONATE-NACL 2-0.675 GM/100ML-% IV SOLN
2.0000 g | Freq: Once | INTRAVENOUS | Status: AC
  Administered 2021-12-17: 2000 mg via INTRAVENOUS
  Filled 2021-12-17: qty 100

## 2021-12-17 MED ORDER — MIDAZOLAM HCL 2 MG/2ML IJ SOLN: 2.0000 mg | INTRAMUSCULAR | Status: DC | PRN

## 2021-12-17 MED ORDER — DEXTROSE 50 % IV SOLN
12.5000 g | INTRAVENOUS | Status: AC
  Administered 2021-12-17: 12.5 g via INTRAVENOUS

## 2021-12-17 MED ORDER — ACETAMINOPHEN 650 MG RE SUPP: 650.0000 mg | Freq: Four times a day (QID) | RECTAL | Status: DC | PRN

## 2021-12-17 MED ORDER — ORAL CARE MOUTH RINSE: 15.0000 mL | OROMUCOSAL | Status: DC | PRN

## 2021-12-17 MED ORDER — HALOPERIDOL LACTATE 5 MG/ML IJ SOLN: 2.5000 mg | INTRAMUSCULAR | Status: DC | PRN

## 2021-12-17 MED ORDER — ORAL CARE MOUTH RINSE
15.0000 mL | OROMUCOSAL | Status: DC
Start: 2021-12-17 — End: 2021-12-17
  Administered 2021-12-17 (×4): 15 mL via OROMUCOSAL

## 2021-12-18 LAB — PATHOLOGIST SMEAR REVIEW: Path Review: NEGATIVE

## 2021-12-19 LAB — CULTURE, BLOOD (ROUTINE X 2)
Special Requests: ADEQUATE
Special Requests: ADEQUATE

## 2022-01-13 NOTE — Progress Notes (Signed)
All critical values reported to Elink overnight. Orders were placed by MD as needed per lab results.

## 2022-01-13 NOTE — Death Summary Note (Signed)
DEATH SUMMARY   Patient Details  Name: Stacey Burns MRN: [id number] DOB: 14-Oct-1955  Admission/Discharge Information   Admit Date:  2022/01/14  Date of Death: Date of Death: 01-16-22  Time of Death: Time of Death: 75  Length of Stay: 2  Referring Physician: Andria Frames, PA-C   Reason(s) for Hospitalization  {Trauma:[id number]::"***"}  Diagnoses  Preliminary cause of death:  Secondary Diagnoses (including complications and co-morbidities):  Principal Problem:   Diverticulitis Active Problems:   Shock [facility])   [facility] Hospital Course (including significant findings, care, treatment, and services provided and events leading to death)  Stacey Burns is a 66 year old female former smoker ( 32.3 pack year smoking history  quit 1991) with PMH of DM II, Asthma,  CAD, OSA on CPAP , History of COVID 19 infection, pacemaker ( 10/2021) , HTN, Hyperlipidemia Lupus, PAF, stroke, and  lung nodules on recent imaging  who presents to the ED after 3 days at home with abdominal pain, nausea, and vomiting . She does have a history or recent falls. She is currently taking Imuran for her Lupus and Xaralto for atrial fibrillation.  Work up in the ED was significant for Lactic acid initially 5.5 but increased to 6.2 ( After fluids), Na 137, K 5.8 , BUN of 51, Creatinine of 5.07 which increased to 5.60 after fluids, HGB 11.2, HCT 33, WBC 4.8, Platelets 95,000, Calcium 7.2, Total protein 4.8, Albumin 1.6, AST 962, ALT 396, Alk Phos 243, INR > 10, of Total Bili 3.8, GFR 9., Calcium corrects to 9.1 with albumin of 1.6 CT Chest Abdomen Pelvis was significant for diffuse intraabdominal edema, decreasing sensitivity for focal areas of inflammation, large hiatal hernia ( similar to previous imaging) , mild peripancreatic edema ?? pancreatitis , focal edema about the ascending colon could represent colitis or (given scattered diverticula) diverticulitis.Small volume uterine fibroids.  ***She developed  progressive septic shock requiring 4 pressors, bicarb gtt. She      Pertinent Labs and Studies  Significant Diagnostic Studies DG CHEST PORT 1 VIEW  Result Date: 2022/01/16 CLINICAL DATA:  Check endotracheal tube placement EXAM: PORTABLE CHEST 1 VIEW COMPARISON:  Film from the previous day. FINDINGS: Endotracheal tube has been withdrawn and now lies 4 cm above the carina. 10/8 dialysis catheter is again seen on the left and stable. Pacing device is noted. Right jugular central line is noted extending into the mid superior vena cava. Gastric catheter again extends into a large hiatal hernia. No focal infiltrate is seen. IMPRESSION: Tubes and lines as described above stable from the prior exam. Large hiatal hernia. Electronically Signed   By: Inez Catalina M.D.   On: 01-16-22 00:21   DG CHEST PORT 1 VIEW  Result Date: 12/16/2021 CLINICAL DATA:  Endotracheal tube placement EXAM: PORTABLE CHEST 1 VIEW COMPARISON:  12/16/2021 FINDINGS: Support Apparatus: --Endotracheal tube: Tip 0.5 cm above the inferior margin of the carina. --Enteric tube:Within the intrathoracic portion of large hiatal hernia. --Vascular catheter(s):Left IJ approach central venous catheter tip in the lower left brachiocephalic vein. Right IJ approach catheter tip in the mid SVC. --Other: None Left retrocardiac consolidation/atelectasis. IMPRESSION: Endotracheal tube tip 0.5 cm above the inferior margin of the carina. Recommend retracting by 2-3 cm. Electronically Signed   By: Ulyses Jarred M.D.   On: 12/16/2021 20:38   ECHOCARDIOGRAM COMPLETE  Result Date: 12/16/2021    ECHOCARDIOGRAM REPORT   Patient Name:   Stacey Burns Date of Exam: 12/16/2021 Medical Rec #:  [id number]  Height:       64.0 in Accession #:    [id number]           Weight:       257.3 lb Date of Birth:  Feb 11, 1956            BSA:          2.177 m Patient Age:    71 years             BP:           132/115 mmHg Patient Gender: F                    HR:            73 bpm. Exam Location:  Inpatient Procedure: 2D Echo, Cardiac Doppler and Color Doppler Indications:     shock  History:         Patient has prior history of Echocardiogram examinations, most                  recent 11/25/2021. Pacemaker and Abnormal ECG; Risk                  Factors:Diabetes, Dyslipidemia and Hypertension.  Sonographer:     Melissa Morford RDCS (AE, PE) Referring Phys:  [place] Diagnosing Phys: Vernell Leep MD IMPRESSIONS  1. Left ventricular ejection fraction, by estimation, is 55 to 60%. The left ventricle has normal function. The left ventricle has no regional wall motion abnormalities. Left ventricular diastolic parameters are consistent with Grade I diastolic dysfunction (impaired relaxation).  2. Right ventricular systolic function is normal. The right ventricular size is normal.  3. The mitral valve is normal in structure. No evidence of mitral valve regurgitation. No evidence of mitral stenosis.  4. The aortic valve is normal in structure. Aortic valve regurgitation is not visualized. No aortic stenosis is present. Comparison(s): Compared to outpatient study in 11/2021, mild TR, TR not well appreciated. No otehr significant change noted. FINDINGS  Left Ventricle: Left ventricular ejection fraction, by estimation, is 55 to 60%. The left ventricle has normal function. The left ventricle has no regional wall motion abnormalities. The left ventricular internal cavity size was normal in size. There is  no left ventricular hypertrophy. Left ventricular diastolic parameters are consistent with Grade I diastolic dysfunction (impaired relaxation). Normal left ventricular filling pressure. Right Ventricle: The right ventricular size is normal. No increase in right ventricular wall thickness. Right ventricular systolic function is normal. Left Atrium: Left atrial size was normal in size. Right Atrium: Right atrial size was normal in size. Pericardium: There is no evidence of  pericardial effusion. Mitral Valve: The mitral valve is normal in structure. No evidence of mitral valve regurgitation. No evidence of mitral valve stenosis. Tricuspid Valve: The tricuspid valve is normal in structure. Tricuspid valve regurgitation is not demonstrated. No evidence of tricuspid stenosis. Aortic Valve: The aortic valve is normal in structure. Aortic valve regurgitation is not visualized. No aortic stenosis is present. Aortic valve mean gradient measures 5.0 mmHg. Aortic valve peak gradient measures 13.7 mmHg. Aortic valve area, by VTI measures 2.64 cm. Pulmonic Valve: The pulmonic valve was normal in structure. Pulmonic valve regurgitation is not visualized. No evidence of pulmonic stenosis. Aorta: The aortic root is normal in size and structure. IAS/Shunts: No atrial level shunt detected by color flow Doppler. Additional Comments: A device lead is visualized.  LEFT VENTRICLE PLAX 2D LVIDd:  4.30 cm   Diastology LVIDs:         3.30 cm   LV e' medial:    5.77 cm/s LV PW:         1.00 cm   LV E/e' medial:  18.7 LV IVS:        0.90 cm   LV e' lateral:   6.09 cm/s LVOT diam:     1.90 cm   LV E/e' lateral: 17.7 LV SV:         68 LV SV Index:   31 LVOT Area:     2.84 cm  LEFT ATRIUM           Index LA diam:      2.30 cm 1.06 cm/m LA Vol (A4C): 19.7 ml 9.05 ml/m  AORTIC VALVE AV Area (Vmax):    2.10 cm AV Area (Vmean):   2.66 cm AV Area (VTI):     2.64 cm AV Vmax:           185.00 cm/s AV Vmean:          95.200 cm/s AV VTI:            0.257 m AV Peak Grad:      13.7 mmHg AV Mean Grad:      5.0 mmHg LVOT Vmax:         137.00 cm/s LVOT Vmean:        89.400 cm/s LVOT VTI:          0.239 m LVOT/AV VTI ratio: 0.93  AORTA Ao Root diam: 2.80 cm Ao Asc diam:  2.90 cm MITRAL VALVE MV Area (PHT): 3.45 cm     SHUNTS MV Decel Time: 220 msec     Systemic VTI:  0.24 m MV E velocity: 108.00 cm/s  Systemic Diam: 1.90 cm MV A velocity: 87.90 cm/s MV E/A ratio:  1.23 Manish Patwardhan MD Electronically signed  by Vernell Leep MD Signature Date/Time: 12/16/2021/5:34:09 PM    Final    DG CHEST PORT 1 VIEW  Result Date: 12/16/2021 CLINICAL DATA:  Evaluate central venous catheter placement EXAM: PORTABLE CHEST 1 VIEW COMPARISON:  Earlier today FINDINGS: There is a right IJ catheter with tip in the projection of the cavoatrial junction. Larger bore left IJ catheter has been placed with tip at the confluence of the SVC and innominate vein. No pneumothorax identified. Very large hiatal hernia is again noted. Enteric 2 appears within the intrathoracic stomach. Left chest wall pacer device noted with leads in the right atrial appendage and right ventricle. Similar appearance of small bilateral pleural effusions and pulmonary vascular congestion. Patchy densities within the left midlung and left base appear increased from the previous exam and may represent areas of atelectasis or developing airspace disease. IMPRESSION: 1. Interval placement of left IJ catheter with tip at the confluence of the SVC and innominate vein. 2. Similar appearance of small pleural effusions with pulmonary vascular congestion. 3. Patchy opacities identified within the left midlung and left base may represent atelectasis or developing airspace disease. Electronically Signed   By: Kerby Moors M.D.   On: 12/16/2021 12:31   DG CHEST PORT 1 VIEW  Result Date: 12/16/2021 CLINICAL DATA:  Check central line placement EXAM: PORTABLE CHEST 1 VIEW COMPARISON:  Film from the previous day. FINDINGS: Cardiac shadow is enlarged but stable. Aortic calcifications are seen. Pacing device is again noted. Gastric catheter has been advanced into the stomach although a large hiatal hernia is noted. Right jugular  central line is noted extending into the superior right atrium. No pneumothorax is seen. Mild vascular congestion is noted. IMPRESSION: No pneumothorax following central line placement. Large hiatal hernia with gastric catheter within. Mild vascular  congestion. Electronically Signed   By: Inez Catalina M.D.   On: 12/16/2021 03:53   DG Abd 1 View  Result Date: 12/16/2021 CLINICAL DATA:  NG tube placement. EXAM: ABDOMEN - 1 VIEW COMPARISON:  01/08/2022. FINDINGS: Examination is limited due to single view obtained. The bowel gas pattern is normal. An enteric tube terminates at the right lung base in the region of a known large hiatal hernia. No renal calculi. Surgical clips are noted in the right upper quadrant. A pacemaker device is present over the left chest. Mild strandy atelectasis is noted at the lung bases. IMPRESSION: Enteric tube terminates at the right lung base in the region of known large hiatal hernia. Electronically Signed   By: Brett Fairy M.D.   On: 12/16/2021 01:52   CT Angio Abd/Pel w/ and/or w/o  Result Date: 12/27/2021 CLINICAL DATA:  Mesenteric ischemia, acute. Evaluate ischemic colitis. EXAM: CTA ABDOMEN AND PELVIS WITHOUT AND WITH CONTRAST TECHNIQUE: Multidetector CT imaging of the abdomen and pelvis was performed using the standard protocol during bolus administration of intravenous contrast. Multiplanar reconstructed images and MIPs were obtained and reviewed to evaluate the vascular anatomy. RADIATION DOSE REDUCTION: This exam was performed according to the departmental dose-optimization program which includes automated exposure control, adjustment of the mA and/or kV according to patient size and/or use of iterative reconstruction technique. CONTRAST:  159m OMNIPAQUE IOHEXOL 350 MG/ML SOLN COMPARISON:  Noncontrast CT earlier today. FINDINGS: VASCULAR Aorta: Normal caliber aorta without aneurysm, dissection, vasculitis or significant stenosis. Mild to moderate atherosclerosis. Celiac: Patent without evidence of aneurysm, dissection, vasculitis or significant stenosis. The splenic artery may arise directly from the upper abdominal aorta and is diminutive. SMA: Patent without evidence of aneurysm, dissection, vasculitis or  significant stenosis. Renals: Small in caliber. No severe stenosis. Early branching of the right renal artery. IMA: Poorly opacified, unclear if this is due to small size. There is no evidence of embolic disease. Inflow: Patent without evidence of aneurysm, dissection, vasculitis or significant stenosis. Proximal Outflow: Bilateral common femoral and visualized portions of the superficial and profunda femoral arteries are patent without evidence of aneurysm, dissection, vasculitis or significant stenosis. Vessels are small in caliber. Veins: No acute findings on venous phase imaging. No portal venous gas. Review of the MIP images confirms the above findings. NON-VASCULAR Lower chest: Again seen large hiatal hernia. The entire chest was evaluated on CT earlier today. No significant change from that exam. Hepatobiliary: No focal hepatic abnormality cholecystectomy without biliary dilatation. Pancreas: Faint peripancreatic edema is again seen. No ductal dilatation or evidence of focal pancreatic abnormality Spleen: Normal in size without focal abnormality. Adrenals/Urinary Tract: No adrenal nodule. No hydronephrosis. No focal renal abnormality. Urinary bladder is empty. Stomach/Bowel: The majority of the stomach is intrathoracic. Dependent density in the stomach is felt to be related to ingested material rather than active GI bleed, and is unchanged on arterial and venous phase. The entire stomach is not included in the field of view, but was seen on chest CT earlier today. Ingested material within the descending colon does not represent active GI hemorrhage in was seen on noncontrast exam earlier today, series 5, image 71. There is possible wall thickening of the cecum and proximal ascending colon, series 5 images 56 and 66, similar pericolonic edema to  earlier today in this region. There is no definite colonic inflammation at the splenic flexure in a location typical for ischemic colitis. Scattered colonic  diverticula, without focal diverticulitis. Normal appendix. No small bowel inflammation. No bowel pneumatosis. Lymphatic: No abdominopelvic adenopathy. Reproductive: Small calcified uterine fibroids. Quiescent ovaries, no adnexal mass. Other: Mild fat stranding about the mesentery and retroperitoneum. No ascites or free air. No abdominal wall hernia. Musculoskeletal: Stable lumbar degenerative change. No acute findings. IMPRESSION: 1. Aortic atherosclerosis. Small caliber aortic branches including the mesenteric arteries. The IMA is poorly opacified, likely due to small caliber rather than occlusion. No evidence of embolic disease. There are no findings in the IMA vascular distribution of the colon to suggest ischemic colitis. 2. Questionable wall thickening of the cecum and proximal ascending colon in the area of previous pericolonic edema. Favor mild colitis. Location is typical of infectious or inflammatory process. There is no definite colonic inflammation at the splenic flexure in a location typical for ischemic colitis. 3. Colonic diverticulosis. 4. Large hiatal hernia with the entire stomach being intrathoracic. The entire stomach is not included in the field of view, but was included on chest CT earlier today. 5. Faint peripancreatic edema is again seen, nonspecific but can be seen in the setting of acute pancreatitis. Recommend correlation with pancreatic enzymes. Aortic Atherosclerosis (ICD10-I70.0). Electronically Signed   By: Keith Rake M.D.   On: 12/14/2021 20:30   US Abdomen Limited RUQ (LIVER/GB)  Result Date: 12/13/2021 CLINICAL DATA:  Elevated total bilirubin levels. EXAM: ULTRASOUND ABDOMEN LIMITED RIGHT UPPER QUADRANT COMPARISON:  CT from earlier today FINDINGS: Gallbladder: Status post cholecystectomy. Common bile duct: Diameter: 3.4 mm.  No signs of intrahepatic bile duct dilatation. Liver: No focal lesion identified. Within normal limits in parenchymal echogenicity. Portal vein is  patent on color Doppler imaging with normal direction of blood flow towards the liver. Other: Exam detail diminished secondary to body habitus. IMPRESSION: 1. No acute findings.  No bile duct dilatation identified. 2. Status post cholecystectomy. Electronically Signed   By: Kerby Moors M.D.   On: 01/04/2022 14:45   CT CHEST ABDOMEN PELVIS WO CONTRAST  Result Date: 12/18/2021 CLINICAL DATA:  Abnormal chest radiograph. Patient's symptoms including abdominal pain and possible sepsis. EXAM: CT CHEST, ABDOMEN AND PELVIS WITHOUT CONTRAST TECHNIQUE: Multidetector CT imaging of the chest, abdomen and pelvis was performed following the standard protocol without IV contrast. RADIATION DOSE REDUCTION: This exam was performed according to the departmental dose-optimization program which includes automated exposure control, adjustment of the mA and/or kV according to patient size and/or use of iterative reconstruction technique. COMPARISON:  Chest radiograph of 01/10/2022. Chest abdomen and pelvic CTs of 01/15/2019. FINDINGS: CT CHEST FINDINGS Cardiovascular: Right atrial and ventricular pacer. Aortic atherosclerosis. Mild cardiomegaly, without pericardial effusion. Lad coronary artery calcification. Mediastinum/Nodes: No mediastinal or definite hilar adenopathy, given limitations of unenhanced CT. Large hiatal hernia, with nearly the entire stomach positioned in the low right chest. Similar in size to on the prior. No complicating obstruction. Increased density within the herniated fat is mild, including on 40/3. Lungs/Pleura: Trace right pleural fluid is similar. Right lower lobe dependent atelectasis and volume loss, slightly increased since 2020. No lobar consolidation. Musculoskeletal: No acute osseous abnormality. CT ABDOMEN PELVIS FINDINGS Hepatobiliary: Normal noncontrast appearance of the liver. Cholecystectomy, without biliary ductal dilatation. Pancreas: No pancreatic duct dilatation. Subtle edema within the  peripancreatic fat including on 54/3. Spleen: Normal in size, without focal abnormality. Adrenals/Urinary Tract: Normal adrenal glands. No renal calculi or  hydronephrosis. No hydroureter or ureteric calculi. No bladder calculi. Stomach/Bowel: Primarily herniated into the lower chest, as detailed above. Scattered colonic diverticula. Suspect somewhat more focal edema adjacent the ascending colon including on 72/3. Normal terminal ileum and appendix.  Normal small bowel. Vascular/Lymphatic: Aortic atherosclerosis. No abdominopelvic adenopathy. Reproductive: Calcified uterine fibroids. Other: No significant free fluid.  No free intraperitoneal air. Musculoskeletal: Osteopenia.  Degenerate disc disease at L4-5. IMPRESSION: 1. Decreased sensitivity exam secondary to lack of oral or IV contrast. 2. Relatively diffuse intraabdominal edema, decreasing sensitivity for focal areas of inflammation. 3. Large hiatal hernia, with the majority of the stomach positioned in the lower chest. Subtle edema within the herniated fat is nonspecific, especially given above diffuse edema. Correlate with symptoms of inflammation or even ischemia of the herniated stomach. 4. Similarly, mild peripancreatic edema for which pancreatitis cannot be excluded. 5. More focal edema about the ascending colon could represent colitis or (given scattered diverticula) diverticulitis. 6. Small volume uterine fibroids. 7. Coronary artery atherosclerosis. Aortic Atherosclerosis (ICD10-I70.0). Electronically Signed   By: Abigail Miyamoto M.D.   On: 12/26/2021 12:04   DG Chest Port 1 View  Result Date: 12/20/2021 CLINICAL DATA:  Abdominal pain, possible sepsis EXAM: PORTABLE CHEST 1 VIEW COMPARISON:  10/14/2021 FINDINGS: Dual lead pacer noted. Increased appreciable prominence of abnormal right retrocardiac and pericardiac density, most likely attributable to the large hiatal hernia shown on 01/15/2019. Atherosclerotic calcification of the aortic arch. Underlying  mild to moderate enlargement of the cardiopericardial silhouette. The lungs appear otherwise clear. IMPRESSION: 1. Increased prominence of right retrocardiac density. While most likely attributable to the patient's large right eccentric hiatal hernia, I cannot exclude superimposed pneumonia or underlying lesion medially in the right lung base. This could be further characterized with chest CT if clinically warranted. 2.  Aortic Atherosclerosis (ICD10-I70.0). 3. At least mild enlargement of the cardiopericardial silhouette, without pulmonary edema. 4. Dual lead pacer. Electronically Signed   By: Van Clines M.D.   On: 12/13/2021 09:34   PCV MYOCARDIAL PERFUSION WITH LEXISCAN  Result Date: 11/30/2021 Lexiscan Nuclear stress test 11/26/2021: Nondiagnostic ECG stress. AP VS rhythm, The heart rate response was consistent with Regadenoson. Myocardial perfusion is normal with mild soft tissue attenuation in the inferior wall. Overall LV systolic function is normal without regional wall motion abnormalities. Stress LV EF: 60%. No previous exam available for comparison. Low risk.    PCV ECHOCARDIOGRAM COMPLETE  Result Date: 11/25/2021 Echocardiogram 11/24/2021: Left ventricle cavity is normal in size and wall thickness. Normal global wall motion. Normal LV systolic function with EF 60%. Doppler evidence of grade I (impaired) diastolic dysfunction, normal LAP. Mild (Grade I) mitral regurgitation. Mild tricuspid regurgitation. No evidence of pulmonary hypertension.   Microbiology Recent Results (from the past 240 hour(s))  Resp Panel by RT-PCR (Flu A&B, Covid) Anterior Nasal Swab     Status: None   Collection Time: 01/11/2022  9:14 AM   Specimen: Anterior Nasal Swab  Result Value Ref Range Status   SARS Coronavirus 2 by RT PCR NEGATIVE NEGATIVE Final    Comment: (NOTE) SARS-CoV-2 target nucleic acids are NOT DETECTED.  The SARS-CoV-2 RNA is generally detectable in upper respiratory specimens during the  acute phase of infection. The lowest concentration of SARS-CoV-2 viral copies this assay can detect is 138 copies/mL. A negative result does not preclude SARS-Cov-2 infection and should not be used as the sole basis for treatment or other patient management decisions. A negative result may occur with  improper specimen collection/handling,  submission of specimen other than nasopharyngeal swab, presence of viral mutation(s) within the areas targeted by this assay, and inadequate number of viral copies(<138 copies/mL). A negative result must be combined with clinical observations, patient history, and epidemiological information. The expected result is Negative.  Fact Sheet for Patients:  EntrepreneurPulse.com.au  Fact Sheet for Healthcare Providers:  IncredibleEmployment.be  This test is no t yet approved or cleared by the [place] FDA and  has been authorized for detection and/or diagnosis of SARS-CoV-2 by FDA under an Emergency Use Authorization (EUA). This EUA will remain  in effect (meaning this test can be used) for the duration of the COVID-19 declaration under Section 564(b)(1) of the Act, 21 U.S.C.section 360bbb-3(b)(1), unless the authorization is terminated  or revoked sooner.       Influenza A by PCR NEGATIVE NEGATIVE Final   Influenza B by PCR NEGATIVE NEGATIVE Final    Comment: (NOTE) The Xpert Xpress SARS-CoV-2/FLU/RSV plus assay is intended as an aid in the diagnosis of influenza from Nasopharyngeal swab specimens and should not be used as a sole basis for treatment. Nasal washings and aspirates are unacceptable for Xpert Xpress SARS-CoV-2/FLU/RSV testing.  Fact Sheet for Patients: EntrepreneurPulse.com.au  Fact Sheet for Healthcare Providers: IncredibleEmployment.be  This test is not yet approved or cleared by the [place] FDA and has been authorized for detection and/or  diagnosis of SARS-CoV-2 by FDA under an Emergency Use Authorization (EUA). This EUA will remain in effect (meaning this test can be used) for the duration of the COVID-19 declaration under Section 564(b)(1) of the Act, 21 U.S.C. section 360bbb-3(b)(1), unless the authorization is terminated or revoked.  Performed at [facility], [place] [address] [place], [place] [address]   Blood Culture (routine x 2)     Status: None (Preliminary result)   Collection Time: 01/11/2022 10:05 AM   Specimen: BLOOD  Result Value Ref Range Status   Specimen Description BLOOD RIGHT ANTECUBITAL  Final   Special Requests   Final    BOTTLES DRAWN AEROBIC AND ANAEROBIC Blood Culture adequate volume   Culture  Setup Time   Final    GRAM POSITIVE COCCI IN CLUSTERS GRAM POSITIVE RODS ANAEROBIC BOTTLE ONLY CRITICAL RESULT CALLED TO, READ BACK BY AND VERIFIED WITH: PHARMD K HAMMONS [id number] AT 458 BY EC    Culture   Final    GRAM POSITIVE RODS GRAM POSITIVE COCCI CULTURE REINCUBATED FOR BETTER GROWTH ISOLATING Performed at [facility], [place] [address], [place], [place] [address]    Report Status PENDING  Incomplete  Blood Culture (routine x 2)     Status: Abnormal (Preliminary result)   Collection Time: 01/04/2022 10:08 AM   Specimen: BLOOD  Result Value Ref Range Status   Specimen Description BLOOD FEMORAL ARTERY  Final   Special Requests   Final    BOTTLES DRAWN AEROBIC AND ANAEROBIC Blood Culture adequate volume   Culture  Setup Time   Final    GRAM POSITIVE COCCI AEROBIC BOTTLE ONLY IN BOTH AEROBIC AND ANAEROBIC BOTTLES CRITICAL RESULT CALLED TO, READ BACK BY AND VERIFIED WITH: PHARMD GREG ABBOTT 12/16/21'@5' :12 BY TW    Culture (A)  Final    STAPHYLOCOCCUS CAPITIS THE SIGNIFICANCE OF ISOLATING THIS ORGANISM FROM A SINGLE SET OF BLOOD CULTURES WHEN MULTIPLE SETS ARE DRAWN IS UNCERTAIN. PLEASE NOTIFY THE MICROBIOLOGY DEPARTMENT WITHIN ONE WEEK IF SPECIATION AND SENSITIVITIES ARE  REQUIRED. Performed at [facility], [place] [address], [place], [place] [address]  Report Status PENDING  Incomplete  Blood Culture ID Panel (Reflexed)     Status: Abnormal   Collection Time: 01/02/2022 10:08 AM  Result Value Ref Range Status   Enterococcus faecalis NOT DETECTED NOT DETECTED Final   Enterococcus Faecium NOT DETECTED NOT DETECTED Final   Listeria monocytogenes NOT DETECTED NOT DETECTED Final   Staphylococcus species DETECTED (A) NOT DETECTED Final    Comment: CRITICAL RESULT CALLED TO, READ BACK BY AND VERIFIED WITH: PHARMD GREG ABBOTT 12/16/21'@5' :12 BY TW    Staphylococcus aureus (BCID) NOT DETECTED NOT DETECTED Final   Staphylococcus epidermidis NOT DETECTED NOT DETECTED Final   Staphylococcus lugdunensis NOT DETECTED NOT DETECTED Final   Streptococcus species NOT DETECTED NOT DETECTED Final   Streptococcus agalactiae NOT DETECTED NOT DETECTED Final   Streptococcus pneumoniae NOT DETECTED NOT DETECTED Final   Streptococcus pyogenes NOT DETECTED NOT DETECTED Final   A.calcoaceticus-baumannii NOT DETECTED NOT DETECTED Final   Bacteroides fragilis NOT DETECTED NOT DETECTED Final   Enterobacterales NOT DETECTED NOT DETECTED Final   Enterobacter cloacae complex NOT DETECTED NOT DETECTED Final   Escherichia coli NOT DETECTED NOT DETECTED Final   Klebsiella aerogenes NOT DETECTED NOT DETECTED Final   Klebsiella oxytoca NOT DETECTED NOT DETECTED Final   Klebsiella pneumoniae NOT DETECTED NOT DETECTED Final   Proteus species NOT DETECTED NOT DETECTED Final   Salmonella species NOT DETECTED NOT DETECTED Final   Serratia marcescens NOT DETECTED NOT DETECTED Final   Haemophilus influenzae NOT DETECTED NOT DETECTED Final   Neisseria meningitidis NOT DETECTED NOT DETECTED Final   Pseudomonas aeruginosa NOT DETECTED NOT DETECTED Final   Stenotrophomonas maltophilia NOT DETECTED NOT DETECTED Final   Candida albicans NOT DETECTED NOT DETECTED Final   Candida auris NOT DETECTED  NOT DETECTED Final   Candida glabrata NOT DETECTED NOT DETECTED Final   Candida krusei NOT DETECTED NOT DETECTED Final   Candida parapsilosis NOT DETECTED NOT DETECTED Final   Candida tropicalis NOT DETECTED NOT DETECTED Final   Cryptococcus neoformans/gattii NOT DETECTED NOT DETECTED Final    Comment: Performed at [facility], [address] [place], [place] [address]  MRSA Next Gen by PCR, Nasal     Status: None   Collection Time: 12/24/2021  4:31 PM   Specimen: Nasal Swab  Result Value Ref Range Status   MRSA by PCR Next Gen NOT DETECTED NOT DETECTED Final    Comment: (NOTE) The GeneXpert MRSA Assay (FDA approved for NASAL specimens only), is one component of a comprehensive MRSA colonization surveillance program. It is not intended to diagnose MRSA infection nor to guide or monitor treatment for MRSA infections. Test performance is not FDA approved in patients less than 45 years old. Performed at [facility], [place] [address], [place], [place] [address]   Urine Culture     Status: Abnormal   Collection Time: 01/07/2022  7:00 PM   Specimen: In/Out Cath Urine  Result Value Ref Range Status   Specimen Description IN/OUT CATH URINE  Final   Special Requests   Final    NONE Performed at [facility], [place] [address], [place], [place] [address]    Culture MULTIPLE SPECIES PRESENT, SUGGEST RECOLLECTION (A)  Final   Report Status 12/16/2021 FINAL  Final    Lab Basic Metabolic Panel: Recent Labs  Lab 01/09/2022 1551 01/07/2022 2346 12/16/21 0008 12/16/21 0343 12/16/21 0744 12/16/21 1505 12/16/21 1920 12/16/21 2212 12/16/21 2354 31-Dec-2021 0252 31-Dec-2021 0403  NA 138 136   < >  138   < > 141 136 139 135 134* 135  K 5.8* 5.7*   < > 5.4*   < > 6.4* 5.0 4.5 4.2 4.8 4.7  CL 114* 113*  --  109  --  102  --  94*  --   --  91*  CO2 11* 10*  --  10*  --  11*  --  14*  --   --  17*  GLUCOSE 89 71  --  136*  --  142*  --  150*  --   --  62*  BUN 39* 43*  --  45*  --  40*   --  31*  --   --  24*  CREATININE 4.91* 5.20*  --  5.48*  --  4.89*  --  3.89*  --   --  3.04*  CALCIUM 6.9* 6.5*  --  6.8*  --  6.8*  --  5.7*  --   --  5.6*  MG 2.1  --   --  2.0  --   --   --  2.2  --   --  2.0  PHOS  --   --   --   --   --  8.9*  --   --   --   --  6.8*   < > = values in this interval not displayed.   Liver Function Tests: Recent Labs  Lab 01/11/2022 1008 12/16/21 0343 12/16/21 1505 2022/01/01 0403  AST 962* 1,015*  --   --   ALT 396* 398*  --   --   ALKPHOS 243* 226*  --   --   BILITOT 3.8* 4.8*  --   --   PROT 4.8* 4.9*  --   --   ALBUMIN 1.6* 1.8* 2.0* 1.6*   Recent Labs  Lab 12/16/2021 1008 01/05/2022 1551 12/16/21 0343  LIPASE 73* 75* 87*   No results for input(s): "AMMONIA" in the last 168 hours. CBC: Recent Labs  Lab 01/05/2022 1008 12/26/2021 1024 12/16/21 0343 12/16/21 0744 12/16/21 0905 12/16/21 1406 12/16/21 1505 12/16/21 1920 12/16/21 2212 12/16/21 2354 2022-01-01 0252 01/01/2022 0403  WBC 4.8  --  5.4  --   --   --  5.1  --  3.8*  --   --   --   NEUTROABS 3.6  --   --   --   --   --   --   --   --   --   --   --   HGB 10.5*   < > 10.4*   < >  --    < > 9.0* 8.5* 8.9* 9.2* 8.8*  --   HCT 31.6*   < > 29.9*   < >  --    < > 27.6* 25.0* 26.4* 27.0* 26.0*  --   MCV 106.4*  --  105.7*  --   --   --  110.4*  --  107.3*  --   --   --   PLT 95*  --  93*  --  100*  --  86*  85*  --  66*  69*  --   --  54*   < > = values in this interval not displayed.   Cardiac Enzymes: No results for input(s): "CKTOTAL", "CKMB", "CKMBINDEX", "TROPONINI" in the last 168 hours. Sepsis Labs: Recent Labs  Lab 12/18/2021 1008 01/05/2022 1203 12/23/2021 1551 12/18/2021 2346 12/16/21 0343 12/16/21 0512 12/16/21 1411 12/16/21 1505 12/16/21  1510 12/16/21 2212  PROCALCITON  --   --  1.83  --   --   --   --   --   --   --   WBC 4.8  --   --   --  5.4  --   --  5.1  --  3.8*  LATICACIDVEN 5.5*   < > 8.0*   < >  --  8.9* >9.0*  --  >9.0* >9.0*   < > = values in this  interval not displayed.    Procedures/Operations  ***   Julian Hy 01-12-2022, 6:23 PM

## 2022-01-13 NOTE — Progress Notes (Signed)
Patient evaluated at bedside  Suboptimal blood pressures despite maximal doses of pressors On stress dose steroids On CRRT-not pulling any fluid off  I did discuss with Dr. Valora Piccolo, discussed earlier with Dr. Quay Burow daughter to update her about patient's current situation and prognosis With maximal doses of pressors, resuscitative efforts will not be successful  She does not want to change the CODE STATUS until she speaks with her brother and they are both physically making their way to the hospital   Prognosis is grim Without optimal blood pressures, it is unlikely she will be able to maintain adequate perfusion of organs, resuscitative efforts will not not yield any positive outcomes.  Will continue aggressive resuscitative efforts.  Remains a full code for now

## 2022-01-13 NOTE — Progress Notes (Signed)
NAME:  Stacey Burns, MRN:  [id number], DOB:  October 04, 1955, LOS: 2 ADMISSION DATE:  12/22/2021, CONSULTATION DATE:  12/14/2021 REFERRING MD:  Roderic Palau, CHIEF COMPLAINT:  Diverticulitis vs colitis with dehydration    History of Present Illness:  66 year old female former smoker ( 32.3 pack year smoking history  quit 1991) with PMH of DM II, Asthma,  CAD, OSA on CPAP , History of COVID 19 infection, pacemaker ( 10/2021) , HTN, Hyperlipidemia Lupus, PAF, stroke, and  lung nodules on recent imaging  who presents to the ED after 3 days at home with abdominal pain, nausea, and vomiting . She does have a history or recent falls. She is currently taking Imuran for her Lupus and Xaralto for atrial fibrillation.  Work up in the ED was significant for Lactic acid initially 5.5 but increased to 6.2 ( After fluids), Na 137, K 5.8 , BUN of 51, Creatinine of 5.07 which increased to 5.60 after fluids, HGB 11.2, HCT 33, WBC 4.8, Platelets 95,000, Calcium 7.2, Total protein 4.8, Albumin 1.6, AST 962, ALT 396, Alk Phos 243, INR > 10, of Total Bili 3.8, GFR 9., Calcium corrects to 9.1 with albumin of 1.6 CT Chest Abdomen Pelvis was significant for diffuse intraabdominal edema, decreasing sensitivity for focal areas of inflammation, large hiatal hernia ( similar to previous imaging) , mild peripancreatic edema ?? pancreatitis , focal edema about the ascending colon could represent colitis or (given scattered diverticula) diverticulitis.Small volume uterine fibroids.  PCCM were asked to admit patient and manage care.  Pertinent  Medical History   Past Medical History:  Diagnosis Date   Asthma    Coronary artery disease    CPAP (continuous positive airway pressure) dependence    Diabetes mellitus without complication ([facility])    Encounter for care of pacemaker 11/30/2021   Hypertension    Lupus ([facility])    Pacemaker: Biotronik Edora Dual chamber Pacemaker 10/13/2021 11/30/2021   Paroxysmal atrial fibrillation ([facility])     Sinus node dysfunction ([facility])    Stroke ([place])      Significant Hospital Events: Including procedures, antibiotic start and stop dates in addition to other pertinent events   12/18/2021 >> Admission, CVC placed.  7/4 started on CRRT, intubated for encephalopathy, had progressive decline with septic shock  Interim History / Subjective:  Maxed on pressors and still hypotensive overnight. Family called in overnight.  Objective   Blood pressure (!) 66/48, pulse 76, temperature (!) 89.8 F (32.1 C), resp. rate (!) 35, height '5\' 4"'  (3.235 m), weight 116.7 kg, SpO2 100 %. CVP:  [8 mmHg-80 mmHg] 58 mmHg  Vent Mode: PRVC FiO2 (%):  [50 %-100 %] 100 % Set Rate:  [35 bmp] 35 bmp Vt Set:  [430 mL] 430 mL PEEP:  [10 cmH20] 10 cmH20 Plateau Pressure:  [23 cmH20-28 cmH20] 28 cmH20   Intake/Output Summary (Last 24 hours) at 26-Dec-2021 0846 Last data filed at 2021/12/26 0800 Gross per 24 hour  Intake 8372.04 ml  Output 366 ml  Net 8006.04 ml    Filed Weights   12/19/2021 0904 12/16/21 0500  Weight: 113.4 kg 116.7 kg    Examination: General: Chronically ill-appearing woman lying in bed intubated, sedated on fentanyl HENT: [place]/AT, endotracheal tube in place, OG tube Lungs: Tachypneic, synchronous with vent.  CTA B Cardiovascular: S1-S2, intermittently paced rhythm Abdomen: Obese, soft, tender to palpation with grimacing Extremities: Edema, no cyanosis Neuro: RASS -4, grimacing on abdominal exam, otherwise unresponsive. Derm: Warm, dry, no diffuse rashes  K+ 4.7 Bicarb 17 BUN 24 Cr 3.04 Calcium 0.7 Phosphorus 6.8 WBC 5.4 H/H 10.4/29.9 Platelets 93 INR 10.4 Fibrinogen 260 Schistocytes present on peripheral smear Urine culture> multiple cc Blood cultures> GPC 2/4, GPR 1/4 Echocardiogram-LVEF 55 to 75%, grade 1 diastolic dysfunction, normal RV.  No significant valvular disease.  CXR personally reviewed-low lung volumes, hiatal hernia.  Pulmonary edema.  Central line, dialysis catheter,  endotracheal tube in place.  Resolved Hospital Problem list     Assessment & Plan:  Septic shock due to intraabdominal source of sepsis- exact cause unknown. Concern for ischemic colitis not supported by CT scan.  CT continues to demonstrate nonspecific areas of colon inflammation, mild pancreatic inflammation.  Severe hiatal hernia could lead to strangulation of the stomach, but CT scan has not suggestive of this. Blood cultures positive, 2/4 bottles for GPC.  Awaiting speciation.  1/4 GPR-unknown if these are contaminants. Immunocompromised due to SLE, on chronic on Imuran - Continue broad-spectrum antibiotics-Vanco, Flagyl, cefepime - Continue to follow cultures - Not stable enough for additional imaging or intervention.  So far no intervenable source has been identified for source control --Continue stress dose steroids - Continue vasopressors to maintain MAP greater than 65-we have not been successful doing this recently - Continue bicarbonate - Continue aggressive supportive care - Appreciate surgery's ongoing involvement  Severe metabolic acidosis, lactic acidosis Hyperkalemia AKI with oliguria-due to sepsis, at risk from receiving CT contrast on 7/3 - Appreciate nephrology's assistance with CRRT - Continue bicarb drip - Strict I's/O - Renally dose meds and avoid nephrotoxic meds - Continue Foley catheter  Hyperphosphatemia due to renal failure Hypocalcemia, likely due to bicarb and DIC - Additional repletion ordered - Continue to monitor  Elevated transaminases, hyperbilirubinemia due to sepsis - Monitor  Coagulopathy due to DIC Thrombocytopenia due to sepsis Anemia- Serial DIC panels - Transfuse for hemoglobin less than 7 hemodynamically significant bleeding for platelets less than 10 -FFP for severely elevated INR -Continue to hold Xarelto  Hx. Hypertension - Continue holding PTA antihypertensives once septic shock  DM II with controlled hyperglycemia; A1c 6.6  in 09/2021 Now Hypoglycemic - Hold additional insulin  Acute respiratory failure with hypoxia requiring mechanical ventilation, acute pulmonary edema due to volume overload H/o COPD -Continue Brovana, Yupelri, Pulmicort -CRRT to help with volume control  SLE, chronically immunocompromised - Continue to hold all immunosuppression - Continue stress dose steroids  Overall prognosis is very guarded with continued decline over the last 48 hours.  Family has been made aware throughout the course.  Transition to comfort care today.  Best Practice (right click and "Reselect all SmartList Selections" daily)   Diet/type: NPO DVT prophylaxis: SCD GI prophylaxis: PPI Lines: Central line and Arterial Line Foley:  Yes, and it is still needed Code Status:  full code Last date of multidisciplinary goals of care discussion [ brother and son updated 7.4]  Labs   CBC: Recent Labs  Lab 12/14/2021 1008 01/05/2022 1024 12/16/21 0343 12/16/21 0744 12/16/21 0905 12/16/21 1406 12/16/21 1505 12/16/21 1920 12/16/21 2212 12/16/21 2354 2021/12/24 0252 12-24-2021 0403  WBC 4.8  --  5.4  --   --   --  5.1  --  3.8*  --   --   --   NEUTROABS 3.6  --   --   --   --   --   --   --   --   --   --   --   HGB 10.5*   < >  10.4*   < >  --    < > 9.0* 8.5* 8.9* 9.2* 8.8*  --   HCT 31.6*   < > 29.9*   < >  --    < > 27.6* 25.0* 26.4* 27.0* 26.0*  --   MCV 106.4*  --  105.7*  --   --   --  110.4*  --  107.3*  --   --   --   PLT 95*  --  93*  --  100*  --  86*  85*  --  66*  69*  --   --  54*   < > = values in this interval not displayed.     Basic Metabolic Panel: Recent Labs  Lab 12/14/2021 1551 12/16/2021 2346 12/16/21 0008 12/16/21 0343 12/16/21 0744 12/16/21 1505 12/16/21 1920 12/16/21 2212 12/16/21 2354 09-Jan-2022 0252 Jan 09, 2022 0403  NA 138 136   < > 138   < > 141 136 139 135 134* 135  K 5.8* 5.7*   < > 5.4*   < > 6.4* 5.0 4.5 4.2 4.8 4.7  CL 114* 113*  --  109  --  102  --  94*  --   --  91*  CO2  11* 10*  --  10*  --  11*  --  14*  --   --  17*  GLUCOSE 89 71  --  136*  --  142*  --  150*  --   --  62*  BUN 39* 43*  --  45*  --  40*  --  31*  --   --  24*  CREATININE 4.91* 5.20*  --  5.48*  --  4.89*  --  3.89*  --   --  3.04*  CALCIUM 6.9* 6.5*  --  6.8*  --  6.8*  --  5.7*  --   --  5.6*  MG 2.1  --   --  2.0  --   --   --  2.2  --   --  2.0  PHOS  --   --   --   --   --  8.9*  --   --   --   --  6.8*   < > = values in this interval not displayed.    GFR: Estimated Creatinine Clearance: 22.8 mL/min (A) (by C-G formula based on SCr of 3.04 mg/dL (H)). Recent Labs  Lab 12/29/2021 1008 12/22/2021 1203 12/16/2021 1551 01/06/2022 2346 12/16/21 0343 12/16/21 0512 12/16/21 1411 12/16/21 1505 12/16/21 1510 12/16/21 2212  PROCALCITON  --   --  1.83  --   --   --   --   --   --   --   WBC 4.8  --   --   --  5.4  --   --  5.1  --  3.8*  LATICACIDVEN 5.5*   < > 8.0*   < >  --  8.9* >9.0*  --  >9.0* >9.0*   < > = values in this interval not displayed.     Liver Function Tests: Recent Labs  Lab 12/19/2021 1008 12/16/21 0343 12/16/21 1505 Jan 09, 2022 0403  AST 962* 1,015*  --   --   ALT 396* 398*  --   --   ALKPHOS 243* 226*  --   --   BILITOT 3.8* 4.8*  --   --   PROT 4.8* 4.9*  --   --   ALBUMIN 1.6*  1.8* 2.0* 1.6*    Recent Labs  Lab 12/30/2021 1008 12/24/2021 1551 12/16/21 0343  LIPASE 73* 75* 87*    No results for input(s): "AMMONIA" in the last 168 hours.  ABG    Component Value Date/Time   PHART 7.446 01-03-22 0252   PCO2ART 25.5 (L) 01-03-22 0252   PO2ART 49 (L) 2022/01/03 0252   HCO3 18.4 (L) January 03, 2022 0252   TCO2 19 (L) 01/03/2022 0252   ACIDBASEDEF 6.0 (H) 01/03/22 0252   O2SAT 93 01-03-22 0252    This patient is critically ill with multiple organ system failure which requires frequent high complexity decision making, assessment, support, evaluation, and titration of therapies. This was completed through the application of advanced monitoring  technologies and extensive interpretation of multiple databases. During this encounter critical care time was devoted to patient care services described in this note for 55 minutes.   Julian Hy, DO 01/03/2022 10:35 AM [facility]

## 2022-01-13 NOTE — Progress Notes (Signed)
eLink Physician-Brief Progress Note Patient Name: Ross Hefferan DOB: 10-Oct-1955 MRN: [id number]   Date of Service  12/31/2021  HPI/Events of Note  Pt with increasing pressor requirements.   eICU Interventions  Increase levophed max dose to 100mcg/min.  Continue epinephrine, neosynephrine and vasopressin.      Intervention Category Intermediate Interventions: Hypotension - evaluation and management  Larinda Buttery 12/23/2021, 2:26 AM

## 2022-01-13 NOTE — IPAL (Signed)
  Interdisciplinary Goals of Care Family Meeting   Date carried out:: 12/20/2021  Location of the meeting: Unit  Member's involved: Physician, Bedside Registered Nurse, and Family Member or next of kin  Durable Power of Attorney or acting medical decision maker: Son and daughter  Discussion: We discussed goals of care for [name] .  They understand how severely ill she has.  They were called in overnight and have been here since.  They have seen her ongoing decline.  They would like to proceed with transitioning to comfort care and terminally extubating.  CODE STATUS changed to DNR.  Code status: Full DNR  Disposition: In-patient comfort care   Time spent for the meeting: 15 min.  [name] 12/21/2021, 10:34 AM

## 2022-01-13 NOTE — Progress Notes (Signed)
Was at bedside to update family  Continues to struggle despite optimal support with pressors  [place]look is poor as not able to maintain MAP of 65  On CRRT On vent Multiorgan dysfunction  Prognosis is grim Continue aggressive measures

## 2022-01-13 NOTE — Progress Notes (Signed)
Time of death 1051. Verified by myself and Ledon Snare, RN. MD made aware. Family was at bedside. Per CDS patient was not a candidate for donation. Post mortem flowsheet completed and patient placement made aware. All patient belongings given to family.

## 2022-01-13 NOTE — Progress Notes (Addendum)
Patient ID: Stacey Burns, female   DOB: 1955-07-24, 66 y.o.   MRN: [id number] [facility] Progress Note   Assessment/ Plan:   1.  Acute kidney injury: Anuric and appears to have been initially what appears to have been a prerenal state and likely involved ischemic ATN in the setting of her hypotension/sepsis.  Anticipate some additional injury from iodinated intravenous contrast exposure.  We will continue CRRT at the current prescription with poor clinical outlook/prognosis with high risk of mortality within the next 24 hours noted. 2.  Combined non-anion gap/gap metabolic acidosis: Secondary to GI losses and acute kidney injury for which we will continue her on CRRT. 3.  Hyperkalemia: Secondary to acute kidney injury, corrected with CRRT and improving metabolic acidosis.Marland Kitchen 4.  Severe sepsis/septic shock: Suspected to have intra-abdominal source of sepsis and currently on broad-spectrum antimicrobial therapy with vancomycin, ceftriaxone and metronidazole in the setting of preceding immunosuppression with azathioprine.  Ongoing source identification and remains on pressors with high risk of imminent mortality. 5.  Shock liver: Secondary to sepsis/hypotension, continue to manage supportively with serial lab surveillance. 6.  History of SLE: On immunosuppression with azathioprine and not on corticosteroids prior to admission. 7.  Acute hypoxic respiratory failure: Secondary to severe sepsis and likely compounded by tachypnea of metabolic acidosis-now intubated/ventilator dependent.  Subjective:   With significant hemodynamic instability overnight and evidence of multiorgan dysfunction.  Family aware of poor prognosis.   Objective:   BP (!) 81/50 Comment: aline  Pulse (!) 105   Temp (!) 89.8 F (32.1 C)   Resp (!) 0   Ht 5\' 4"  (1.626 m)   Wt 116.7 kg   SpO2 100%   BMI 44.16 kg/m   Intake/Output Summary (Last 24 hours) at Jan 11, 2022 0720 Last data filed at 01/11/2022  0700 Gross per 24 hour  Intake 8373.43 ml  Output 373 ml  Net 8000.43 ml   Weight change:   Physical Exam: Gen: Intubated, sedated, family at bedside. CVS: Pulse regular tachycardia, S1 and S2 normal Resp: Anteriorly clear to auscultation, no rales/rhonchi Abd: Soft, obese, nontender, bowel sounds normal Ext: No lower extremity edema  Imaging: DG CHEST PORT 1 VIEW  Result Date: 2022/01/11 CLINICAL DATA:  Check endotracheal tube placement EXAM: PORTABLE CHEST 1 VIEW COMPARISON:  Film from the previous day. FINDINGS: Endotracheal tube has been withdrawn and now lies 4 cm above the carina. 10/8 dialysis catheter is again seen on the left and stable. Pacing device is noted. Right jugular central line is noted extending into the mid superior vena cava. Gastric catheter again extends into a large hiatal hernia. No focal infiltrate is seen. IMPRESSION: Tubes and lines as described above stable from the prior exam. Large hiatal hernia. Electronically Signed   By: Inez Catalina M.D.   On: 01-11-22 00:21   DG CHEST PORT 1 VIEW  Result Date: 12/16/2021 CLINICAL DATA:  Endotracheal tube placement EXAM: PORTABLE CHEST 1 VIEW COMPARISON:  12/16/2021 FINDINGS: Support Apparatus: --Endotracheal tube: Tip 0.5 cm above the inferior margin of the carina. --Enteric tube:Within the intrathoracic portion of large hiatal hernia. --Vascular catheter(s):Left IJ approach central venous catheter tip in the lower left brachiocephalic vein. Right IJ approach catheter tip in the mid SVC. --Other: None Left retrocardiac consolidation/atelectasis. IMPRESSION: Endotracheal tube tip 0.5 cm above the inferior margin of the carina. Recommend retracting by 2-3 cm. Electronically Signed   By: Ulyses Jarred M.D.   On: 12/16/2021 20:38   ECHOCARDIOGRAM COMPLETE  Result Date: 12/16/2021  ECHOCARDIOGRAM REPORT   Patient Name:   Stacey Burns Date of Exam: 12/16/2021 Medical Rec #:  [id number]            Height:       64.0 in  Accession #:    [id number]           Weight:       257.3 lb Date of Birth:  12-15-1955            BSA:          2.177 m Patient Age:    67 years             BP:           132/115 mmHg Patient Gender: F                    HR:           73 bpm. Exam Location:  Inpatient Procedure: 2D Echo, Cardiac Doppler and Color Doppler Indications:     shock  History:         Patient has prior history of Echocardiogram examinations, most                  recent 11/25/2021. Pacemaker and Abnormal ECG; Risk                  Factors:Diabetes, Dyslipidemia and Hypertension.  Sonographer:     Melissa Morford RDCS (AE, PE) Referring Phys:  [place] Diagnosing Phys: Vernell Leep MD IMPRESSIONS  1. Left ventricular ejection fraction, by estimation, is 55 to 60%. The left ventricle has normal function. The left ventricle has no regional wall motion abnormalities. Left ventricular diastolic parameters are consistent with Grade I diastolic dysfunction (impaired relaxation).  2. Right ventricular systolic function is normal. The right ventricular size is normal.  3. The mitral valve is normal in structure. No evidence of mitral valve regurgitation. No evidence of mitral stenosis.  4. The aortic valve is normal in structure. Aortic valve regurgitation is not visualized. No aortic stenosis is present. Comparison(s): Compared to outpatient study in 11/2021, mild TR, TR not well appreciated. No otehr significant change noted. FINDINGS  Left Ventricle: Left ventricular ejection fraction, by estimation, is 55 to 60%. The left ventricle has normal function. The left ventricle has no regional wall motion abnormalities. The left ventricular internal cavity size was normal in size. There is  no left ventricular hypertrophy. Left ventricular diastolic parameters are consistent with Grade I diastolic dysfunction (impaired relaxation). Normal left ventricular filling pressure. Right Ventricle: The right ventricular size is normal. No increase  in right ventricular wall thickness. Right ventricular systolic function is normal. Left Atrium: Left atrial size was normal in size. Right Atrium: Right atrial size was normal in size. Pericardium: There is no evidence of pericardial effusion. Mitral Valve: The mitral valve is normal in structure. No evidence of mitral valve regurgitation. No evidence of mitral valve stenosis. Tricuspid Valve: The tricuspid valve is normal in structure. Tricuspid valve regurgitation is not demonstrated. No evidence of tricuspid stenosis. Aortic Valve: The aortic valve is normal in structure. Aortic valve regurgitation is not visualized. No aortic stenosis is present. Aortic valve mean gradient measures 5.0 mmHg. Aortic valve peak gradient measures 13.7 mmHg. Aortic valve area, by VTI measures 2.64 cm. Pulmonic Valve: The pulmonic valve was normal in structure. Pulmonic valve regurgitation is not visualized. No evidence of pulmonic stenosis. Aorta: The aortic root is normal in  size and structure. IAS/Shunts: No atrial level shunt detected by color flow Doppler. Additional Comments: A device lead is visualized.  LEFT VENTRICLE PLAX 2D LVIDd:         4.30 cm   Diastology LVIDs:         3.30 cm   LV e' medial:    5.77 cm/s LV PW:         1.00 cm   LV E/e' medial:  18.7 LV IVS:        0.90 cm   LV e' lateral:   6.09 cm/s LVOT diam:     1.90 cm   LV E/e' lateral: 17.7 LV SV:         68 LV SV Index:   31 LVOT Area:     2.84 cm  LEFT ATRIUM           Index LA diam:      2.30 cm 1.06 cm/m LA Vol (A4C): 19.7 ml 9.05 ml/m  AORTIC VALVE AV Area (Vmax):    2.10 cm AV Area (Vmean):   2.66 cm AV Area (VTI):     2.64 cm AV Vmax:           185.00 cm/s AV Vmean:          95.200 cm/s AV VTI:            0.257 m AV Peak Grad:      13.7 mmHg AV Mean Grad:      5.0 mmHg LVOT Vmax:         137.00 cm/s LVOT Vmean:        89.400 cm/s LVOT VTI:          0.239 m LVOT/AV VTI ratio: 0.93  AORTA Ao Root diam: 2.80 cm Ao Asc diam:  2.90 cm MITRAL VALVE MV  Area (PHT): 3.45 cm     SHUNTS MV Decel Time: 220 msec     Systemic VTI:  0.24 m MV E velocity: 108.00 cm/s  Systemic Diam: 1.90 cm MV A velocity: 87.90 cm/s MV E/A ratio:  1.23 Manish Patwardhan MD Electronically signed by Vernell Leep MD Signature Date/Time: 12/16/2021/5:34:09 PM    Final    DG CHEST PORT 1 VIEW  Result Date: 12/16/2021 CLINICAL DATA:  Evaluate central venous catheter placement EXAM: PORTABLE CHEST 1 VIEW COMPARISON:  Earlier today FINDINGS: There is a right IJ catheter with tip in the projection of the cavoatrial junction. Larger bore left IJ catheter has been placed with tip at the confluence of the SVC and innominate vein. No pneumothorax identified. Very large hiatal hernia is again noted. Enteric 2 appears within the intrathoracic stomach. Left chest wall pacer device noted with leads in the right atrial appendage and right ventricle. Similar appearance of small bilateral pleural effusions and pulmonary vascular congestion. Patchy densities within the left midlung and left base appear increased from the previous exam and may represent areas of atelectasis or developing airspace disease. IMPRESSION: 1. Interval placement of left IJ catheter with tip at the confluence of the SVC and innominate vein. 2. Similar appearance of small pleural effusions with pulmonary vascular congestion. 3. Patchy opacities identified within the left midlung and left base may represent atelectasis or developing airspace disease. Electronically Signed   By: Kerby Moors M.D.   On: 12/16/2021 12:31   DG CHEST PORT 1 VIEW  Result Date: 12/16/2021 CLINICAL DATA:  Check central line placement EXAM: PORTABLE CHEST 1 VIEW COMPARISON:  Film from the previous  day. FINDINGS: Cardiac shadow is enlarged but stable. Aortic calcifications are seen. Pacing device is again noted. Gastric catheter has been advanced into the stomach although a large hiatal hernia is noted. Right jugular central line is noted extending  into the superior right atrium. No pneumothorax is seen. Mild vascular congestion is noted. IMPRESSION: No pneumothorax following central line placement. Large hiatal hernia with gastric catheter within. Mild vascular congestion. Electronically Signed   By: Alcide Clever M.D.   On: 12/16/2021 03:53   DG Abd 1 View  Result Date: 12/16/2021 CLINICAL DATA:  NG tube placement. EXAM: ABDOMEN - 1 VIEW COMPARISON:  Dec 24, 2021. FINDINGS: Examination is limited due to single view obtained. The bowel gas pattern is normal. An enteric tube terminates at the right lung base in the region of a known large hiatal hernia. No renal calculi. Surgical clips are noted in the right upper quadrant. A pacemaker device is present over the left chest. Mild strandy atelectasis is noted at the lung bases. IMPRESSION: Enteric tube terminates at the right lung base in the region of known large hiatal hernia. Electronically Signed   By: Thornell Sartorius M.D.   On: 12/16/2021 01:52   CT Angio Abd/Pel w/ and/or w/o  Result Date: Dec 24, 2021 CLINICAL DATA:  Mesenteric ischemia, acute. Evaluate ischemic colitis. EXAM: CTA ABDOMEN AND PELVIS WITHOUT AND WITH CONTRAST TECHNIQUE: Multidetector CT imaging of the abdomen and pelvis was performed using the standard protocol during bolus administration of intravenous contrast. Multiplanar reconstructed images and MIPs were obtained and reviewed to evaluate the vascular anatomy. RADIATION DOSE REDUCTION: This exam was performed according to the departmental dose-optimization program which includes automated exposure control, adjustment of the mA and/or kV according to patient size and/or use of iterative reconstruction technique. CONTRAST:  OMNIPAQUE IOHEXOL 350 MG/ML SOLN COMPARISON:  Noncontrast CT earlier today. FINDINGS: VASCULAR Aorta: Normal caliber aorta without aneurysm, dissection, vasculitis or significant stenosis. Mild to moderate atherosclerosis. Celiac: Patent without evidence of  aneurysm, dissection, vasculitis or significant stenosis. The splenic artery may arise directly from the upper abdominal aorta and is diminutive. SMA: Patent without evidence of aneurysm, dissection, vasculitis or significant stenosis. Renals: Small in caliber. No severe stenosis. Early branching of the right renal artery. IMA: Poorly opacified, unclear if this is due to small size. There is no evidence of embolic disease. Inflow: Patent without evidence of aneurysm, dissection, vasculitis or significant stenosis. Proximal Outflow: Bilateral common femoral and visualized portions of the superficial and profunda femoral arteries are patent without evidence of aneurysm, dissection, vasculitis or significant stenosis. Vessels are small in caliber. Veins: No acute findings on venous phase imaging. No portal venous gas. Review of the MIP images confirms the above findings. NON-VASCULAR Lower chest: Again seen large hiatal hernia. The entire chest was evaluated on CT earlier today. No significant change from that exam. Hepatobiliary: No focal hepatic abnormality cholecystectomy without biliary dilatation. Pancreas: Faint peripancreatic edema is again seen. No ductal dilatation or evidence of focal pancreatic abnormality Spleen: Normal in size without focal abnormality. Adrenals/Urinary Tract: No adrenal nodule. No hydronephrosis. No focal renal abnormality. Urinary bladder is empty. Stomach/Bowel: The majority of the stomach is intrathoracic. Dependent density in the stomach is felt to be related to ingested material rather than active GI bleed, and is unchanged on arterial and venous phase. The entire stomach is not included in the field of view, but was seen on chest CT earlier today. Ingested material within the descending colon does not represent active GI hemorrhage  in was seen on noncontrast exam earlier today, series 5, image 71. There is possible wall thickening of the cecum and proximal ascending colon, series 5  images 56 and 66, similar pericolonic edema to earlier today in this region. There is no definite colonic inflammation at the splenic flexure in a location typical for ischemic colitis. Scattered colonic diverticula, without focal diverticulitis. Normal appendix. No small bowel inflammation. No bowel pneumatosis. Lymphatic: No abdominopelvic adenopathy. Reproductive: Small calcified uterine fibroids. Quiescent ovaries, no adnexal mass. Other: Mild fat stranding about the mesentery and retroperitoneum. No ascites or free air. No abdominal wall hernia. Musculoskeletal: Stable lumbar degenerative change. No acute findings. IMPRESSION: 1. Aortic atherosclerosis. Small caliber aortic branches including the mesenteric arteries. The IMA is poorly opacified, likely due to small caliber rather than occlusion. No evidence of embolic disease. There are no findings in the IMA vascular distribution of the colon to suggest ischemic colitis. 2. Questionable wall thickening of the cecum and proximal ascending colon in the area of previous pericolonic edema. Favor mild colitis. Location is typical of infectious or inflammatory process. There is no definite colonic inflammation at the splenic flexure in a location typical for ischemic colitis. 3. Colonic diverticulosis. 4. Large hiatal hernia with the entire stomach being intrathoracic. The entire stomach is not included in the field of view, but was included on chest CT earlier today. 5. Faint peripancreatic edema is again seen, nonspecific but can be seen in the setting of acute pancreatitis. Recommend correlation with pancreatic enzymes. Aortic Atherosclerosis (ICD10-I70.0). Electronically Signed   By: Keith Rake M.D.   On: 12/16/2021 20:30   US Abdomen Limited RUQ (LIVER/GB)  Result Date: 01/04/2022 CLINICAL DATA:  Elevated total bilirubin levels. EXAM: ULTRASOUND ABDOMEN LIMITED RIGHT UPPER QUADRANT COMPARISON:  CT from earlier today FINDINGS: Gallbladder: Status post  cholecystectomy. Common bile duct: Diameter: 3.4 mm.  No signs of intrahepatic bile duct dilatation. Liver: No focal lesion identified. Within normal limits in parenchymal echogenicity. Portal vein is patent on color Doppler imaging with normal direction of blood flow towards the liver. Other: Exam detail diminished secondary to body habitus. IMPRESSION: 1. No acute findings.  No bile duct dilatation identified. 2. Status post cholecystectomy. Electronically Signed   By: Kerby Moors M.D.   On: 12/24/2021 14:45   CT CHEST ABDOMEN PELVIS WO CONTRAST  Result Date: 12/13/2021 CLINICAL DATA:  Abnormal chest radiograph. Patient's symptoms including abdominal pain and possible sepsis. EXAM: CT CHEST, ABDOMEN AND PELVIS WITHOUT CONTRAST TECHNIQUE: Multidetector CT imaging of the chest, abdomen and pelvis was performed following the standard protocol without IV contrast. RADIATION DOSE REDUCTION: This exam was performed according to the departmental dose-optimization program which includes automated exposure control, adjustment of the mA and/or kV according to patient size and/or use of iterative reconstruction technique. COMPARISON:  Chest radiograph of 12/27/2021. Chest abdomen and pelvic CTs of 01/15/2019. FINDINGS: CT CHEST FINDINGS Cardiovascular: Right atrial and ventricular pacer. Aortic atherosclerosis. Mild cardiomegaly, without pericardial effusion. Lad coronary artery calcification. Mediastinum/Nodes: No mediastinal or definite hilar adenopathy, given limitations of unenhanced CT. Large hiatal hernia, with nearly the entire stomach positioned in the low right chest. Similar in size to on the prior. No complicating obstruction. Increased density within the herniated fat is mild, including on 40/3. Lungs/Pleura: Trace right pleural fluid is similar. Right lower lobe dependent atelectasis and volume loss, slightly increased since 2020. No lobar consolidation. Musculoskeletal: No acute osseous abnormality. CT  ABDOMEN PELVIS FINDINGS Hepatobiliary: Normal noncontrast appearance of the liver. Cholecystectomy,  without biliary ductal dilatation. Pancreas: No pancreatic duct dilatation. Subtle edema within the peripancreatic fat including on 54/3. Spleen: Normal in size, without focal abnormality. Adrenals/Urinary Tract: Normal adrenal glands. No renal calculi or hydronephrosis. No hydroureter or ureteric calculi. No bladder calculi. Stomach/Bowel: Primarily herniated into the lower chest, as detailed above. Scattered colonic diverticula. Suspect somewhat more focal edema adjacent the ascending colon including on 72/3. Normal terminal ileum and appendix.  Normal small bowel. Vascular/Lymphatic: Aortic atherosclerosis. No abdominopelvic adenopathy. Reproductive: Calcified uterine fibroids. Other: No significant free fluid.  No free intraperitoneal air. Musculoskeletal: Osteopenia.  Degenerate disc disease at L4-5. IMPRESSION: 1. Decreased sensitivity exam secondary to lack of oral or IV contrast. 2. Relatively diffuse intraabdominal edema, decreasing sensitivity for focal areas of inflammation. 3. Large hiatal hernia, with the majority of the stomach positioned in the lower chest. Subtle edema within the herniated fat is nonspecific, especially given above diffuse edema. Correlate with symptoms of inflammation or even ischemia of the herniated stomach. 4. Similarly, mild peripancreatic edema for which pancreatitis cannot be excluded. 5. More focal edema about the ascending colon could represent colitis or (given scattered diverticula) diverticulitis. 6. Small volume uterine fibroids. 7. Coronary artery atherosclerosis. Aortic Atherosclerosis (ICD10-I70.0). Electronically Signed   By: Jeronimo Greaves M.D.   On: 01/06/2022 12:04   DG Chest Port 1 View  Result Date: 06-Jan-2022 CLINICAL DATA:  Abdominal pain, possible sepsis EXAM: PORTABLE CHEST 1 VIEW COMPARISON:  10/14/2021 FINDINGS: Dual lead pacer noted. Increased appreciable  prominence of abnormal right retrocardiac and pericardiac density, most likely attributable to the large hiatal hernia shown on 01/15/2019. Atherosclerotic calcification of the aortic arch. Underlying mild to moderate enlargement of the cardiopericardial silhouette. The lungs appear otherwise clear. IMPRESSION: 1. Increased prominence of right retrocardiac density. While most likely attributable to the patient's large right eccentric hiatal hernia, I cannot exclude superimposed pneumonia or underlying lesion medially in the right lung base. This could be further characterized with chest CT if clinically warranted. 2.  Aortic Atherosclerosis (ICD10-I70.0). 3. At least mild enlargement of the cardiopericardial silhouette, without pulmonary edema. 4. Dual lead pacer. Electronically Signed   By: Gaylyn Rong M.D.   On: Jan 06, 2022 09:34    Labs: BMET Recent Labs  Lab Jan 06, 2022 1008 Jan 06, 2022 1024 01/06/2022 1551 Jan 06, 2022 2346 12/16/21 0008 12/16/21 0343 12/16/21 0744 12/16/21 1406 12/16/21 1505 12/16/21 1920 12/16/21 2212 12/16/21 2354 12/13/2021 0252 01/09/2022 0403  NA 137 137 138 136   < > 138   < > 138 141 136 139 135 134* 135  K 5.8* 5.5* 5.8* 5.7*   < > 5.4*   < > 6.0* 6.4* 5.0 4.5 4.2 4.8 4.7  CL 112* 110 114* 113*  --  109  --   --  102  --  94*  --   --  91*  CO2 13*  --  11* 10*  --  10*  --   --  11*  --  14*  --   --  17*  GLUCOSE 89 81 89 71  --  136*  --   --  142*  --  150*  --   --  62*  BUN 42* 51* 39* 43*  --  45*  --   --  40*  --  31*  --   --  24*  CREATININE 5.07* 5.60* 4.91* 5.20*  --  5.48*  --   --  4.89*  --  3.89*  --   --  3.04*  CALCIUM 7.2*  --  6.9* 6.5*  --  6.8*  --   --  6.8*  --  5.7*  --   --  5.6*  PHOS  --   --   --   --   --   --   --   --  8.9*  --   --   --   --  6.8*   < > = values in this interval not displayed.   CBC Recent Labs  Lab 01/07/2022 1008 12/21/2021 1024 12/16/21 0343 12/16/21 0744 12/16/21 0905 12/16/21 1406 12/16/21 1505  12/16/21 1920 12/16/21 2212 12/16/21 2354 2021/12/26 0252 26-Dec-2021 0403  WBC 4.8  --  5.4  --   --   --  5.1  --  3.8*  --   --   --   NEUTROABS 3.6  --   --   --   --   --   --   --   --   --   --   --   HGB 10.5*   < > 10.4*   < >  --    < > 9.0* 8.5* 8.9* 9.2* 8.8*  --   HCT 31.6*   < > 29.9*   < >  --    < > 27.6* 25.0* 26.4* 27.0* 26.0*  --   MCV 106.4*  --  105.7*  --   --   --  110.4*  --  107.3*  --   --   --   PLT 95*  --  93*  --  100*  --  86*  85*  --  66*  69*  --   --  54*   < > = values in this interval not displayed.    Medications:     sodium chloride   Intravenous Once   arformoterol  15 mcg Nebulization BID   budesonide (PULMICORT) nebulizer solution  0.25 mg Nebulization BID   Chlorhexidine Gluconate Cloth  6 each Topical Daily   docusate  100 mg Per Tube BID   fentaNYL (SUBLIMAZE) injection  50 mcg Intravenous Once   insulin aspart  0-15 Units Subcutaneous Q4H   methylPREDNISolone (SOLU-MEDROL) injection  125 mg Intravenous BID   mouth rinse  15 mL Mouth Rinse Q2H   mouth rinse  15 mL Mouth Rinse Q2H   pantoprazole (PROTONIX) IV  40 mg Intravenous Q24H   polyethylene glycol  17 g Per Tube Daily   revefenacin  175 mcg Nebulization Daily   sodium bicarbonate       sodium chloride flush  10-40 mL Intracatheter Q12H   vancomycin variable dose per unstable renal function (pharmacist dosing)   Does not apply See admin instructions   Elmarie Shiley, MD 12/26/2021, 7:20 AM

## 2022-01-13 NOTE — Procedures (Signed)
Extubation Procedure Note  Patient Details:   Name: Stacey Burns DOB: 1956-02-07 MRN: [id number]   Airway Documentation:    Vent end date: 2021-12-21 Vent end time: 1040   Evaluation  Pt extubated per withdrawal of life protocol   Carolan Shiver December 21, 2021, 10:41 AM

## 2022-01-13 DEATH — deceased

## 2022-01-15 ENCOUNTER — Encounter

## 2022-01-19 ENCOUNTER — Encounter: Admitting: Cardiology

## 2022-01-27 ENCOUNTER — Ambulatory Visit: Payer: Medicare Other | Admitting: Cardiology

## 2022-04-16 ENCOUNTER — Encounter

## 2022-07-16 ENCOUNTER — Encounter

## 2022-10-15 ENCOUNTER — Encounter

## 2023-01-14 ENCOUNTER — Encounter

## 2023-02-09 NOTE — Progress Notes (Signed)
No action done
